# Patient Record
Sex: Male | Born: 1945
Health system: Southern US, Community
[De-identification: ages and names within clinical notes are randomized; demographics above are authoritative.]

## PROBLEM LIST (undated history)

## (undated) ENCOUNTER — Emergency Department (HOSPITAL_COMMUNITY): Discharge status: Absent without leave | Payer: No Typology Code available for payment source

## (undated) DIAGNOSIS — H269 Unspecified cataract: Secondary | ICD-10-CM

## (undated) DIAGNOSIS — G43909 Migraine, unspecified, not intractable, without status migrainosus: Secondary | ICD-10-CM

## (undated) DIAGNOSIS — K449 Diaphragmatic hernia without obstruction or gangrene: Secondary | ICD-10-CM

## (undated) DIAGNOSIS — T8859XA Other complications of anesthesia, initial encounter: Secondary | ICD-10-CM

## (undated) DIAGNOSIS — K219 Gastro-esophageal reflux disease without esophagitis: Secondary | ICD-10-CM

## (undated) DIAGNOSIS — I680 Cerebral amyloid angiopathy: Secondary | ICD-10-CM

## (undated) DIAGNOSIS — K5792 Diverticulitis of intestine, part unspecified, without perforation or abscess without bleeding: Secondary | ICD-10-CM

## (undated) DIAGNOSIS — D649 Anemia, unspecified: Secondary | ICD-10-CM

## (undated) DIAGNOSIS — K5790 Diverticulosis of intestine, part unspecified, without perforation or abscess without bleeding: Secondary | ICD-10-CM

## (undated) DIAGNOSIS — Z860101 Personal history of adenomatous and serrated colon polyps: Secondary | ICD-10-CM

## (undated) DIAGNOSIS — E039 Hypothyroidism, unspecified: Secondary | ICD-10-CM

## (undated) DIAGNOSIS — M1990 Unspecified osteoarthritis, unspecified site: Secondary | ICD-10-CM

## (undated) DIAGNOSIS — K222 Esophageal obstruction: Secondary | ICD-10-CM

## (undated) DIAGNOSIS — Z86010 Personal history of colonic polyps: Secondary | ICD-10-CM

## (undated) DIAGNOSIS — T4145XA Adverse effect of unspecified anesthetic, initial encounter: Secondary | ICD-10-CM

## (undated) HISTORY — PX: POLYPECTOMY: SHX149

## (undated) HISTORY — DX: Diverticulitis of intestine, part unspecified, without perforation or abscess without bleeding: K57.92

## (undated) HISTORY — PX: MENISCUS REPAIR: SHX5179

## (undated) HISTORY — DX: Unspecified cataract: H26.9

## (undated) HISTORY — DX: Unspecified osteoarthritis, unspecified site: M19.90

## (undated) HISTORY — DX: Hypothyroidism, unspecified: E03.9

## (undated) HISTORY — PX: SHOULDER SURGERY: SHX246

## (undated) HISTORY — DX: Personal history of colonic polyps: Z86.010

## (undated) HISTORY — DX: Migraine, unspecified, not intractable, without status migrainosus: G43.909

## (undated) HISTORY — PX: KNEE SURGERY: SHX244

## (undated) HISTORY — PX: CATARACT EXTRACTION: SUR2

## (undated) HISTORY — DX: Gastro-esophageal reflux disease without esophagitis: K21.9

## (undated) HISTORY — DX: Esophageal obstruction: K22.2

## (undated) HISTORY — DX: Diverticulosis of intestine, part unspecified, without perforation or abscess without bleeding: K57.90

## (undated) HISTORY — DX: Personal history of adenomatous and serrated colon polyps: Z86.0101

## (undated) HISTORY — DX: Diaphragmatic hernia without obstruction or gangrene: K44.9

## (undated) HISTORY — DX: Anemia, unspecified: D64.9

## (undated) HISTORY — PX: UPPER GASTROINTESTINAL ENDOSCOPY: SHX188

## (undated) HISTORY — PX: COLONOSCOPY: SHX174

---

## 2003-05-08 ENCOUNTER — Ambulatory Visit (HOSPITAL_COMMUNITY): Admission: RE | Admit: 2003-05-08 | Discharge: 2003-05-08 | Payer: Self-pay | Admitting: Orthopedic Surgery

## 2003-05-08 ENCOUNTER — Ambulatory Visit: Admission: RE | Admit: 2003-05-08 | Discharge: 2003-05-08 | Payer: Self-pay | Admitting: Orthopedic Surgery

## 2003-05-16 ENCOUNTER — Ambulatory Visit (HOSPITAL_COMMUNITY): Admission: RE | Admit: 2003-05-16 | Discharge: 2003-05-16 | Payer: Self-pay | Admitting: Orthopedic Surgery

## 2003-05-16 ENCOUNTER — Ambulatory Visit (HOSPITAL_BASED_OUTPATIENT_CLINIC_OR_DEPARTMENT_OTHER): Admission: RE | Admit: 2003-05-16 | Discharge: 2003-05-16 | Payer: Self-pay | Admitting: Orthopedic Surgery

## 2005-05-04 ENCOUNTER — Encounter: Admission: RE | Admit: 2005-05-04 | Discharge: 2005-05-04 | Payer: Self-pay | Admitting: Family Medicine

## 2005-05-04 ENCOUNTER — Encounter (INDEPENDENT_AMBULATORY_CARE_PROVIDER_SITE_OTHER): Payer: Self-pay | Admitting: *Deleted

## 2005-06-01 ENCOUNTER — Encounter (INDEPENDENT_AMBULATORY_CARE_PROVIDER_SITE_OTHER): Payer: Self-pay | Admitting: *Deleted

## 2005-06-01 ENCOUNTER — Ambulatory Visit (HOSPITAL_COMMUNITY): Admission: RE | Admit: 2005-06-01 | Discharge: 2005-06-01 | Payer: Self-pay | Admitting: *Deleted

## 2006-09-16 ENCOUNTER — Ambulatory Visit (HOSPITAL_COMMUNITY): Admission: RE | Admit: 2006-09-16 | Discharge: 2006-09-16 | Payer: Self-pay | Admitting: *Deleted

## 2006-09-16 ENCOUNTER — Encounter (INDEPENDENT_AMBULATORY_CARE_PROVIDER_SITE_OTHER): Payer: Self-pay | Admitting: *Deleted

## 2006-09-16 ENCOUNTER — Encounter: Payer: Self-pay | Admitting: Internal Medicine

## 2007-07-01 ENCOUNTER — Encounter: Admission: RE | Admit: 2007-07-01 | Discharge: 2007-07-01 | Payer: Self-pay | Admitting: Orthopedic Surgery

## 2008-07-12 ENCOUNTER — Encounter: Admission: RE | Admit: 2008-07-12 | Discharge: 2008-07-12 | Payer: Self-pay | Admitting: Orthopedic Surgery

## 2008-09-18 ENCOUNTER — Ambulatory Visit (HOSPITAL_COMMUNITY): Admission: RE | Admit: 2008-09-18 | Discharge: 2008-09-18 | Payer: Self-pay | Admitting: Endocrinology

## 2008-09-18 ENCOUNTER — Encounter (INDEPENDENT_AMBULATORY_CARE_PROVIDER_SITE_OTHER): Payer: Self-pay | Admitting: *Deleted

## 2009-12-08 ENCOUNTER — Encounter (INDEPENDENT_AMBULATORY_CARE_PROVIDER_SITE_OTHER): Payer: Self-pay | Admitting: *Deleted

## 2009-12-31 ENCOUNTER — Ambulatory Visit: Payer: Self-pay | Admitting: Internal Medicine

## 2009-12-31 DIAGNOSIS — Z860100 Personal history of colon polyps, unspecified: Secondary | ICD-10-CM | POA: Insufficient documentation

## 2009-12-31 DIAGNOSIS — K222 Esophageal obstruction: Secondary | ICD-10-CM

## 2009-12-31 DIAGNOSIS — K5732 Diverticulitis of large intestine without perforation or abscess without bleeding: Secondary | ICD-10-CM

## 2009-12-31 DIAGNOSIS — K219 Gastro-esophageal reflux disease without esophagitis: Secondary | ICD-10-CM

## 2010-01-27 ENCOUNTER — Ambulatory Visit: Payer: Self-pay | Admitting: Internal Medicine

## 2010-01-29 ENCOUNTER — Encounter: Payer: Self-pay | Admitting: Internal Medicine

## 2010-04-21 NOTE — Op Note (Signed)
Summary: EGD-Dr. Virginia Rochester  NAME:  Eric Case, Eric Case                ACCOUNT NO.:  [id number]   MEDICAL RECORD NO.:  [id number]          PATIENT TYPE:  AMB   LOCATION:  ENDO                         FACILITY:  [facility]   PHYSICIAN:  Georgiana Spinner, M.D.    DATE OF BIRTH:  [birth year]   DATE OF PROCEDURE:  06/01/2005  DATE OF DISCHARGE:                                 OPERATIVE REPORT   PROCEDURE:  Upper endoscopy with dilation.   INDICATIONS:  Dysphagia with abnormal barium swallow study   ANESTHESIA:  Demerol 70 Versed 7 mg.   DESCRIPTION OF PROCEDURE:  With the patient mildly sedated in the left  lateral decubitus position, the Olympus videoscopic endoscope was inserted  and passed under direct vision through the esophagus which appeared normal  until we reached the distal esophagus and there was a stricture of the  distal esophagus noted above a hiatal hernia. Additionally there was some  erythematous changes consistent with esophagitis and some erosions seen in  this area as well as 3 or 4 pseudodiverticula. This was all captured in a  photograph. We advanced through this area with the endoscope into the hiatal  hernia sac and distally fundus, body, and antrum were visualized, at which  point, the endoscope was placed in retroflexion to view the stomach from  below. The endoscope was then straightened and advanced through the pylorus  into the duodenal bulb and second portion of the duodenum. From this point,  the endoscope was slowly withdrawn taking circumferential views of duodenal  mucosa until the endoscope had been pulled back into the stomach; at which  point, a guidewire was passed; the endoscope was withdrawn.  Using  fluoroscopic control, a 16 Savary dilator was passed rather easily with it.  The guidewire was then removed. The endoscope was then reinserted to this  level; and a small amount of blood was seen in the esophagus as to be  expected biopsy; and a biopsy was taken of the  squamocolumnar junction. The  endoscope was withdrawn. The patient's vital signs and pulse oximeter  remained stable. The patient tolerated the procedure well without apparent  complications.   FINDINGS:  Distal esophageal stricture above a hiatal hernia with  pseudodiverticula and inflammatory changes noted.   PLAN:  We will have the patient on a liquid diet, for today, and advance as  tolerated. We had placed the patient on over-the-counter PPI therapy,  and  will continue that until I see him back in the office in about 6 weeks, and  will probably continue that indefinitely.  We will await biopsy reports and  clinical response.           ______________________________  Georgiana Spinner, M.D.     GMO/MEDQ  D:  06/01/2005  T:  06/02/2005  Job:  [id number]   cc:   Quita Skye. Artis Flock, M.D.  Fax: [phone]

## 2010-04-21 NOTE — Op Note (Signed)
Summary: Colonoscopy-Dr. Virginia Rochester  NAME:  Eric Case, Eric Case                ACCOUNT NO.:  [id number]      MEDICAL RECORD NO.:  [id number]          PATIENT TYPE:  AMB      LOCATION:  ENDO                         [facility]ACILITY:  [facility]      PHYSICIAN:  Georgiana Spinner, M.D.    DATE OF BIRTH:  08-03-1945      DATE OF PROCEDURE:  09/16/2006   DATE OF DISCHARGE:                                  OPERATIVE REPORT      PROCEDURE:  Colonoscopy.      INDICATIONS:  Colon cancer screening, colon polyps.      ANESTHESIA:  Demerol 75 mg, Versed 5 mg.      DESCRIPTION OF PROCEDURE:  With the patient mildly sedated in the left   lateral decubitus position, a rectal examination was performed.   Prostate was normal to my examination.  Subsequently, the Pentax   videoscopic colonoscope was inserted into the rectum and passed under   direct vision to the cecum, identified by ileocecal valve and   appendiceal orifice, both of which were photographed.  From this point,   the colonoscope was slowly withdrawn, taking circumferential views of   the colonic mucosa, stopping only first at the ascending colon near the   hepatic flexure where a small polyp was seen, photographed and removed   using hot biopsy forceps technique setting of 20/150 blended current.   We next stopped at the splenic flexure where a second polyp was seen.   It too was photographed and it was removed using snare cautery technique   with the same setting.  Tissue was retrieved by suctioning through the   endoscope into a tissue trap.  From this point, the colonoscope was then   slowly withdrawn taking circumferential views of the remaining colonic   mucosa stopping only to photograph diverticula that we saw the sigmoid   colon on the way to the rectum which appeared normal on direct and   showed hemorrhoids on retroflexed view.  The endoscope was straightened   and withdrawn.  The patient's vital signs and pulse oximeter remained   stable.  The patient  tolerated the procedure well without apparent   complication.      FINDINGS:   1. Internal hemorrhoids.   2. Diverticulosis of sigmoid colon, mild.   3. Polyp at splenic flexure and ascending colon.      PLAN:  Await biopsy report.  The patient will call me for results and   follow-up with me as needed as an outpatient.                  ______________________________   Georgiana Spinner, M.D.            GMO/MEDQ  D:  09/16/2006  T:  09/16/2006  Job:  [id number]      cc:   Quita Skye. Artis Flock, M.D.   Fax: [phone]

## 2010-04-21 NOTE — Letter (Signed)
Summary: New Patient letter  [facility]  [address] [place], [place] [address]   Phone: [phone]  Fax: [phone]       12/08/2009 MRN: [id number]  Eric Case [name] [place], [place]  [address]  Dear Eric Case,  Welcome to the Gastroenterology Division at [facility].    You are scheduled to see Dr.  Yancey Flemings on December 31, 2009 at 9:15am on the 3rd floor at [facility], [address]  We ask that you try to arrive at our office 15 minutes prior to your appointment time to allow for check-in.  We would like you to complete the enclosed self-administered evaluation form prior to your visit and bring it with you on the day of your appointment.  We will review it with you.  Also, please bring a complete list of all your medications or, if you prefer, bring the medication bottles and we will list them.  Please bring your insurance card so that we may make a copy of it.  If your insurance requires a referral to see a specialist, please bring your referral form from your primary care physician.  Co-payments are due at the time of your visit and may be paid by cash, check or credit card.     Your office visit will consist of a consult with your physician (includes a physical exam), any laboratory testing he/she may order, scheduling of any necessary diagnostic testing (e.g. x-ray, ultrasound, CT-scan), and scheduling of a procedure (e.g. Endoscopy, Colonoscopy) if required.  Please allow enough time on your schedule to allow for any/all of these possibilities.    If you cannot keep your appointment, please call [phone] to cancel or reschedule prior to your appointment date.  This allows [place] the opportunity to schedule an appointment for another patient in need of care.  If you do not cancel or reschedule by 5 p.m. the business day prior to your appointment date, you will be charged a $50.00 late cancellation/no-show fee.    Thank you for  choosing [facility] for your medical needs.  We appreciate the opportunity to care for you.  Please visit [place] at our website  to learn more about our practice.                     Sincerely,                                                             The Gastroenterology Division

## 2010-04-21 NOTE — Letter (Signed)
Summary: Patient Notice- Polyp Results  [facility]  [address] [place], [place] [address]   Phone: [phone]  Fax: [phone]        January 29, 2010 MRN: [id number]    Eric Case [name] [place], [place]  [address]    Dear [name],  I am pleased to inform you that the colon polyp(s) removed during your recent colonoscopy was (were) found to be benign (no cancer detected) upon pathologic examination.  I recommend you have a repeat colonoscopy examination in 5 years to look for recurrent polyps, as having colon polyps increases your risk for having recurrent polyps or even colon cancer in the future.  Should you develop new or worsening symptoms of abdominal pain, bowel habit changes or bleeding from the rectum or bowels, please schedule an evaluation with either your primary care physician or with me.  Additional information/recommendations:  __ No further action with gastroenterology is needed at this time. Please      follow-up with your primary care physician for your other healthcare      needs.    Please call us if you are having persistent problems or have questions about your condition that have not been fully answered at this time.  Sincerely,  Hilarie Fredrickson MD  This letter has been electronically signed by your physician.  Appended Document: Patient Notice- Polyp Results letter mailed 11.15.2011

## 2010-04-21 NOTE — Letter (Signed)
Summary: [facility] Instructions  [facility]  [address] [place], [place] [address]   Phone: [phone]  Fax: [phone]       Eric Case    June 19, 1945    MRN: [id number]        Procedure Day /Date:TUESDAY, 01/27/10     Arrival Time:9:00 AM     Procedure Time:10:00 AM     Location of Procedure:                    X  [facility] (4th Floor)                        PREPARATION FOR COLONOSCOPY WITH MOVIPREP   Starting 5 days prior to your procedure 01/22/10 do not eat nuts, seeds, popcorn, corn, beans, peas,  salads, or any raw vegetables.  Do not take any fiber supplements (e.g. Metamucil, Citrucel, and Benefiber).  THE DAY BEFORE YOUR PROCEDURE         DATE: 01/26/10 ONG:EXBMWU  1.  Drink clear liquids the entire day-NO SOLID FOOD  2.  Do not drink anything colored red or purple.  Avoid juices with pulp.  No orange juice.  3.  Drink at least 64 oz. (8 glasses) of fluid/clear liquids during the day to prevent dehydration and help the prep work efficiently.  CLEAR LIQUIDS INCLUDE: Water Jello Ice Popsicles Tea (sugar ok, no milk/cream) Powdered fruit flavored drinks Coffee (sugar ok, no milk/cream) Gatorade Juice: apple, white grape, white cranberry  Lemonade Clear bullion, consomm, broth Carbonated beverages (any kind) Strained chicken noodle soup Hard Candy                             4.  In the morning, mix first dose of MoviPrep solution:    Empty 1 Pouch A and 1 Pouch B into the disposable container    Add lukewarm drinking water to the top line of the container. Mix to dissolve    Refrigerate (mixed solution should be used within 24 hrs)  5.  Begin drinking the prep at 5:00 p.m. The MoviPrep container is divided by 4 marks.   Every 15 minutes drink the solution down to the next mark (approximately 8 oz) until the full liter is complete.   6.  Follow completed prep with 16 oz of clear liquid of your choice (Nothing red or  purple).  Continue to drink clear liquids until bedtime.  7.  Before going to bed, mix second dose of MoviPrep solution:    Empty 1 Pouch A and 1 Pouch B into the disposable container    Add lukewarm drinking water to the top line of the container. Mix to dissolve    Refrigerate  THE DAY OF YOUR PROCEDURE      DATE: 01/27/10 DAY: TUESDAY  Beginning at 5:00 a.m. (5 hours before procedure):         1. Every 15 minutes, drink the solution down to the next mark (approx 8 oz) until the full liter is complete.  2. Follow completed prep with 16 oz. of clear liquid of your choice.    3. You may drink clear liquids until 8:00 AM (2 HOURS BEFORE PROCEDURE).   MEDICATION INSTRUCTIONS  Unless otherwise instructed, you should take regular prescription medications with a small sip of water   as early as possible the morning of your procedure.  OTHER INSTRUCTIONS  You will need a responsible adult at least 63 years of age to accompany you and drive you home.   This person must remain in the waiting room during your procedure.  Wear loose fitting clothing that is easily removed.  Leave jewelry and other valuables at home.  However, you may wish to bring a book to read or  an iPod/MP3 player to listen to music as you wait for your procedure to start.  Remove all body piercing jewelry and leave at home.  Total time from sign-in until discharge is approximately 2-3 hours.  You should go home directly after your procedure and rest.  You can resume normal activities the  day after your procedure.  The day of your procedure you should not:   Drive   Make legal decisions   Operate machinery   Drink alcohol   Return to work  You will receive specific instructions about eating, activities and medications before you leave.    The above instructions have been reviewed and explained to me by   _______________________    I fully understand and can verbalize these  instructions _____________________________ Date _________

## 2010-04-21 NOTE — Procedures (Signed)
Summary: Colonoscopy  Patient: [name] Note: All result statuses are Final unless otherwise noted.  Tests: (1) Colonoscopy (COL)   COL Colonoscopy           DONE     [facility]     [address]     [place], [place]  [address]           COLONOSCOPY PROCEDURE REPORT           PATIENT:  Eric Case, Eric Case  MR#:  [id number]     BIRTHDATE:  06/29/1945, 64 yrs. old  GENDER:  male     ENDOSCOPIST:  Wilhemina Bonito. Eda Keys, MD     REF. BY:  Lavada Mesi, M.D.     PROCEDURE DATE:  01/27/2010     PROCEDURE:  Colonoscopy with snare polypectomy x 2     ASA CLASS:  Class II     INDICATIONS:  history of pre-cancerous (adenomatous) colon polyps,     surveillance and high-risk screening index exam 08-2006 (Dr Virginia Rochester) w/     small adenomas     MEDICATIONS:   Fentanyl 75 mcg IV, Versed 7 mg IV           DESCRIPTION OF PROCEDURE:   After the risks benefits and     alternatives of the procedure were thoroughly explained, informed     consent was obtained.  Digital rectal exam was performed and     revealed no abnormalities.   The [id number] endoscope was     introduced through the anus and advanced to the cecum, which was     identified by both the appendix and ileocecal valve, without     limitations.Time to cecum = 2:32 min.  The quality of the prep was     excellent, using MoviPrep.  The instrument was then slowly     withdrawn (time = 14:48 min) as the colon was fully examined.     <<PROCEDUREIMAGES>>           FINDINGS:  Two polyps (2mm, 7mm) were found in the ascending     colon. Polyps were snared without cautery. Retrieval was     successful.   Severe diverticulosis was found throughout the     colon, especially the left colon.   Retroflexed views in the     rectum revealed internal hemorrhoids.    The scope was then     withdrawn from the patient and the procedure completed.           COMPLICATIONS:  None           ENDOSCOPIC IMPRESSION:     1) Two polyps in the ascending colon  -removed     2) Severe diverticulosis throughout the colon     3) Internal hemorrhoids           RECOMMENDATIONS:     1) Follow up colonoscopy in 5 years           ______________________________     Wilhemina Bonito. Eda Keys, MD           CC:  Lavada Mesi, MDThe Patient           n.     Rosalie DoctorWilhemina Bonito. Eda Keys at 01/27/2010 11:21 AM           Roxan Diesel, [id number]  Note: An exclamation mark (!) indicates a result that was not dispersed into the flowsheet. Document Creation Date: 01/27/2010 11:22 AM  _______________________________________________________________________  (1) Order result status: Final Collection or observation date-time: 01/27/2010 11:11 Requested date-time:  Receipt date-time:  Reported date-time:  Referring Physician:   Ordering Physician: Fransico Setters [phone]) Specimen Source:  Source: Launa Grill Order Number: [id number] Lab site:   Appended Document: Colonoscopy recall 5 yrs     Procedures Next Due Date:    Colonoscopy: 01/2015

## 2010-04-21 NOTE — Assessment & Plan Note (Signed)
Summary: Establish with GI (history of polyps, diverticulitis, GERD)   History of Present Illness Visit Type: Initial Visit Primary GI MD: Yancey Flemings MD Primary Provider: Lavada Mesi, MD Chief Complaint: Patient here to discuss having a colonoscopy. He c/o episodes of LLQ abdominal pain every 2-3 months which has been treated with antibiotics by Dr Jorge Mandril.  History of Present Illness:   65 year old white male with a history of GERD complicated by peptic stricture, hypothyroidism, recurrent diverticulitis, and adenomatous colon polyps. He presents today with questions regarding recurrent diverticulitis as well as the need for followup colonoscopy. Previously under the care of Dr. Sabino Gasser. Review of outside records finds that the patient underwent upper endoscopy with esophageal dilation in March of 2007 for problems with dysphagia. Found to have a peptic stricture, esophagitis, and a pseudodiverticulum. Dilated to a maximal diameter of 16 mm. Has been on lansoprazole since. No problems with reflux symptoms were recurrent dysphagia. Next, he reports recurrent bouts of diverticulitis as manifested by left-sided abdominal pain. He describes about 6 such episodes over the past 3 years. Initially diagnosed with CT scan when he went to see his urologist for possible kidney stone. He generally responds to antibiotics within 2-3 days. His last colonoscopy was performed in June of 2008. He was said to have internal hemorrhoids, sigmoid diverticulosis, as well as several polyps in the splenic flexure and descending colon regions. These were adenomatous. He was instructed to followup in 3 years. Finally, he mentions having low iron levels. Review of outside laboratories finds a low ferritin level in May of 2010 with normal ferritin level January 2000 and and low ferritin level June 2011. However, normal hemoglobin 14.4. He is now taking an iron supplement.   GI Review of Systems    Reports abdominal pain,  belching, and  bloating.     Location of  Abdominal pain: LLQ.    Denies acid reflux, chest pain, dysphagia with liquids, dysphagia with solids, heartburn, loss of appetite, nausea, vomiting, vomiting blood, weight loss, and  weight gain.      Reports diverticulosis.     Denies anal fissure, black tarry stools, change in bowel habit, constipation, diarrhea, fecal incontinence, heme positive stool, hemorrhoids, irritable bowel syndrome, jaundice, light color stool, liver problems, rectal bleeding, and  rectal pain. Preventive Screening-Counseling & Management  Alcohol-Tobacco     Smoking Status: never  Caffeine-Diet-Exercise     Does Patient Exercise: yes      Drug Use:  no.      Current Medications (verified): 1)  Levothyroxine Sodium 112 Mcg Tabs (Levothyroxine Sodium) .... Take 1 Tablet By Mouth Once Daily 2)  Lansoprazole 30 Mg Cpdr (Lansoprazole) .... Take 1 Tablet By Mouth Once A Day 3)  Ferrous Gluconate 325 (36 Fe) Mg Tabs (Ferrous Gluconate) .... Take 1 Tablet By Mouth Once A Day 4)  Multivitamins  Tabs (Multiple Vitamin) .... Take 1 Tablet By Mouth Once A Day 5)  Vitamin C 1000 Mg Tabs (Ascorbic Acid) .... Take 1 Tablet By Mouth Once A Day 6)  Vitamin E 400 Unit Caps (Vitamin E) .... Take 1 Tablet By Mouth Once A Day 7)  Saw Palmetto 250 Mg Capsule .... Take 2 Capsules By Mouth Once Daily 8)  Salmon Oil-1000 200 Mg Caps (Omega-3 Fatty Acids) .... Take 1 Capsule By Mouth Once A Day 9)  Glucosamine 1500 Complex  Caps (Glucosamine-Chondroit-Vit C-Mn) .... Take 1 Capsule By Mouth Once A Day 10)  Msm 1000 Mg Caps (Methylsulfonylmethane) .... Take  1 Capsule By Mouth Once A Day  Allergies (verified): 1)  ! Cipro 2)  ! Septra 3)  ! Percocet 4)  ! * Pensed  Past History:  Past Medical History: Adenomatous Colon Polyps Diverticulosis Hemorrhoids Hiatal Hernia Arthritis GERD Hypothyroidism Hx Diverticulitis  Past Surgical History: Bilateral shoulder surgeries  Family  History: Family History of Breast Cancer: Mother Family History of Colon Cancer:1st cousin Family History of Liver Cancer: Father Family History of Prostate Cancer: Father, Maternal Uncles x 3  Social History: Occupation: Music therapist Patient has never smoked.  Alcohol Use - no Daily Caffeine Use-1 cup daily Illicit Drug Use - no Patient gets regular exercise. Smoking Status:  never Drug Use:  no Does Patient Exercise:  yes  Review of Systems       The patient complains of arthritis/joint pain and back pain.  The patient denies allergy/sinus, anemia, anxiety-new, blood in urine, breast changes/lumps, change in vision, confusion, cough, coughing up blood, depression-new, fainting, fatigue, fever, headaches-new, hearing problems, heart murmur, heart rhythm changes, itching, menstrual pain, muscle pains/cramps, night sweats, nosebleeds, pregnancy symptoms, shortness of breath, skin rash, sleeping problems, sore throat, swelling of feet/legs, swollen lymph glands, thirst - excessive , urination - excessive , urination changes/pain, urine leakage, vision changes, and voice change.    Vital Signs:  Patient profile:   65 year old male Height:      69 inches Weight:      197.13 pounds BMI:     29.22 BSA:     2.05 Pulse rate:   92 / minute Pulse rhythm:   regular BP sitting:   118 / 82  (right arm)  Vitals Entered By: Lamona Curl CMA Duncan Dull) (December 31, 2009 9:26 AM)  Physical Exam  General:  Well developed, well nourished, no acute distress. Head:  Normocephalic and atraumatic. Eyes:  PERRLA, no icterus. Nose:  No deformity, discharge,  or lesions. Mouth:  No deformity or lesions. Neck:  Supple; no masses or thyromegaly. Lungs:  Clear throughout to auscultation. Heart:  Regular rate and rhythm; no murmurs, rubs,  or bruits. Abdomen:  Soft, nontender and nondistended. No masses, hepatosplenomegaly or hernias noted. Normal bowel sounds. Rectal:  deferred until  colonoscopy Msk:  Symmetrical with no gross deformities. Normal posture. Pulses:  Normal pulses noted. Extremities:  No clubbing, cyanosis, edema or deformities noted. Neurologic:  Alert and  oriented x4;  grossly normal neurologically. Skin:  Intact without significant lesions or rashes. Psych:  Alert and cooperative. Normal mood and affect.   Impression & Recommendations:  Problem # 1:  PERSONAL HISTORY OF COLONIC POLYPS (ICD-V12.72) personal history of adenomatous colon polyps. Last colonoscopy June 2008. Due for followup.  Plan #1. Colonoscopy. The nature of the procedure as well as the risks, benefits, and alternatives were reviewed. He understood and agreed to proceed #2. Movi prep prescribed. The patient instructed on its use  Problem # 2:  GERD (ICD-530.81) history of GERD complicated by peptic stricture. Currently asymptomatic post dilation on lansoprazole  Plan: #1. Continue lansoprazole #2. Reflux precautions #3. Repeat esophageal dilation p.r.n. recurrent dysphagia  Problem # 3:  ESOPHAGEAL STRICTURE (ICD-530.3) status post dilation. please see #2 above.   Problem # 4:  DIVERTICULITIS OF COLON (ICD-562.11) recurrent bouts of uncomplicated diverticulitis. We discussed today, in some detail, diverticular disease. We also discussed evidence regarding dietary manipulation. Also discussed the role for surgery in some instances.  Plan: #1. High-fiber diet #2. Literature on diverticular disease to complement today's discussion #3. See medical  attention for treatment of symptoms suggesting diverticular flare, as you have in the past  Other Orders: Colonoscopy (Colon)  Patient Instructions: 1)  Colonoscopy [facility] 01/27/10 10:00 am arrive at 9:00 am 2)  Movi prep instructions given to patient. 3)  Movi prep Rx. sent to pharmacy. 4)  Colonoscopy and Flexible Sigmoidoscopy brochure given. \par 5)  Diverticular Disease Brochure given. 6)  Copy sent to : Lavada Mesi, MD 7)   The medication list was reviewed and reconciled.  All changed / newly prescribed medications were explained.  A complete medication list was provided to the patient / caregiver. Prescriptions: MOVIPREP 100 GM  SOLR (PEG-KCL-NACL-NASULF-NA ASC-C) As per prep instructions.  #1 x 0   Entered by:   Milford Cage NCMA   Authorized by:   Hilarie Fredrickson MD   Signed by:   Milford Cage NCMA on 12/31/2009   Method used:   Electronically to        [facility]* (retail)       [address]       [place], [place]  [address]       Ph: [phone]       Fax: [phone]   RxID:   [id number]

## 2010-05-29 ENCOUNTER — Encounter: Payer: Self-pay | Admitting: Nurse Practitioner

## 2010-05-29 ENCOUNTER — Telehealth: Payer: Self-pay | Admitting: Internal Medicine

## 2010-05-29 ENCOUNTER — Ambulatory Visit (INDEPENDENT_AMBULATORY_CARE_PROVIDER_SITE_OTHER): Payer: PRIVATE HEALTH INSURANCE | Admitting: Nurse Practitioner

## 2010-05-29 DIAGNOSIS — K5732 Diverticulitis of large intestine without perforation or abscess without bleeding: Secondary | ICD-10-CM

## 2010-06-02 NOTE — Progress Notes (Signed)
Summary: Triage - Diverticulitis flare...  Phone Note Call from Patient Call back at Home Phone [phone] Call back at [phone]   Caller: Patient Call For: Dr. Marina Goodell Reason for Call: Talk to Nurse Summary of Call: Patient is having a flare up of diverticulitis and usually gets medication from his family doctor but he is out of the office and wanted to see if Dr. Marina Goodell could call him something in to  [facility] Initial call taken by: [place] Johnson,  May 29, 2010 9:44 AM  Follow-up for Phone Call        Patient states that he is having a diverticulitis flare and that his PCP usually calls in an antibiotic for this. His PCP is out of the office and he is requesting that we call in an antibiotic for this. Dr. Marina Goodell please advise. Follow-up by: Selinda Michaels RN,  May 29, 2010 10:33 AM  Additional Follow-up for Phone Call Additional follow up Details #1::        Have him see an extender today, then we can treat if appropriate Additional Follow-up by: Hilarie Fredrickson MD,  May 29, 2010 12:17 PM    Additional Follow-up for Phone Call Additional follow up Details #2::    pt to come in and see Gunnar Fusi st 2pm today. Follow-up by: Harlow Mares CMA Duncan Dull),  May 29, 2010 12:43 PM

## 2010-06-09 NOTE — Assessment & Plan Note (Signed)
Summary: Diverticulitis Flare   History of Present Illness Visit Type: Follow-up Visit Primary GI MD: Yancey Flemings MD Primary Provider: Lavada Mesi, MD Chief Complaint: Possible Diverticulitis flare History of Present Illness:   65 year old white male followed by Dr. Marina Goodell for a history of GERD complicated by peptic stricture, recurrent diverticulitis, and adenomatous colon polyps. First episode of diverticultis was a few years ago when patient saw urology for flank pain and a CTscan (urogram) showed diverticulitis. Three weeks ago he was treated for another episode. Apparently took 7 days of Augmentin. Pain improved but didn't completely resolve. No fevers. He generally has 2-3 solid BMs a day during attacks and this is the case now. Otherwise BMs are okay. Weight stable.    GI Review of Systems    Reports abdominal pain.     Location of  Abdominal pain: LLQ.    Denies acid reflux, belching, bloating, chest pain, dysphagia with liquids, dysphagia with solids, heartburn, loss of appetite, nausea, vomiting, vomiting blood, weight loss, and  weight gain.      Reports diverticulosis.     Denies anal fissure, black tarry stools, change in bowel habit, constipation, diarrhea, fecal incontinence, heme positive stool, hemorrhoids, irritable bowel syndrome, jaundice, light color stool, liver problems, rectal bleeding, and  rectal pain.    Current Medications (verified): 1)  Levothyroxine Sodium 112 Mcg Tabs (Levothyroxine Sodium) .... Take 1 Tablet By Mouth Once Daily 2)  Lansoprazole 30 Mg Cpdr (Lansoprazole) .... Take 1 Tablet By Mouth Once A Day 3)  Ferrous Gluconate 325 (36 Fe) Mg Tabs (Ferrous Gluconate) .... Take 1 Tablet By Mouth Once A Day 4)  Multivitamins  Tabs (Multiple Vitamin) .... Take 1 Tablet By Mouth Once A Day 5)  Vitamin C 1000 Mg Tabs (Ascorbic Acid) .... Take 1 Tablet By Mouth Once A Day 6)  Vitamin E 400 Unit Caps (Vitamin E) .... Take 1 Tablet By Mouth Once A Day 7)  Saw  Palmetto 250 Mg Capsule .... Take 2 Capsules By Mouth Once Daily 8)  Salmon Oil-1000 200 Mg Caps (Omega-3 Fatty Acids) .... Take 1 Capsule By Mouth Once A Day 9)  Glucosamine 1500 Complex  Caps (Glucosamine-Chondroit-Vit C-Mn) .... Take 1 Capsule By Mouth Once A Day 10)  Msm 1000 Mg Caps (Methylsulfonylmethane) .... Take 1 Capsule By Mouth Once A Day  Allergies (verified): 1)  ! Cipro 2)  ! Septra 3)  ! Percocet 4)  ! 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  Alcohol Use - no Daily Caffeine Use-1 cup daily Illicit Drug Use - no Patient gets regular exercise.  Family History: Reviewed history from 12/31/2009 and no changes required. Family History of Breast Cancer: Mother Family History of Colon Cancer:1st cousin Family History of Liver Cancer: Father Family History of Prostate Cancer: Father, Maternal Uncles x 3  Social History: Reviewed history from 12/31/2009 and no changes required. 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  Alcohol Use - no Daily Caffeine Use-1 cup daily Illicit Drug Use - no Patient gets regular exercise.  Review of Systems  The patient  denies allergy/sinus, anemia, anxiety-new, arthritis/joint pain, back pain, blood in urine, breast changes/lumps, change in vision, confusion, cough, coughing up blood, depression-new, fainting, fatigue, fever, headaches-new, hearing problems, heart murmur, heart  rhythm changes, itching, menstrual pain, muscle pains/cramps, night sweats, nosebleeds, pregnancy symptoms, shortness of breath, skin rash, sleeping problems, sore throat, swelling of feet/legs, swollen lymph glands, thirst - excessive , urination - excessive , urination changes/pain, urine leakage, vision changes, and voice change.    Vital Signs:  Patient profile:   66 year old male Height:      69 inches Weight:      201 pounds BMI:     29.79 BSA:     2.07 Pulse rate:   80 / minute Pulse rhythm:   regular BP sitting:   128 / 84  (left arm)  Vitals Entered By: Merri Ray CMA Duncan Dull) (May 29, 2010 2:09 PM)  Physical Exam  General:  Well developed, well nourished, no acute distress. Head:  Normocephalic and atraumatic. Eyes:  Conjunctiva pink, no icterus.  Neck:  Conjunctiva pink, no icterus.  Lungs:  Clear throughout to auscultation. Heart:  Regular rate and rhythm; no murmurs, rubs,  or bruits. Abdomen:  Abdomen soft, mild-moderate LLQ tenderness, nondistended. No obvious masses or hepatomegaly.Normal bowel sounds.  Msk:  Symmetrical with no gross deformities. Normal posture. Neurologic:  Alert and  oriented x4;  grossly normal neurologically. Skin:  Intact without significant lesions or rashes. Cervical Nodes:  No significant cervical adenopathy. Psych:  Alert and cooperative. Normal mood and affect.   Impression & Recommendations:  Problem # 1:  DIVERTICULITIS OF COLON (ICD-562.11) Assessment Deteriorated  I  requested and received a copy of [facility]'s 2007 CT scan documenting sigmoid diverticulitis..Suspect his LLQ pain is due to recurrent sigmoid diverticulitis and will retreat with Augmentin.  If symptoms don't improve will obtain CTscan. At some point he may need surgical evaluation for recurrent episodes.   Patient Instructions: 1)  We sent a prescription for Augmentin to your pharmacy, [facility]. 2)  Low Residue Brochure given, stay on that diet  for 3-4 days. Gradually advance you diet to a normal diet. 3)  We made you a follow up appointmenmt  with Dr. Marina Goodell on 07-01-2010 at 10 AM.  4)  Appointment card given. 5)  When finishing Antibiotic , if not feeling normal call us at [phone]. 6)  Copy sent to : Dr Lavada Mesi, MD 7)  The medication list was reviewed and reconciled.  All changed / newly prescribed medications were explained.  A complete medication list was provided to the patient / caregiver. Prescriptions: AUGMENTIN 875-125 MG TABS (AMOXICILLIN-POT CLAVULANATE) Take 1 tab twice daily x 10 days  #20 x 0   Entered by:   Lowry Ram NCMA   Authorized by:   Willette Cluster NP   Signed by:   Lowry Ram NCMA on 05/29/2010   Method used:   Electronically to        [facility]* (retail)       [address]       [place], [place]  [address]       Ph: [phone]       Fax: [phone]   RxID:   [id number]

## 2010-06-18 ENCOUNTER — Encounter: Payer: Self-pay | Admitting: Nurse Practitioner

## 2010-07-01 ENCOUNTER — Encounter: Payer: Self-pay | Admitting: Internal Medicine

## 2010-07-01 ENCOUNTER — Ambulatory Visit (INDEPENDENT_AMBULATORY_CARE_PROVIDER_SITE_OTHER): Payer: PRIVATE HEALTH INSURANCE | Admitting: Internal Medicine

## 2010-07-01 VITALS — BP 138/80 | HR 56 | Ht 69.0 in | Wt 197.0 lb

## 2010-07-01 DIAGNOSIS — K219 Gastro-esophageal reflux disease without esophagitis: Secondary | ICD-10-CM

## 2010-07-01 DIAGNOSIS — K222 Esophageal obstruction: Secondary | ICD-10-CM

## 2010-07-01 DIAGNOSIS — K5732 Diverticulitis of large intestine without perforation or abscess without bleeding: Secondary | ICD-10-CM

## 2010-07-01 DIAGNOSIS — Z86010 Personal history of colonic polyps: Secondary | ICD-10-CM

## 2010-07-01 DIAGNOSIS — R1032 Left lower quadrant pain: Secondary | ICD-10-CM

## 2010-07-01 NOTE — Progress Notes (Signed)
HISTORY OF PRESENT ILLNESS:  Eric Case is a 65 y.o. male with the below listed medical problems who presents today for followup. GI problems include a personal history of adenomatous colon polyps, GERD complicated by peptic stricture, and diverticulosis complicated by recurrent bouts of diverticulitis. The patient's last colonoscopy was performed in November 2011. Several small adenomas removed. Severe pandiverticulosis identified. Incidental internal hemorrhoids. In early March, he developed left lower quadrant pain reminiscent of prior bouts of diverticulitis. Was treated with a one-week course of antibiotics by his PCP. Because of ongoing complaints, he was seen by our nurse practitioner 06/09/2010. He was treated with additional course of Augmentin. This followup appointment scheduled. The patient reports that his diverticular-type pain has resolved. He has multiple questions regarding diverticular disease and diverticulitis. He also reports to me intermittent sharp pain in the left flank region, noticed after yard work. Describes this as a "stitch". Concern that maybe something serious. GI review of systems is otherwise entirely negative. He continues on PPI for his GERD.  REVIEW OF SYSTEMS:  All non-GI ROS negative except for Chronic back pain  Past Medical History  Diagnosis Date  . Hx of adenomatous colonic polyps   . Diverticulosis   . Hemorrhoids   . Hiatal hernia   . Arthritis   . GERD (gastroesophageal reflux disease)   . Hypothyroidism   . Diverticulitis     Past Surgical History  Procedure Date  . Shoulder surgery     bilateral    Social History EDUARDO HONOR  reports that he has never smoked. He does not have any smokeless tobacco history on file. He reports that he does not drink alcohol or use illicit drugs.  family history includes Breast cancer in his mother; Colon cancer in his cousin; Liver cancer in his father; and Prostate cancer in his father.  Allergies    Allergen Reactions  . Ciprofloxacin     REACTION: chest pain  . Oxycodone-Acetaminophen     REACTION: itching  . Pennsaid   . Sulfamethoxazole W/Trimethoprim     REACTION: rash       PHYSICAL EXAMINATION:  Vital signs: BP 138/80  Pulse 56  Ht 5\' 9"  (1.753 m)  Wt 197 lb (89.359 kg)  BMI 29.09 kg/m2 General: Well-developed, well-nourished, no acute distress HEENT: Sclerae are anicteric, conjunctiva pink. Oral mucosa intact Lungs: Clear Heart: Regular Abdomen: soft, nontender, nondistended, no obvious ascites, no peritoneal signs, normal bowel sounds. No organomegaly. Extremities: No edema Psychiatric: alert and oriented x3. Cooperative     ASSESSMENT:  #1. Recurrent diverticulitis. Bout has resolved after appropriate antibiotic therapy.  #2. Intermittent sharp flank pain. Most consistent with musculoskeletal discomfort. We discussed options regarding further diagnosis, including CT imaging  #3. GERD complicated by peptic stricture. He remains asymptomatic post dilation on PPI  #4. Adenomatous colon polyps on most recent colonoscopy in November 2011. Due for followup around November 2016.   PLAN:  #1. Long discussion today on diverticular disease, diverticulitis (complicated and uncomplicated), and management strategies.  #2. Offered CT scan to evaluate the left-sided pain that is concerning him. However, he wanted to wait to see if it would resolve before committing to the cost of a CT scan. He has not had a CT scan in 3 years. He knows to contact the office if the discomfort persists or worsens such that a scan may be arranged  #3. Continue reflux precautions and PPI  #4. Keep surveillance colonoscopy for November 2016  #5. Routine GI  followup in one year. Sooner for questions or problems.

## 2010-08-04 NOTE — Op Note (Signed)
NAME:  Eric Case, Eric Case                ACCOUNT NO.:  [id number]   MEDICAL RECORD NO.:  [id number]          PATIENT TYPE:  AMB   LOCATION:  ENDO                         FACILITY:  [facility]   PHYSICIAN:  Georgiana Spinner, M.D.    DATE OF BIRTH:  [birth year]   DATE OF PROCEDURE:  09/16/2006  DATE OF DISCHARGE:                               OPERATIVE REPORT   PROCEDURE:  Colonoscopy.   INDICATIONS:  Colon cancer screening, colon polyps.   ANESTHESIA:  Demerol 75 mg, Versed 5 mg.   DESCRIPTION OF PROCEDURE:  With the patient mildly sedated in the left  lateral decubitus position, a rectal examination was performed.  Prostate was normal to my examination.  Subsequently, the Pentax  videoscopic colonoscope was inserted into the rectum and passed under  direct vision to the cecum, identified by ileocecal valve and  appendiceal orifice, both of which were photographed.  From this point,  the colonoscope was slowly withdrawn, taking circumferential views of  the colonic mucosa, stopping only first at the ascending colon near the  hepatic flexure where a small polyp was seen, photographed and removed  using hot biopsy forceps technique setting of 20/150 blended current.  We next stopped at the splenic flexure where a second polyp was seen.  It too was photographed and it was removed using snare cautery technique  with the same setting.  Tissue was retrieved by suctioning through the  endoscope into a tissue trap.  From this point, the colonoscope was then  slowly withdrawn taking circumferential views of the remaining colonic  mucosa stopping only to photograph diverticula that we saw the sigmoid  colon on the way to the rectum which appeared normal on direct and  showed hemorrhoids on retroflexed view.  The endoscope was straightened  and withdrawn.  The patient's vital signs and pulse oximeter remained  stable.  The patient tolerated the procedure well without apparent  complication.   FINDINGS:  1. Internal hemorrhoids.  2. Diverticulosis of sigmoid colon, mild.  3. Polyp at splenic flexure and ascending colon.   PLAN:  Await biopsy report.  The patient will call me for results and  follow-up with me as needed as an outpatient.           ______________________________  Georgiana Spinner, M.D.     GMO/MEDQ  D:  09/16/2006  T:  09/16/2006  Job:  [id number]   cc:   Quita Skye. Artis Flock, M.D.  Fax: [phone]

## 2010-08-07 NOTE — Op Note (Signed)
NAME:  Eric Case, Eric Case                          ACCOUNT NO.:  [id number]   MEDICAL RECORD NO.:  [id number]                   PATIENT TYPE:  AMB   LOCATION:  DSC                                  FACILITY:  [facility]   PHYSICIAN:  Rodney A. Chaney Malling, M.D.           DATE OF BIRTH:  1945/05/07   DATE OF PROCEDURE:  05/16/2003  DATE OF DISCHARGE:                                 OPERATIVE REPORT   PREOPERATIVE DIAGNOSIS:  Rotator cuff tear, right shoulder with retraction.   POSTOPERATIVE DIAGNOSIS:  Large retracted rotator cuff tear of right  shoulder, common tear of anterior glenoid labrum, partial tear of  subscapularis of right shoulder.   PROCEDURE:  Arthroscopy; arthroscopic debridement of frayed and torn  anterior labrum; arthroscopic debridement of partial tear of subscapularis  of right shoulder; open acromioplasty and mini open repair of retracted  rotator cuff tear on the right using one Mitek anchor.   SURGEON:  Lenard Galloway. Chaney Malling, M.D.   ANESTHESIA:  General.   DESCRIPTION OF PROCEDURE:  The patient was placed on the operative table in  the supine position. After satisfactory oral endotracheal anesthesia the  patient was placed in the semi-seated position. The right upper extremity  was prepped with Duraprep and draped out in the usual manner.  Through a  standard posterior portal the arthroscope was introduced.  A very careful  examination of the shoulder was undertaken.  The articular cartilage over  the humeral head and the glenoid was absent and normal.  There was some  fraying of the leading edge of the anterior labrum, but the labrum was  stable.  There was significant tearing of the subscapularis, but not a  complete rupture of the subscapularis noted.  The anchor of the biceps  superiorly was stable and appeared normal.  The biceps itself appeared  normal.  There was a large hole in the supraspinatus with fraying and  tearing of the supraspinatus and retraction of the  supraspinatus.  Through  an anterior portal and operative shaver was introduced.  The anterior  glenoid labrum was debrided as was the torn portion of the subscapularis.  The undersurface of the supraspinatus above the biceps tendon was also  debrided.  The arthroscope was then removed from the glenohumeral joint and  placed in the subacromial bursa.  The subacromial bursa was partially  debrided with the intra-articular shaver.  A larger tactic tear could be  seen and in fact the supraspinatus had retracted proximally enough where the  biceps was exposed to the subacromial space.  The arthroscope was then  removed.   A saber cut incision was made over the anterolateral aspect of the right  shoulder. The skin edges were retracted.  The deltoid fibers were released  off the anterior aspect of the acromion only. A small acromioplasty was then  done.  Excellent access to the shoulder joint was achieved.  Throughout the  procedure  the shoulder was irrigated with copious amounts of antibiotic  solution. The foot pad of the attachment of the supraspinatus was debrided  with a rongeur.  A Mitek anchor was inserted.  Sutures were then passed  through the retracted supraspinatus which could be pulled down into an  anatomic position and an excellent repair over the original foot pad was  achieved.  A watertight closure was achieved.  Again, the shoulder was  irrigated with copious amounts of antibiotic solution. The deltoid fibers  were reattached with 0 Vicryl and 2-0 Vicryl was used to close the  subcutaneous tissues. Stainless steel staples were used to close the skin.  Sterile dressings were applied and the patient returned to the recovery room  in excellent position.  Technically this procedure went extremely well.   DRAINS:  None.   COMPLICATIONS:  None.                                               Rodney A. Chaney Malling, M.D.    RAM/MEDQ  D:  05/16/2003  T:  05/16/2003  Job:  [id number]

## 2010-08-07 NOTE — Op Note (Signed)
NAME:  Eric Case, Eric Case                ACCOUNT NO.:  [id number]   MEDICAL RECORD NO.:  [id number]          PATIENT TYPE:  AMB   LOCATION:  ENDO                         FACILITY:  [facility]   PHYSICIAN:  Georgiana Spinner, M.D.    DATE OF BIRTH:  [birth year]   DATE OF PROCEDURE:  06/01/2005  DATE OF DISCHARGE:                                 OPERATIVE REPORT   PROCEDURE:  Upper endoscopy with dilation.   INDICATIONS:  Dysphagia with abnormal barium swallow study   ANESTHESIA:  Demerol 70 Versed 7 mg.   DESCRIPTION OF PROCEDURE:  With the patient mildly sedated in the left  lateral decubitus position, the Olympus videoscopic endoscope was inserted  and passed under direct vision through the esophagus which appeared normal  until we reached the distal esophagus and there was a stricture of the  distal esophagus noted above a hiatal hernia. Additionally there was some  erythematous changes consistent with esophagitis and some erosions seen in  this area as well as 3 or 4 pseudodiverticula. This was all captured in a  photograph. We advanced through this area with the endoscope into the hiatal  hernia sac and distally fundus, body, and antrum were visualized, at which  point, the endoscope was placed in retroflexion to view the stomach from  below. The endoscope was then straightened and advanced through the pylorus  into the duodenal bulb and second portion of the duodenum. From this point,  the endoscope was slowly withdrawn taking circumferential views of duodenal  mucosa until the endoscope had been pulled back into the stomach; at which  point, a guidewire was passed; the endoscope was withdrawn.  Using  fluoroscopic control, a 16 Savary dilator was passed rather easily with it.  The guidewire was then removed. The endoscope was then reinserted to this  level; and a small amount of blood was seen in the esophagus as to be  expected biopsy; and a biopsy was taken of the squamocolumnar junction.  The  endoscope was withdrawn. The patient's vital signs and pulse oximeter  remained stable. The patient tolerated the procedure well without apparent  complications.   FINDINGS:  Distal esophageal stricture above a hiatal hernia with  pseudodiverticula and inflammatory changes noted.   PLAN:  We will have the patient on a liquid diet, for today, and advance as  tolerated. We had placed the patient on over-the-counter PPI therapy,  and  will continue that until I see him back in the office in about 6 weeks, and  will probably continue that indefinitely.  We will await biopsy reports and  clinical response.           ______________________________  Georgiana Spinner, M.D.     GMO/MEDQ  D:  06/01/2005  T:  06/02/2005  Job:  [id number]   cc:   Quita Skye. Artis Flock, M.D.  Fax: [phone]

## 2011-01-26 ENCOUNTER — Other Ambulatory Visit: Payer: Self-pay | Admitting: Family Medicine

## 2011-01-26 DIAGNOSIS — M25561 Pain in right knee: Secondary | ICD-10-CM

## 2011-01-30 ENCOUNTER — Ambulatory Visit
Admission: RE | Admit: 2011-01-30 | Discharge: 2011-01-30 | Disposition: A | Discharge status: Home / Self Care | Payer: PRIVATE HEALTH INSURANCE | Source: Ambulatory Visit | Attending: Family Medicine | Admitting: Family Medicine

## 2011-01-30 DIAGNOSIS — M25561 Pain in right knee: Secondary | ICD-10-CM

## 2011-04-19 ENCOUNTER — Telehealth: Payer: Self-pay | Admitting: Internal Medicine

## 2011-04-19 NOTE — Telephone Encounter (Signed)
Pt has diverticulitis flare in April and was treated by his PCP with antibiotics and he improved. Saw Willette Cluster NP 06/09/10 and followed up with Dr. Marina Goodell 07/01/10. Pt states his diet over the holidays was not good. Pt states he is having a diverticulitis flare, he has been having left lower quadrant abdominal pain for about 2 weeks. He states it is not severe and he has had no fever. He notices the discomfort mostly when he strains or is picking something up. Pt is requesting some antibiotics to help with his symptoms. Dr. Marina Goodell please advise.

## 2011-04-19 NOTE — Telephone Encounter (Signed)
I have reviewed his old record. He has different types of left-sided pain. Since he reports this pain as being not severe and no fever, have him seen by an extended this week in the office. This would be best to determine if he truly needs antibiotic therapy

## 2011-04-19 NOTE — Telephone Encounter (Signed)
Pt scheduled to see Willette Cluster NP 04/21/11@8 :30am. Pt aware of appt date and time.

## 2011-04-21 ENCOUNTER — Ambulatory Visit (INDEPENDENT_AMBULATORY_CARE_PROVIDER_SITE_OTHER): Payer: PRIVATE HEALTH INSURANCE | Admitting: Nurse Practitioner

## 2011-04-21 ENCOUNTER — Other Ambulatory Visit (INDEPENDENT_AMBULATORY_CARE_PROVIDER_SITE_OTHER): Payer: PRIVATE HEALTH INSURANCE

## 2011-04-21 ENCOUNTER — Encounter: Payer: Self-pay | Admitting: Nurse Practitioner

## 2011-04-21 VITALS — BP 122/70 | HR 80 | Ht 69.0 in | Wt 198.2 lb

## 2011-04-21 DIAGNOSIS — K5790 Diverticulosis of intestine, part unspecified, without perforation or abscess without bleeding: Secondary | ICD-10-CM

## 2011-04-21 DIAGNOSIS — K219 Gastro-esophageal reflux disease without esophagitis: Secondary | ICD-10-CM

## 2011-04-21 DIAGNOSIS — IMO0001 Reserved for inherently not codable concepts without codable children: Secondary | ICD-10-CM

## 2011-04-21 DIAGNOSIS — M7918 Myalgia, other site: Secondary | ICD-10-CM

## 2011-04-21 DIAGNOSIS — D649 Anemia, unspecified: Secondary | ICD-10-CM

## 2011-04-21 DIAGNOSIS — K5730 Diverticulosis of large intestine without perforation or abscess without bleeding: Secondary | ICD-10-CM

## 2011-04-21 LAB — CBC WITH DIFFERENTIAL/PLATELET
Eosinophils Relative: 3.3 % (ref 0.0–5.0)
Lymphocytes Relative: 20.8 % (ref 12.0–46.0)
MCV: 87.2 fl (ref 78.0–100.0)
Monocytes Absolute: 0.9 10*3/uL (ref 0.1–1.0)
Neutrophils Relative %: 65.4 % (ref 43.0–77.0)
Platelets: 318 10*3/uL (ref 150.0–400.0)
RBC: 5.08 Mil/uL (ref 4.22–5.81)
WBC: 9.2 10*3/uL (ref 4.5–10.5)

## 2011-04-21 MED ORDER — CYCLOBENZAPRINE HCL 5 MG PO TABS: 5.0000 mg | ORAL_TABLET | Freq: Three times a day (TID) | ORAL | Status: AC | PRN

## 2011-04-21 NOTE — Patient Instructions (Signed)
Please go to the basement level to have your labs drawn.  We sent  A prescription to [facility], [place], [place]. Flexeril, take as directed. Also take Ibuprofen 200 mg, 2 tablets twice daily. We have given you Reflux disease diet information.   Call us to let us know after the 7 days of taking the Flexeril,and Ibuprofen  let us know if it helped.

## 2011-04-21 NOTE — Progress Notes (Signed)
Eric Case [id number] March 28, 1945   HISTORY OR PRESENT ILLNESS : Patient is a 66 year old male known to Dr. Marina Goodell for history of GERD, diverticulitis and adenomatous colon polyps.Patient was last treated for diverticulitis in March 2012. Except for a couple of "little flare ups" he had been doing well since his last visit here in April 2012. Three weeks ago however, patient developed left flank pain which, over the following few days, migrated around to LLQ. He is still having the LLQ discomfort but it is mainly associated with twisting or other physical activities.  No dysuria. No fevers. Bowel movements are okay. Eating yogurt and a lot of fiber.  Overall he is feeling better now and was actually hesitant to keep today appointment. Over the last few weeks patient has been doing a lot of physical labor, he wonders if he may have a pulled muscle.  From a GERd standpoint patient doing fairly well except for  occasional acid regurgitation during the night. Usually doesn't eat after 6pm. At present, HOB not elevated.   Patient on iron twice weekly under direction of Dr. Jorge Mandril. Patient states his hemoglobin was really low at one time. He occasionally donates blood.   Current Medications, Allergies, Past Medical History, Past Surgical History, Family History and Social History were reviewed in [employer] record.   PHYSICAL EXAMINATION : General: Well developed white male no acute distress Head: Normocephalic and atraumatic Eyes:  sclerae anicteric,conjunctive pink. Ears: Normal auditory acuity Neck: Supple, no masses.  Lungs: Clear throughout to auscultation Heart: Regular rate and rhythm; no murmurs heard Abdomen: Soft, nondistended, nontender, + Carnett's sign (left oblique). No hepatomegaly noted. Normal bowel sounds Rectal: not done Musculoskeletal: Symmetrical with no gross deformities, +Carnett's.  Skin: No lesions on visible extremities Extremities: No edema or  deformities noted Neurological: Oriented x 4, grossly nonfocal Cervical Nodes:  No significant cervical adenopathy Psychological:  Alert and cooperative. Normal mood and affect  ASSESSMENT AND PLAN :  1 Left side / LLQ pain, started in left back a couple of weeks ago. Pain mainly associated with twisting and other physical activities. Suspect this is musculoskeletal pain, not recurrent diverticulitis, especially since patient has been doing a lot of physical labor lately. Will try low dose Flexeril and ibuprofen 400 bid, both for 7 days. Patient will call us in a few days with condition update. 2. GERD, on daily PPI but having occasional regurgiation at night. Continue PPI, elevate HOB. Anti-reflux brochure given.  3 Anemia, per patient.  He is on iron at home. No overt GI bleeding. I do not have any labs from PCP's office, Will obtain a CBC today.

## 2011-04-22 ENCOUNTER — Encounter: Payer: Self-pay | Admitting: Nurse Practitioner

## 2011-04-22 NOTE — Progress Notes (Signed)
PT KNOWN TO ME.  AGREE WITH INITIAL ASSESSMENT AND PLAN

## 2011-04-28 ENCOUNTER — Telehealth: Payer: Self-pay | Admitting: Internal Medicine

## 2011-04-28 NOTE — Telephone Encounter (Signed)
Pt called and left a message advising he is doing better and he doesn't feel he needs a CT scan at this time.  He said he will call back if he has any more trouble and will have the CT scan if we think he needs one.

## 2011-05-10 ENCOUNTER — Telehealth: Payer: Self-pay | Admitting: Internal Medicine

## 2011-05-10 DIAGNOSIS — R1032 Left lower quadrant pain: Secondary | ICD-10-CM

## 2011-05-10 NOTE — Telephone Encounter (Signed)
Patient c/o continued LLQ pain.  He reports that the pain never completely resolved after tx of flexeril and ibuprofen.  He reports for the last few days he has constant LLQ pain.  Pain is worse with physical activity especially twisting.  He denies constipation, nausea or vomiting, rectal bleeding, fever, or other GI complaints.  I reviewed office note from Willette Cluster RNP on 04/21/11 symptoms are unchanged at the time.  Per the office note it was thought his symptoms were "musculoskeletal pain, not recurrent diverticulitis" . He would like to schedule an office visit with Dr Marina Goodell.  Dr Marina Goodell should he see you?  You do have an opening tomorrow at 4pm if you feel he should see GI. Dr Marina Goodell please advise.

## 2011-05-10 NOTE — Telephone Encounter (Signed)
AT THIS POINT, I WOULD PROCEED WITH CONTRAST CT OF THE ABDOMEN AND PELVIS TO FURTHER EVALUATE PERSISTING LLQ PAIN. WE CAN GO FROM THERE.Marland KitchenMarland Kitchen

## 2011-05-10 NOTE — Telephone Encounter (Signed)
Patient advised of Dr Lamar Sprinkles recommendations.  He is scheduled for CT scan at [facility] office for 05/12/11.  He is asked to come pick up his contrast and instructions by tomorrow.  I have reviewed the instructions over the phone.  He verbalized understanding.  He will call back for any questions or further problems.

## 2011-05-12 ENCOUNTER — Ambulatory Visit (INDEPENDENT_AMBULATORY_CARE_PROVIDER_SITE_OTHER)
Admission: RE | Admit: 2011-05-12 | Discharge: 2011-05-12 | Disposition: A | Discharge status: Home / Self Care | Payer: PRIVATE HEALTH INSURANCE | Source: Ambulatory Visit | Attending: Internal Medicine | Admitting: Internal Medicine

## 2011-05-12 ENCOUNTER — Telehealth: Payer: Self-pay | Admitting: Internal Medicine

## 2011-05-12 DIAGNOSIS — R1032 Left lower quadrant pain: Secondary | ICD-10-CM

## 2011-05-12 MED ORDER — IOHEXOL 300 MG/ML  SOLN
100.0000 mL | Freq: Once | INTRAMUSCULAR | Status: AC | PRN
  Administered 2011-05-12: 100 mL via INTRAVENOUS

## 2011-05-12 NOTE — Telephone Encounter (Signed)
See result note.  

## 2012-06-08 ENCOUNTER — Telehealth: Payer: Self-pay | Admitting: Internal Medicine

## 2012-06-08 ENCOUNTER — Encounter: Payer: Self-pay | Admitting: Nurse Practitioner

## 2012-06-08 ENCOUNTER — Ambulatory Visit (INDEPENDENT_AMBULATORY_CARE_PROVIDER_SITE_OTHER): Payer: PRIVATE HEALTH INSURANCE | Admitting: Nurse Practitioner

## 2012-06-08 VITALS — BP 128/86 | HR 88 | Ht 69.0 in | Wt 194.0 lb

## 2012-06-08 DIAGNOSIS — K5732 Diverticulitis of large intestine without perforation or abscess without bleeding: Secondary | ICD-10-CM

## 2012-06-08 MED ORDER — AMOXICILLIN-POT CLAVULANATE 875-125 MG PO TABS: 1.0000 | ORAL_TABLET | Freq: Two times a day (BID) | ORAL | Status: AC

## 2012-06-08 NOTE — Patient Instructions (Addendum)
1. A prescription for antibiotics will be sent to your pharmacy. Please complete all of the antibiotics, ( Augmentin 875/125 mg)   2.Followup with Dr. Marina Goodell on 07-03-2012 @ 1:30 PM.   3.Call our office for worsening pain and/or fevers.

## 2012-06-08 NOTE — Progress Notes (Signed)
06/08/2012 Eric Case [id number] January 23, 1946   History of Present Illness:  Patient is a 67 year old male known to Dr. Marina Goodell for a history of adenomatous colon polyps,GERD complicated by peptic stricture and diverticulosis complicated by recurrent bouts of diverticulitis. I saw patient in January 2013 for left lower quadrant pain. Findings at that time seemed to suggest musculoskeletal pain. Patient was treated with NSAIDs and Flexeril. Symptoms improved but did not resolve. Patient called the office, CT scan was ordered. No evidence for diverticulitis was seen. Patient's current pain is different than that of January 2013. This pain is definitely reminiscent of diverticulitis. Though pain is worse with movement it is not dependent on physical activity and is not related to meals or defecation. Bowel movements are okay.   Current Medications, Allergies, Past Medical History, Past Surgical History, Family History and Social History were reviewed in [employer] record.   Physical Exam: General: Well developed , white male in no acute distress Head: Normocephalic and atraumatic Eyes:  sclerae anicteric, conjunctiva pink  Ears: Normal auditory acuity Lungs: Clear throughout to auscultation Heart: Regular rate and rhythm Abdomen: Soft, non distended, mild left lower quadrant tenderness. No masses, no hepatomegaly. Normal bowel sounds Musculoskeletal: Symmetrical with no gross deformities  Extremities: No edema  Neurological: Alert oriented x 4, grossly nonfocal Psychological:  Alert and cooperative. Normal mood and affect  Assessment and Recommendations:   left lower quadrant pain, suspect recurrent diverticulitis. Patient has not had an episode of diverticulitis in over one year. Will treat with course of Augmentin. Patient will follow with Dr. Marina Goodell in a few weeks. He knows to call in the interim for worsening pain and/or fever.

## 2012-06-08 NOTE — Telephone Encounter (Signed)
Pt states he has been having problems with left lower quadrant pain for about 3-4 weeks. Pt states he thought it would get better but it has not. Thinks it may be a flare of his diverticulitis. Pt scheduled to see Willette Cluster NP today at 2pm. Pt aware of appt date and time.

## 2012-06-09 ENCOUNTER — Encounter: Payer: Self-pay | Admitting: Nurse Practitioner

## 2012-06-11 NOTE — Progress Notes (Signed)
Agree with initial assessment and plans 

## 2012-07-03 ENCOUNTER — Encounter: Payer: Self-pay | Admitting: Internal Medicine

## 2012-07-03 ENCOUNTER — Ambulatory Visit (INDEPENDENT_AMBULATORY_CARE_PROVIDER_SITE_OTHER): Payer: PRIVATE HEALTH INSURANCE | Admitting: Internal Medicine

## 2012-07-03 VITALS — BP 128/82 | HR 78 | Ht 69.0 in | Wt 189.0 lb

## 2012-07-03 DIAGNOSIS — K5732 Diverticulitis of large intestine without perforation or abscess without bleeding: Secondary | ICD-10-CM

## 2012-07-03 DIAGNOSIS — Z86010 Personal history of colonic polyps: Secondary | ICD-10-CM

## 2012-07-03 DIAGNOSIS — K222 Esophageal obstruction: Secondary | ICD-10-CM

## 2012-07-03 DIAGNOSIS — K219 Gastro-esophageal reflux disease without esophagitis: Secondary | ICD-10-CM

## 2012-07-03 NOTE — Patient Instructions (Addendum)
Please follow up with Dr. Perry as needed 

## 2012-07-03 NOTE — Progress Notes (Signed)
HISTORY OF PRESENT ILLNESS:  Eric Case is a 67 y.o. male with GERD complicated by peptic stricture and diverticulosis with a history of diverticulitis. He was seen in a few weeks ago with left lower quadrant pain felt represented diverticular flare. As placed on Augmentin. After about 6 days, his symptoms resolved. He currently feels well. GI review of systems is negative at present. He continues to do well on Prevacid. No dysphagia.  REVIEW OF SYSTEMS:  All non-GI ROS negative   Past Medical History  Diagnosis Date  . Hx of adenomatous colonic polyps   . Diverticulosis   . Hemorrhoids   . Hiatal hernia   . Arthritis   . GERD (gastroesophageal reflux disease)   . Hypothyroidism   . Diverticulitis   . Peptic stricture of esophagus     Past Surgical History  Procedure Laterality Date  . Shoulder surgery      bilateral    Social History Eric Case  reports that he has never smoked. He has never used smokeless tobacco. He reports that he does not drink alcohol or use illicit drugs.  family history includes Breast cancer in his mother; Colon cancer in his cousin; Liver cancer in his father; and Prostate cancer in his father and maternal uncles.  Allergies  Allergen Reactions  . Ciprofloxacin     REACTION: chest pain  . Diclofenac Sodium   . Oxycodone-Acetaminophen     REACTION: itching  . Pennsaid (Diclofenac Sodium)   . Sulfamethoxazole W-Trimethoprim     REACTION: rash       PHYSICAL EXAMINATION: Vital signs: BP 128/82  Pulse 78  Ht 5\' 9"  (1.753 m)  Wt 189 lb (85.73 kg)  BMI 27.9 kg/m2 General: Well-developed, well-nourished, no acute distress HEENT: Sclerae are anicteric, conjunctiva pink. Oral mucosa intact Lungs: Clear Heart: Regular Abdomen: soft, nontender, nondistended, no obvious ascites, no peritoneal signs, normal bowel sounds. No organomegaly. Extremities: No edema Psychiatric: alert and oriented x3. Cooperative     ASSESSMENT:  #1.  Diverticulitis. Resolved #2. GERD complicated by peptic stricture. Currently asymptomatic #3. History of adenomatous polyps  PLAN:  #1. High-fiber diet #2. Continue PPI #3. Due for surveillance colonoscopy around November 2016. Interval Followup as needed

## 2012-07-12 ENCOUNTER — Telehealth: Payer: Self-pay | Admitting: Internal Medicine

## 2012-07-12 MED ORDER — AMOXICILLIN-POT CLAVULANATE 875-125 MG PO TABS: 1.0000 | ORAL_TABLET | Freq: Two times a day (BID) | ORAL | Status: DC

## 2012-07-12 NOTE — Telephone Encounter (Signed)
Pt aware and script sent to the pharmacy. 

## 2012-07-12 NOTE — Telephone Encounter (Signed)
Let us go ahead and treat him with a course of Augmentin 875 mg twice a day x10 days. If he has recurrent problems after that, we may need to consider a CT scan. Thanks

## 2012-07-12 NOTE — Telephone Encounter (Signed)
Spoke with pt and he states that he started taking antibiotics for diverticulitis 06/08/12, he finished the meds the end of March. States the pain went away totally for a few days but the last 3 days he has started having pain again. Pt states the pain is in his abdomen along the beltline in the left lower quadrant. Pt thinks his diverticulitis symptoms are coming back. Please advise.

## 2012-07-12 NOTE — Telephone Encounter (Signed)
Bonita Quin, please call this patient and gather some clinical information for me please, thank you

## 2012-12-07 ENCOUNTER — Ambulatory Visit (HOSPITAL_COMMUNITY)
Admission: RE | Admit: 2012-12-07 | Discharge: 2012-12-07 | Disposition: A | Discharge status: Home / Self Care | Payer: No Typology Code available for payment source | Source: Ambulatory Visit | Attending: Family Medicine | Admitting: Family Medicine

## 2012-12-07 ENCOUNTER — Other Ambulatory Visit (HOSPITAL_COMMUNITY): Payer: Self-pay | Admitting: Family Medicine

## 2012-12-07 DIAGNOSIS — M79609 Pain in unspecified limb: Secondary | ICD-10-CM

## 2012-12-07 DIAGNOSIS — M25562 Pain in left knee: Secondary | ICD-10-CM

## 2012-12-07 DIAGNOSIS — M7989 Other specified soft tissue disorders: Secondary | ICD-10-CM

## 2012-12-07 NOTE — Progress Notes (Signed)
VASCULAR LAB PRELIMINARY  PRELIMINARY  PRELIMINARY  PRELIMINARY  Left lower extremity venous Doppler completed.    Preliminary report:  There is no DVT or SVT noted in the left lower extremity.  Ayad Nieman, RVT 12/07/2012, 4:23 PM

## 2013-02-13 DIAGNOSIS — E039 Hypothyroidism, unspecified: Secondary | ICD-10-CM | POA: Insufficient documentation

## 2013-02-13 DIAGNOSIS — M1990 Unspecified osteoarthritis, unspecified site: Secondary | ICD-10-CM | POA: Insufficient documentation

## 2013-07-23 ENCOUNTER — Telehealth: Payer: Self-pay | Admitting: Internal Medicine

## 2013-07-23 MED ORDER — AMOXICILLIN-POT CLAVULANATE 875-125 MG PO TABS: 1.0000 | ORAL_TABLET | Freq: Two times a day (BID) | ORAL | Status: DC

## 2013-07-23 NOTE — Telephone Encounter (Signed)
He can restart Augmentin 875 mg twice a day for 10 days.  Call back in 2-3 days if he's not clearly improving

## 2013-07-23 NOTE — Telephone Encounter (Signed)
Pt aware and script sent to pharmacy. 

## 2013-07-23 NOTE — Telephone Encounter (Signed)
Eric Case pt calling, states he is having a diverticulitis flare and would like some meds called in. Pt recently had surgery on his leg and wife states it would be difficult for him to come in for a visit. Pt had flare in April of 2014 and was given Augmentin 875mg  BID for 10days. Dr. Arlyce DiceKaplan as doc of the day please advise.

## 2014-10-08 DIAGNOSIS — Z0000 Encounter for general adult medical examination without abnormal findings: Secondary | ICD-10-CM | POA: Insufficient documentation

## 2014-10-15 DIAGNOSIS — G43009 Migraine without aura, not intractable, without status migrainosus: Secondary | ICD-10-CM | POA: Insufficient documentation

## 2015-01-29 ENCOUNTER — Encounter: Payer: Self-pay | Admitting: Internal Medicine

## 2015-03-28 ENCOUNTER — Encounter: Payer: Self-pay | Admitting: *Deleted

## 2015-03-28 ENCOUNTER — Ambulatory Visit (AMBULATORY_SURGERY_CENTER): Payer: Self-pay | Admitting: *Deleted

## 2015-03-28 VITALS — Ht 68.0 in | Wt 196.0 lb

## 2015-03-28 DIAGNOSIS — Z86010 Personal history of colonic polyps: Secondary | ICD-10-CM

## 2015-03-28 MED ORDER — NA SULFATE-K SULFATE-MG SULF 17.5-3.13-1.6 GM/177ML PO SOLN: 1.0000 | Freq: Once | ORAL | Status: DC

## 2015-03-28 NOTE — Progress Notes (Signed)
No egg or soy allergy known to patient  No issues with past sedation with any surgeries  or procedures, no intubation problems -with first colon had severe headache post procedure  No diet pills per patient  No home 02 use per patient

## 2015-04-11 ENCOUNTER — Ambulatory Visit (AMBULATORY_SURGERY_CENTER): Payer: No Typology Code available for payment source | Admitting: Internal Medicine

## 2015-04-11 ENCOUNTER — Encounter: Payer: Self-pay | Admitting: Internal Medicine

## 2015-04-11 VITALS — BP 124/87 | HR 63 | Temp 97.7°F | Resp 16 | Ht 68.0 in | Wt 196.0 lb

## 2015-04-11 DIAGNOSIS — Z86010 Personal history of colonic polyps: Secondary | ICD-10-CM

## 2015-04-11 MED ORDER — SODIUM CHLORIDE 0.9 % IV SOLN: 500.0000 mL | INTRAVENOUS | Status: DC

## 2015-04-11 NOTE — Progress Notes (Signed)
Patient awakening,vss,report to rn 

## 2015-04-11 NOTE — Patient Instructions (Signed)
  HANDOUT GIVEN ;DIVERTICULOSIS.  YOU HAD AN ENDOSCOPIC PROCEDURE TODAY AT THE [facility]:   Refer to the procedure report that was given to you for any specific questions about what was found during the examination.  If the procedure report does not answer your questions, please call your gastroenterologist to clarify.  If you requested that your care partner not be given the details of your procedure findings, then the procedure report has been included in a sealed envelope for you to review at your convenience later.  YOU SHOULD EXPECT: Some feelings of bloating in the abdomen. Passage of more gas than usual.  Walking can help get rid of the air that was put into your GI tract during the procedure and reduce the bloating. If you had a lower endoscopy (such as a colonoscopy or flexible sigmoidoscopy) you may notice spotting of blood in your stool or on the toilet paper. If you underwent a bowel prep for your procedure, you may not have a normal bowel movement for a few days.  Please Note:  You might notice some irritation and congestion in your nose or some drainage.  This is from the oxygen used during your procedure.  There is no need for concern and it should clear up in a day or so.  SYMPTOMS TO REPORT IMMEDIATELY:   Following lower endoscopy (colonoscopy or flexible sigmoidoscopy):  Excessive amounts of blood in the stool  Significant tenderness or worsening of abdominal pains  Swelling of the abdomen that is new, acute  Fever of 100F or higher  For urgent or emergent issues, a gastroenterologist can be reached at any hour by calling [phone].   DIET: Your first meal following the procedure should be a small meal and then it is ok to progress to your normal diet. Heavy or fried foods are harder to digest and may make you feel nauseous or bloated.  Likewise, meals heavy in dairy and vegetables can increase bloating.  Drink plenty of fluids but you should avoid alcoholic  beverages for 24 hours.  ACTIVITY:  You should plan to take it easy for the rest of today and you should NOT DRIVE or use heavy machinery until tomorrow (because of the sedation medicines used during the test).    FOLLOW UP: Our staff will call the number listed on your records the next business day following your procedure to check on you and address any questions or concerns that you may have regarding the information given to you following your procedure. If we do not reach you, we will leave a message.  However, if you are feeling well and you are not experiencing any problems, there is no need to return our call.  We will assume that you have returned to your regular daily activities without incident.  If any biopsies were taken you will be contacted by phone or by letter within the next 1-3 weeks.  Please call us at [phone] if you have not heard about the biopsies in 3 weeks.    SIGNATURES/CONFIDENTIALITY: You and/or your care partner have signed paperwork which will be entered into your electronic medical record.  These signatures attest to the fact that that the information above on your After Visit Summary has been reviewed and is understood.  Full responsibility of the confidentiality of this discharge information lies with you and/or your care-partner.

## 2015-04-11 NOTE — Op Note (Signed)
[facility] [address] [place], [address]   COLONOSCOPY PROCEDURE REPORT  PATIENT: Eric Case, Eric Case  MR#: [id number] BIRTHDATE: [birth year] , 69  yrs. old GENDER: male ENDOSCOPIST: Roxy Cedar, MD REFERRED BM:[id number] Program Recall PROCEDURE DATE:  04/11/2015 PROCEDURE:   Colonoscopy, surveillance First Screening Colonoscopy - Avg.  risk and is 50 yrs.  old or older - No.  Prior Negative Screening - Now for repeat screening. N/A  History of Adenoma - Now for follow-up colonoscopy & has been > or = to 3 yrs.  Yes hx of adenoma.  Has been 3 or more years since last colonoscopy.  Polyps removed today? No Recommend repeat exam, <10 yrs? Yes high risk ASA CLASS:   Class II INDICATIONS:Surveillance due to prior colonic neoplasia and PH Colon Adenoma. Index examination 2008 and follow-up 2011. Multiple adenomatous polyps. MEDICATIONS: Monitored anesthesia care and Propofol 200 mg IV  DESCRIPTION OF PROCEDURE:   After the risks benefits and alternatives of the procedure were thoroughly explained, informed consent was obtained.  The digital rectal exam revealed no abnormalities of the rectum.   The LB [id number]  endoscope was introduced through the anus and advanced to the cecum, which was identified by both the appendix and ileocecal valve. No adverse events experienced.   The quality of the prep was excellent. (Suprep was used)  The instrument was then slowly withdrawn as the colon was fully examined. Estimated blood loss is zero unless otherwise noted in this procedure report.    COLON FINDINGS: There was severe diverticulosis noted in the left colon and transverse colon.   The examination was otherwise normal. Retroflexed views revealed internal hemorrhoids. The time to cecum = 2.4 Withdrawal time = 8.8   The scope was withdrawn and the procedure completed. COMPLICATIONS: There were no immediate complications.  ENDOSCOPIC IMPRESSION: 1.    Severe diverticulosis was noted in the left colon and transverse colon 2.   The examination was otherwise normal  RECOMMENDATIONS: 1. Follow up colonoscopy in 5 years  (personal history of multiple adenomatous polyps)  eSigned:  Roxy Cedar, MD 04/11/2015 8:35 AM   cc: The Patient and Lavada Mesi, MD

## 2015-04-14 ENCOUNTER — Telehealth: Payer: Self-pay

## 2015-04-14 NOTE — Telephone Encounter (Signed)
  Follow up Call-  Call back number 04/11/2015  Post procedure Call Back phone  # [phone]  Permission to leave phone message Yes     Patient questions:  Do you have a fever, pain , or abdominal swelling? No. Pain Score  0 *  Have you tolerated food without any problems? Yes.    Have you been able to return to your normal activities? Yes.    Do you have any questions about your discharge instructions: Diet   No. Medications  No. Follow up visit  No.  Do you have questions or concerns about your Care? No.  Actions: * If pain score is 4 or above: No action needed, pain <4.

## 2015-08-06 ENCOUNTER — Ambulatory Visit
Admission: RE | Admit: 2015-08-06 | Discharge: 2015-08-06 | Disposition: A | Discharge status: Home / Self Care | Payer: No Typology Code available for payment source | Source: Ambulatory Visit | Attending: Internal Medicine | Admitting: Internal Medicine

## 2015-08-06 ENCOUNTER — Other Ambulatory Visit: Payer: Self-pay | Admitting: Internal Medicine

## 2015-08-06 DIAGNOSIS — R0602 Shortness of breath: Secondary | ICD-10-CM

## 2015-08-06 MED ORDER — IOPAMIDOL (ISOVUE-370) INJECTION 76%
80.0000 mL | Freq: Once | INTRAVENOUS | Status: AC | PRN
  Administered 2015-08-06: 80 mL via INTRAVENOUS

## 2015-09-24 ENCOUNTER — Other Ambulatory Visit: Payer: Self-pay | Admitting: Dermatology

## 2015-10-20 DIAGNOSIS — C4491 Basal cell carcinoma of skin, unspecified: Secondary | ICD-10-CM | POA: Insufficient documentation

## 2015-10-21 ENCOUNTER — Telehealth: Payer: Self-pay | Admitting: Internal Medicine

## 2015-10-21 NOTE — Telephone Encounter (Signed)
Pt scheduled to see Dr. Marina Goodell 10/23/15@10 :15am. Left message for pt to call back.

## 2015-10-21 NOTE — Telephone Encounter (Signed)
Pt states he saw his PCP and was told to follow-up with GI. States he had workup for chest pain and no cardiac cause found for pain. Pt scheduled to see Dr. Marina Goodell for appt this week and he is aware of the appt.

## 2015-10-23 ENCOUNTER — Ambulatory Visit: Payer: No Typology Code available for payment source | Admitting: Internal Medicine

## 2015-12-16 ENCOUNTER — Encounter: Payer: Self-pay | Admitting: Internal Medicine

## 2015-12-16 ENCOUNTER — Ambulatory Visit (INDEPENDENT_AMBULATORY_CARE_PROVIDER_SITE_OTHER): Payer: BLUE CROSS/BLUE SHIELD | Admitting: Internal Medicine

## 2015-12-16 ENCOUNTER — Encounter (INDEPENDENT_AMBULATORY_CARE_PROVIDER_SITE_OTHER): Payer: Self-pay

## 2015-12-16 VITALS — BP 124/74 | HR 78 | Ht 68.0 in | Wt 194.3 lb

## 2015-12-16 DIAGNOSIS — R0789 Other chest pain: Secondary | ICD-10-CM | POA: Diagnosis not present

## 2015-12-16 DIAGNOSIS — K219 Gastro-esophageal reflux disease without esophagitis: Secondary | ICD-10-CM | POA: Diagnosis not present

## 2015-12-16 NOTE — Progress Notes (Signed)
HISTORY OF PRESENT ILLNESS:  Eric Case is a 70 y.o. male with past medical history as listed below who has been followed in this office for a history of adenomatous colon polyps, diverticulitis, and GERD complicated by peptic stricture. The patient is sent today by his primary care physician Dr. Prince RomeHilts with chief complaint of chest pain occurring after meals. The patient states that his symptoms began May 2017. He describes a sensation of tightness and cough which he notices after meals only when he is in the recumbent position. Upon sitting upright standing his symptoms resolve immediately. He denies dysphagia. He does stay on Prevacid 30 mg daily for GERD. He denies active GERD symptoms. He denies esophageal dysphagia. For his symptoms did undergo CT angiography of the chest to rule out pulmonary embolus back in May. This was negative. As time has gone on he states that the symptoms have slightly improved. His weight is stable. Last upper endoscopy was done remotely. CT scan did demonstrate hiatal hernia which he was told may be the cause for his symptoms. Last colonoscopy January 2017 was severe diverticulosis but otherwise normal. Follow-up in 5 years given the history of multiple adenomatous polyps. Patient's GI review of systems is otherwise remarkable for bloating and belching.  REVIEW OF SYSTEMS:  All non-GI ROS negative except for arthritis  Past Medical History:  Diagnosis Date  . Anemia    on Iron- comes and goes per patient   . Arthritis   . Cataract    starting but very small  . Diverticulitis   . Diverticulosis   . GERD (gastroesophageal reflux disease)   . Hemorrhoids   . Hiatal hernia   . Hx of adenomatous colonic polyps   . Hypothyroidism   . Peptic stricture of esophagus     Past Surgical History:  Procedure Laterality Date  . COLONOSCOPY    . KNEE SURGERY Right    tendon tear   . MENISCUS REPAIR Right    2015 and May 2017  . POLYPECTOMY    . SHOULDER SURGERY      bilateral  . UPPER GASTROINTESTINAL ENDOSCOPY      Social History Eric Case  reports that he has never smoked. He has never used smokeless tobacco. He reports that he does not drink alcohol or use drugs.  family history includes Breast cancer in his mother; Colon cancer in his cousin; Heart disease in his mother; Liver cancer in his father; Prostate cancer in his father, maternal uncle, maternal uncle, and maternal uncle.  Allergies  Allergen Reactions  . Ciprofloxacin     REACTION: chest pain  . Diclofenac Sodium   . Hydromorphone Itching  . Oxycodone-Acetaminophen     REACTION: itching  . Pennsaid [Diclofenac Sodium]   . Sulfamethoxazole-Trimethoprim     REACTION: rash       PHYSICAL EXAMINATION: Vital signs: BP 124/74   Pulse 78   Ht 5\' 8"  (1.727 m)   Wt 194 lb 5 oz (88.1 kg)   BMI 29.55 kg/m   Constitutional: generally well-appearing, no acute distress Psychiatric: alert and oriented x3, cooperative Eyes: extraocular movements intact, anicteric, conjunctiva pink Mouth: oral pharynx moist, no lesions Neck: supple no lymphadenopathy Cardiovascular: heart regular rate and rhythm, no murmur Lungs: clear to auscultation bilaterally Abdomen: soft, nontender, nondistended, no obvious ascites, no peritoneal signs, normal bowel sounds, no organomegaly Rectal:Deferred Extremities: no clubbing cyanosis or lower extremity edema bilaterally Skin: no lesions on visible extremities Neuro: No focal deficits. Cranial nerves  intact  ASSESSMENT:  #1. Atypical chest pain postprandially #2. History of GERD with peptic stricture #3. History of multiple adenomatous colon polyps. Surveillance up-to-date   PLAN:  #1. Reflux precautions #2. Schedule diagnostic upper endoscopy.The nature of the procedure, as well as the risks, benefits, and alternatives were carefully and thoroughly reviewed with the patient. Ample time for discussion and questions allowed. The patient  understood, was satisfied, and agreed to proceed. #3. Consider empiric increased dosage of PPI to twice daily. He wants to wait #4. Surveillance colonoscopy 2022 #5. Ongoing general medical care with PCP  Copies consultation note has been sent to Dr. Prince Rome

## 2015-12-16 NOTE — Patient Instructions (Signed)
You have been scheduled for an endoscopy. Please follow written instructions given to you at your visit today. If you use inhalers (even only as needed), please bring them with you on the day of your procedure.   

## 2015-12-18 ENCOUNTER — Ambulatory Visit (AMBULATORY_SURGERY_CENTER): Payer: BLUE CROSS/BLUE SHIELD | Admitting: Internal Medicine

## 2015-12-18 ENCOUNTER — Encounter: Payer: Self-pay | Admitting: Internal Medicine

## 2015-12-18 VITALS — BP 110/71 | HR 64 | Temp 97.3°F | Resp 16 | Ht 68.0 in | Wt 194.0 lb

## 2015-12-18 DIAGNOSIS — R0789 Other chest pain: Secondary | ICD-10-CM | POA: Diagnosis not present

## 2015-12-18 DIAGNOSIS — K219 Gastro-esophageal reflux disease without esophagitis: Secondary | ICD-10-CM

## 2015-12-18 MED ORDER — SODIUM CHLORIDE 0.9 % IV SOLN: 500.0000 mL | INTRAVENOUS | Status: DC

## 2015-12-18 NOTE — Progress Notes (Signed)
Report to PACU, RN, vss, BBS= Clear.  

## 2015-12-18 NOTE — Patient Instructions (Signed)
GERD handout given. Resume current medications. Call us with any questions or concerns. Thank you!!    YOU HAD AN ENDOSCOPIC PROCEDURE TODAY AT THE [facility]:   Refer to the procedure report that was given to you for any specific questions about what was found during the examination.  If the procedure report does not answer your questions, please call your gastroenterologist to clarify.  If you requested that your care partner not be given the details of your procedure findings, then the procedure report has been included in a sealed envelope for you to review at your convenience later.  YOU SHOULD EXPECT: Some feelings of bloating in the abdomen. Passage of more gas than usual.  Walking can help get rid of the air that was put into your GI tract during the procedure and reduce the bloating. If you had a lower endoscopy (such as a colonoscopy or flexible sigmoidoscopy) you may notice spotting of blood in your stool or on the toilet paper. If you underwent a bowel prep for your procedure, you may not have a normal bowel movement for a few days.  Please Note:  You might notice some irritation and congestion in your nose or some drainage.  This is from the oxygen used during your procedure.  There is no need for concern and it should clear up in a day or so.  SYMPTOMS TO REPORT IMMEDIATELY:     Following upper endoscopy (EGD)  Vomiting of blood or coffee ground material  New chest pain or pain under the shoulder blades  Painful or persistently difficult swallowing  New shortness of breath  Fever of 100F or higher  Black, tarry-looking stools  For urgent or emergent issues, a gastroenterologist can be reached at any hour by calling [phone].   DIET:  We do recommend a small meal at first, but then you may proceed to your regular diet.  Drink plenty of fluids but you should avoid alcoholic beverages for 24 hours.  ACTIVITY:  You should plan to take it easy for the rest of  today and you should NOT DRIVE or use heavy machinery until tomorrow (because of the sedation medicines used during the test).    FOLLOW UP: Our staff will call the number listed on your records the next business day following your procedure to check on you and address any questions or concerns that you may have regarding the information given to you following your procedure. If we do not reach you, we will leave a message.  However, if you are feeling well and you are not experiencing any problems, there is no need to return our call.  We will assume that you have returned to your regular daily activities without incident.  If any biopsies were taken you will be contacted by phone or by letter within the next 1-3 weeks.  Please call us at [phone] if you have not heard about the biopsies in 3 weeks.    SIGNATURES/CONFIDENTIALITY: You and/or your care partner have signed paperwork which will be entered into your electronic medical record.  These signatures attest to the fact that that the information above on your After Visit Summary has been reviewed and is understood.  Full responsibility of the confidentiality of this discharge information lies with you and/or your care-partner.

## 2015-12-18 NOTE — Op Note (Signed)
[facility] Patient Name: Eric Case Procedure Date: 12/18/2015 7:46 AM MRN: [id number] Endoscopist: Wilhemina BonitoJohn N. Marina GoodellPerry , MD Age: [age] Referring MD:  Date of Birth: [birth year] Gender: Male Account #: [id number] Procedure:                Upper GI endoscopy Indications:              Unexplained chest pain Medicines:                Monitored Anesthesia Care Procedure:                Pre-Anesthesia Assessment:                           - Prior to the procedure, a History and Physical                            was performed, and patient medications and                            allergies were reviewed. The patient's tolerance of                            previous anesthesia was also reviewed. The risks                            and benefits of the procedure and the sedation                            options and risks were discussed with the patient.                            All questions were answered, and informed consent                            was obtained. Prior Anticoagulants: The patient has                            taken no previous anticoagulant or antiplatelet                            agents. ASA Grade Assessment: II - A patient with                            mild systemic disease. After reviewing the risks                            and benefits, the patient was deemed in                            satisfactory condition to undergo the procedure.                           After obtaining informed consent, the endoscope was  passed under direct vision. Throughout the                            procedure, the patient's blood pressure, pulse, and                            oxygen saturations were monitored continuously. The                            Model GIF-[id number]) scope was introduced                            through the mouth, and advanced to the second part                            of duodenum. The upper GI endoscopy  was                            accomplished without difficulty. The patient                            tolerated the procedure well. Scope In: Scope Out: Findings:                 The esophagus was normal.                           The stomach was normal, except for incidental                            benign fundic gland polyps.                           The examined duodenum was normal.                           The cardia and gastric fundus were normal on                            retroflexion, except for moderate size sliding                            hiatal hernia. Complications:            No immediate complications. Estimated Blood Loss:     Estimated blood loss: none. Impression:               - Essentially normal EGD save hiatal hernia and                            incidental gastric polyps                           - GERD                           - Chest pain. Recommendation:           -  Reflux precautions. Please avoid recumbent                            position shortly after meals.                           - Resume previous diet.                           - Continue present medications.                           - Return to the care of your primary provider Wilhemina Bonito. Marina Goodell, MD 12/18/2015 8:27:31 AM This report has been signed electronically.

## 2015-12-19 ENCOUNTER — Telehealth: Payer: Self-pay

## 2015-12-19 NOTE — Telephone Encounter (Signed)
  Follow up Call-  Call back number 12/18/2015 04/11/2015  Post procedure Call Back phone  # [phone]  Permission to leave phone message Yes Yes  Some recent data might be hidden     Patient questions:  Do you have a fever, pain , or abdominal swelling? No. Pain Score  0 *  Have you tolerated food without any problems? Yes.    Have you been able to return to your normal activities? Yes.    Do you have any questions about your discharge instructions: Diet   No. Medications  No. Follow up visit  No.  Do you have questions or concerns about your Care? No.  Actions: * If pain score is 4 or above: No action needed, pain <4.

## 2016-03-11 NOTE — Progress Notes (Signed)
Pt is being scheduled for preop appt; please place surgical orders in epic. Thanks.  

## 2016-03-17 ENCOUNTER — Ambulatory Visit: Payer: Self-pay | Admitting: Orthopedic Surgery

## 2016-03-24 NOTE — Patient Instructions (Signed)
Eric Case  03/24/2016   Your procedure is scheduled on: Friday 04/02/2016  [facility]  Entrance [place]  elevators to 3rd floor to  Short Stay Center at  0800  AM.  Call this number if you have problems the morning of surgery [phone]   Remember: ONLY 1 PERSON MAY GO WITH YOU TO SHORT STAY TO GET  READY MORNING OF YOUR SURGERY.   Do not eat food or drink liquids :After Midnight.     Take these medicines the morning of surgery with A SIP OF WATER: Levothyroxine                                You may not have any metal on your body including hair pins and              piercings  Do not wear jewelry, make-up, lotions, powders or perfumes, deodorant             Do not wear nail polish.  Do not shave  48 hours prior to surgery.              Men may shave face and neck.   Do not bring valuables to the hospital. [place] IS NOT             RESPONSIBLE   FOR VALUABLES.  Contacts, dentures or bridgework may not be worn into surgery.  Leave suitcase in the car. After surgery it may be brought to your room.                  Please read over the following fact sheets you were given: _____________________________________________________________________             [facility] - Preparing for Surgery Before surgery, you can play an important role.  Because skin is not sterile, your skin needs to be as free of germs as possible.  You can reduce the number of germs on your skin by washing with CHG (chlorahexidine gluconate) soap before surgery.  CHG is an antiseptic cleaner which kills germs and bonds with the skin to continue killing germs even after washing. Please DO NOT use if you have an allergy to CHG or antibacterial soaps.  If your skin becomes reddened/irritated stop using the CHG and inform your nurse when you arrive at Short Stay. Do not shave (including legs and underarms) for at least 48 hours prior to the first CHG shower.  You  may shave your face/neck. Please follow these instructions carefully:  1.  Shower with CHG Soap the night before surgery and the  morning of Surgery.  2.  If you choose to wash your hair, wash your hair first as usual with your  normal  shampoo.  3.  After you shampoo, rinse your hair and body thoroughly to remove the  shampoo.                           4.  Use CHG as you would any other liquid soap.  You can apply chg directly  to the skin and wash                       Gently with a scrungie or clean washcloth.  5.  Apply the CHG Soap to your body ONLY FROM THE NECK DOWN.   Do not use on face/ open                           Wound or open sores. Avoid contact with eyes, ears mouth and genitals (private parts).                       Wash face,  Genitals (private parts) with your normal soap.             6.  Wash thoroughly, paying special attention to the area where your surgery  will be performed.  7.  Thoroughly rinse your body with warm water from the neck down.  8.  DO NOT shower/wash with your normal soap after using and rinsing off  the CHG Soap.                9.  Pat yourself dry with a clean towel.            10.  Wear clean pajamas.            11.  Place clean sheets on your bed the night of your first shower and do not  sleep with pets. Day of Surgery : Do not apply any lotions/deodorants the morning of surgery.  Please wear clean clothes to the hospital/surgery center.  FAILURE TO FOLLOW THESE INSTRUCTIONS MAY RESULT IN THE CANCELLATION OF YOUR SURGERY PATIENT SIGNATURE_________________________________  NURSE SIGNATURE__________________________________  ________________________________________________________________________  WHAT IS A BLOOD TRANSFUSION? Blood Transfusion Information  A transfusion is the replacement of blood or some of its parts. Blood is made up of multiple cells which provide different functions.  Red blood cells carry oxygen and are used for blood loss  replacement.  White blood cells fight against infection.  Platelets control bleeding.  Plasma helps clot blood.  Other blood products are available for specialized needs, such as hemophilia or other clotting disorders. BEFORE THE TRANSFUSION  Who gives blood for transfusions?   Healthy volunteers who are fully evaluated to make sure their blood is safe. This is blood bank blood. Transfusion therapy is the safest it has ever been in the practice of medicine. Before blood is taken from a donor, a complete history is taken to make sure that person has no history of diseases nor engages in risky social behavior (examples are intravenous drug use or sexual activity with multiple partners). The donor's travel history is screened to minimize risk of transmitting infections, such as malaria. The donated blood is tested for signs of infectious diseases, such as HIV and hepatitis. The blood is then tested to be sure it is compatible with you in order to minimize the chance of a transfusion reaction. If you or a relative donates blood, this is often done in anticipation of surgery and is not appropriate for emergency situations. It takes many days to process the donated blood. RISKS AND COMPLICATIONS Although transfusion therapy is very safe and saves many lives, the main dangers of transfusion include:   Getting an infectious disease.  Developing a transfusion reaction. This is an allergic reaction to something in the blood you were given. Every precaution is taken to prevent this. The decision to have a blood transfusion has been considered carefully by your caregiver before blood is given. Blood is not given unless the benefits outweigh the risks. AFTER THE TRANSFUSION  Right after receiving a  blood transfusion, you will usually feel much better and more energetic. This is especially true if your red blood cells have gotten low (anemic). The transfusion raises the level of the red blood cells which  carry oxygen, and this usually causes an energy increase.  The nurse administering the transfusion will monitor you carefully for complications. HOME CARE INSTRUCTIONS  No special instructions are needed after a transfusion. You may find your energy is better. Speak with your caregiver about any limitations on activity for underlying diseases you may have. SEEK MEDICAL CARE IF:   Your condition is not improving after your transfusion.  You develop redness or irritation at the intravenous (IV) site. SEEK IMMEDIATE MEDICAL CARE IF:  Any of the following symptoms occur over the next 12 hours:  Shaking chills.  You have a temperature by mouth above 102 F (38.9 C), not controlled by medicine.  Chest, back, or muscle pain.  People around you feel you are not acting correctly or are confused.  Shortness of breath or difficulty breathing.  Dizziness and fainting.  You get a rash or develop hives.  You have a decrease in urine output.  Your urine turns a dark color or changes to pink, red, or brown. Any of the following symptoms occur over the next 10 days:  You have a temperature by mouth above 102 F (38.9 C), not controlled by medicine.  Shortness of breath.  Weakness after normal activity.  The white part of the eye turns yellow (jaundice).  You have a decrease in the amount of urine or are urinating less often.  Your urine turns a dark color or changes to pink, red, or brown. Document Released: 03/05/2000 Document Revised: 05/31/2011 Document Reviewed: 10/23/2007 [facility] Patient Information 2014 [place], [place].  _______________________________________________________________________

## 2016-03-25 NOTE — H&P (Signed)
TOTAL KNEE ADMISSION H&P  Patient is being admitted for right total knee arthroplasty.  Subjective:  Chief Complaint:right knee pain.  HPI: Eric Case, 71 y.o. male, has a history of pain and functional disability in the right knee due to arthritis and has failed non-surgical conservative treatments for greater than 12 weeks to includecorticosteriod injections, viscosupplementation injections, supervised PT with diminished ADL's post treatment, weight reduction as appropriate and activity modification.  Onset of symptoms was gradual, starting 2 years ago with gradually worsening course since that time. The patient noted prior procedures on the knee to include  arthroscopy on the right knee(s).  Patient currently rates pain in the right knee(s) at 6 out of 10 with activity. Patient has worsening of pain with activity and weight bearing, pain that interferes with activities of daily living and pain with passive range of motion.  Patient has evidence of subchondral sclerosis, periarticular osteophytes and joint space narrowing by imaging studies. There is no active infection.  Patient Active Problem List   Diagnosis Date Noted  . ESOPHAGEAL STRICTURE 12/31/2009  . GERD 12/31/2009  . DIVERTICULITIS OF COLON 12/31/2009  . PERSONAL HISTORY OF COLONIC POLYPS 12/31/2009   Past Medical History:  Diagnosis Date  . Anemia    on Iron- comes and goes per patient   . Arthritis   . Cataract    starting but very small  . Diverticulitis   . Diverticulosis   . GERD (gastroesophageal reflux disease)   . Hemorrhoids   . Hiatal hernia   . Hx of adenomatous colonic polyps   . Hypothyroidism   . Peptic stricture of esophagus     Past Surgical History:  Procedure Laterality Date  . COLONOSCOPY    . KNEE SURGERY Right    tendon tear   . MENISCUS REPAIR Right    2015 and May 2017  . POLYPECTOMY    . SHOULDER SURGERY     bilateral  . UPPER GASTROINTESTINAL ENDOSCOPY      No prescriptions prior  to admission.   Allergies  Allergen Reactions  . Ciprofloxacin Other (See Comments)    chest pain  . Hydromorphone Itching  . Oxycodone-Acetaminophen     REACTION: itching  . Pennsaid [Diclofenac Sodium] Other (See Comments)    "made me feel bad all over" Flu like symptoms/fever   . Sulfamethoxazole-Trimethoprim Rash    Social History  Substance Use Topics  . Smoking status: Never Smoker  . Smokeless tobacco: Never Used  . Alcohol use No    Family History  Problem Relation Age of Onset  . Breast cancer Mother   . Heart disease Mother   . Colon cancer Cousin   . Liver cancer Father   . Prostate cancer Father   . Prostate cancer Maternal Uncle   . Prostate cancer Maternal Uncle   . Prostate cancer Maternal Uncle   . Colon polyps Neg Hx   . Esophageal cancer Neg Hx   . Rectal cancer Neg Hx   . Stomach cancer Neg Hx      Review of Systems  Constitutional: Negative.   HENT: Negative.   Eyes: Negative.   Respiratory: Negative.   Cardiovascular: Negative.   Gastrointestinal: Negative.   Genitourinary: Negative.   Musculoskeletal: Positive for joint pain.  Skin: Negative.   Neurological: Negative.   Endo/Heme/Allergies: Negative.   Psychiatric/Behavioral: Negative.     Objective:  Physical Exam  Constitutional: He is oriented to person, place, and time. He appears well-developed.  HENT:  Head: Normocephalic.  Eyes: EOM are normal.  Neck: Normal range of motion.  Cardiovascular: Normal heart sounds.   GI: Soft.  Genitourinary:  Genitourinary Comments: Deferred  Musculoskeletal: Normal range of motion.  Neurological: He is alert and oriented to person, place, and time.  Skin: Skin is warm and dry.  Psychiatric: His behavior is normal.    Vital signs in last 24 hours: BP: ()/()  Arterial Line BP: ()/()   Labs:   Estimated body mass index is 29.5 kg/m as calculated from the following:   Height as of 12/18/15: 5\' 8"  (1.727 m).   Weight as of 12/18/15:  88 kg (194 lb).   Imaging Review Plain radiographs demonstrate moderate degenerative joint disease of the right knee(s). The overall alignment ismild varus. The bone quality appears to be good for age and reported activity level.  Assessment/Plan:  End stage arthritis, right knee   The patient history, physical examination, clinical judgment of the provider and imaging studies are consistent with end stage degenerative joint disease of the right knee(s) and total knee arthroplasty is deemed medically necessary. The treatment options including medical management, injection therapy arthroscopy and arthroplasty were discussed at length. The risks and benefits of total knee arthroplasty were presented and reviewed. The risks due to aseptic loosening, infection, stiffness, patella tracking problems, thromboembolic complications and other imponderables were discussed. The patient acknowledged the explanation, agreed to proceed with the plan and consent was signed. Patient is being admitted for inpatient treatment for surgery, pain control, PT, OT, prophylactic antibiotics, VTE prophylaxis, progressive ambulation and ADL's and discharge planning. The patient is planning to be discharged home with home health services.  Will use IV tranexamic acid. Contraindications and adverse affects of Tranexamic acid discussed in detail. Patient denies any of these at this time and understands the risks and benefits.

## 2016-03-26 ENCOUNTER — Encounter (HOSPITAL_COMMUNITY): Payer: Self-pay

## 2016-03-26 ENCOUNTER — Encounter (HOSPITAL_COMMUNITY)
Admission: RE | Admit: 2016-03-26 | Discharge: 2016-03-26 | Disposition: A | Discharge status: Home / Self Care | Payer: BLUE CROSS/BLUE SHIELD | Source: Ambulatory Visit | Attending: Specialist | Admitting: Specialist

## 2016-03-26 ENCOUNTER — Encounter (INDEPENDENT_AMBULATORY_CARE_PROVIDER_SITE_OTHER): Payer: Self-pay

## 2016-03-26 DIAGNOSIS — Z01818 Encounter for other preprocedural examination: Secondary | ICD-10-CM | POA: Insufficient documentation

## 2016-03-26 HISTORY — DX: Other complications of anesthesia, initial encounter: T88.59XA

## 2016-03-26 HISTORY — DX: Adverse effect of unspecified anesthetic, initial encounter: T41.45XA

## 2016-03-26 LAB — BASIC METABOLIC PANEL
Anion gap: 9 (ref 5–15)
BUN: 25 mg/dL — AB (ref 6–20)
CHLORIDE: 105 mmol/L (ref 101–111)
CO2: 23 mmol/L (ref 22–32)
CREATININE: 1.01 mg/dL (ref 0.61–1.24)
Calcium: 9 mg/dL (ref 8.9–10.3)
GFR calc Af Amer: >60 mL/min (ref >60)
GFR calc non Af Amer: >60 mL/min (ref >60)
GLUCOSE: 100 mg/dL — AB (ref 65–99)
POTASSIUM: 4 mmol/L (ref 3.5–5.1)
Sodium: 137 mmol/L (ref 135–145)

## 2016-03-26 LAB — URINALYSIS, ROUTINE W REFLEX MICROSCOPIC
Bilirubin Urine: NEGATIVE
Glucose, UA: NEGATIVE mg/dL
HGB URINE DIPSTICK: NEGATIVE
Ketones, ur: NEGATIVE mg/dL
LEUKOCYTES UA: NEGATIVE
NITRITE: NEGATIVE
PROTEIN: NEGATIVE mg/dL
Specific Gravity, Urine: 1.019 (ref 1.005–1.030)
pH: 5 (ref 5.0–8.0)

## 2016-03-26 LAB — CBC
HCT: 41.5 % (ref 39.0–52.0)
Hemoglobin: 13.3 g/dL (ref 13.0–17.0)
MCH: 25.6 pg — AB (ref 26.0–34.0)
MCHC: 32 g/dL (ref 30.0–36.0)
MCV: 79.8 fL (ref 78.0–100.0)
Platelets: 300 10*3/uL (ref 150–400)
RBC: 5.2 MIL/uL (ref 4.22–5.81)
RDW: 15.4 % (ref 11.5–15.5)
WBC: 7.7 10*3/uL (ref 4.0–10.5)

## 2016-03-26 LAB — SURGICAL PCR SCREEN
MRSA, PCR: NEGATIVE
Staphylococcus aureus: NEGATIVE

## 2016-03-26 LAB — PROTIME-INR
INR: 0.98
Prothrombin Time: 13 seconds (ref 11.4–15.2)

## 2016-03-26 LAB — ABO/RH: ABO/RH(D): A POS

## 2016-03-26 LAB — APTT: aPTT: 33 seconds (ref 24–36)

## 2016-04-02 ENCOUNTER — Encounter (HOSPITAL_COMMUNITY)
Admission: RE | Disposition: A | Discharge status: Home Health | Payer: Self-pay | Source: Ambulatory Visit | Attending: Specialist

## 2016-04-02 ENCOUNTER — Inpatient Hospital Stay (HOSPITAL_COMMUNITY)
Admission: RE | Admit: 2016-04-02 | Discharge: 2016-04-04 | DRG: 470 | Disposition: A | Discharge status: Home Health | Payer: BLUE CROSS/BLUE SHIELD | Source: Ambulatory Visit | Attending: Specialist | Admitting: Specialist

## 2016-04-02 ENCOUNTER — Encounter (HOSPITAL_COMMUNITY): Payer: Self-pay | Admitting: *Deleted

## 2016-04-02 ENCOUNTER — Inpatient Hospital Stay (HOSPITAL_COMMUNITY): Payer: BLUE CROSS/BLUE SHIELD | Admitting: Anesthesiology

## 2016-04-02 DIAGNOSIS — H269 Unspecified cataract: Secondary | ICD-10-CM | POA: Diagnosis present

## 2016-04-02 DIAGNOSIS — Z885 Allergy status to narcotic agent status: Secondary | ICD-10-CM | POA: Diagnosis not present

## 2016-04-02 DIAGNOSIS — Z888 Allergy status to other drugs, medicaments and biological substances status: Secondary | ICD-10-CM | POA: Diagnosis not present

## 2016-04-02 DIAGNOSIS — Z800 Family history of malignant neoplasm of digestive organs: Secondary | ICD-10-CM

## 2016-04-02 DIAGNOSIS — E039 Hypothyroidism, unspecified: Secondary | ICD-10-CM | POA: Diagnosis present

## 2016-04-02 DIAGNOSIS — Z8042 Family history of malignant neoplasm of prostate: Secondary | ICD-10-CM | POA: Diagnosis not present

## 2016-04-02 DIAGNOSIS — Z882 Allergy status to sulfonamides status: Secondary | ICD-10-CM | POA: Diagnosis not present

## 2016-04-02 DIAGNOSIS — K5732 Diverticulitis of large intestine without perforation or abscess without bleeding: Secondary | ICD-10-CM | POA: Diagnosis present

## 2016-04-02 DIAGNOSIS — K449 Diaphragmatic hernia without obstruction or gangrene: Secondary | ICD-10-CM | POA: Diagnosis present

## 2016-04-02 DIAGNOSIS — Z881 Allergy status to other antibiotic agents status: Secondary | ICD-10-CM

## 2016-04-02 DIAGNOSIS — Z803 Family history of malignant neoplasm of breast: Secondary | ICD-10-CM | POA: Diagnosis not present

## 2016-04-02 DIAGNOSIS — M1711 Unilateral primary osteoarthritis, right knee: Principal | ICD-10-CM | POA: Diagnosis present

## 2016-04-02 DIAGNOSIS — Z8249 Family history of ischemic heart disease and other diseases of the circulatory system: Secondary | ICD-10-CM | POA: Diagnosis not present

## 2016-04-02 DIAGNOSIS — Z96659 Presence of unspecified artificial knee joint: Secondary | ICD-10-CM

## 2016-04-02 DIAGNOSIS — Z86010 Personal history of colonic polyps: Secondary | ICD-10-CM

## 2016-04-02 DIAGNOSIS — M25561 Pain in right knee: Secondary | ICD-10-CM | POA: Diagnosis present

## 2016-04-02 DIAGNOSIS — K219 Gastro-esophageal reflux disease without esophagitis: Secondary | ICD-10-CM | POA: Diagnosis present

## 2016-04-02 HISTORY — PX: TOTAL KNEE ARTHROPLASTY: SHX125

## 2016-04-02 LAB — TYPE AND SCREEN
ABO/RH(D): A POS
Antibody Screen: NEGATIVE

## 2016-04-02 SURGERY — ARTHROPLASTY, KNEE, TOTAL
Anesthesia: Monitor Anesthesia Care | Site: Knee | Laterality: Right

## 2016-04-02 MED ORDER — ACETAMINOPHEN 650 MG RE SUPP: 650.0000 mg | Freq: Four times a day (QID) | RECTAL | Status: DC | PRN

## 2016-04-02 MED ORDER — ONDANSETRON HCL 4 MG/2ML IJ SOLN
INTRAMUSCULAR | Status: AC
  Filled 2016-04-02: qty 2

## 2016-04-02 MED ORDER — ALUM & MAG HYDROXIDE-SIMETH 200-200-20 MG/5ML PO SUSP: 30.0000 mL | ORAL | Status: DC | PRN

## 2016-04-02 MED ORDER — TRANEXAMIC ACID 1000 MG/10ML IV SOLN
1000.0000 mg | INTRAVENOUS | Status: AC
  Administered 2016-04-02: 1000 mg via INTRAVENOUS
  Filled 2016-04-02: qty 1100

## 2016-04-02 MED ORDER — PROPOFOL 10 MG/ML IV BOLUS
INTRAVENOUS | Status: AC
  Filled 2016-04-02: qty 20

## 2016-04-02 MED ORDER — FERROUS SULFATE 325 (65 FE) MG PO TABS: 325.0000 mg | ORAL_TABLET | Freq: Every day | ORAL | Status: DC | PRN

## 2016-04-02 MED ORDER — METHOCARBAMOL 1000 MG/10ML IJ SOLN
500.0000 mg | Freq: Four times a day (QID) | INTRAVENOUS | Status: DC | PRN
  Filled 2016-04-02: qty 5

## 2016-04-02 MED ORDER — PHENOL 1.4 % MT LIQD: 1.0000 | OROMUCOSAL | Status: DC | PRN

## 2016-04-02 MED ORDER — BUPIVACAINE HCL (PF) 0.25 % IJ SOLN
INTRAMUSCULAR | Status: DC | PRN
  Administered 2016-04-02: 30 mL

## 2016-04-02 MED ORDER — PHENYLEPHRINE HCL 10 MG/ML IJ SOLN
INTRAMUSCULAR | Status: AC
  Filled 2016-04-02: qty 1

## 2016-04-02 MED ORDER — FERROUS SULFATE 325 (65 FE) MG PO TABS
325.0000 mg | ORAL_TABLET | Freq: Three times a day (TID) | ORAL | Status: DC
  Administered 2016-04-02 – 2016-04-04 (×5): 325 mg via ORAL
  Filled 2016-04-02 (×5): qty 1

## 2016-04-02 MED ORDER — BUPIVACAINE HCL (PF) 0.25 % IJ SOLN
INTRAMUSCULAR | Status: AC
  Filled 2016-04-02: qty 30

## 2016-04-02 MED ORDER — ENOXAPARIN SODIUM 30 MG/0.3ML ~~LOC~~ SOLN
30.0000 mg | Freq: Two times a day (BID) | SUBCUTANEOUS | Status: DC
  Administered 2016-04-03 – 2016-04-04 (×3): 30 mg via SUBCUTANEOUS
  Filled 2016-04-02 (×3): qty 0.3

## 2016-04-02 MED ORDER — PANTOPRAZOLE SODIUM 40 MG PO TBEC
40.0000 mg | DELAYED_RELEASE_TABLET | Freq: Every day | ORAL | Status: DC
Start: 2016-04-02 — End: 2016-04-04
  Administered 2016-04-02 – 2016-04-03 (×2): 40 mg via ORAL
  Filled 2016-04-02 (×2): qty 1

## 2016-04-02 MED ORDER — BUPIVACAINE HCL (PF) 0.5 % IJ SOLN
INTRAMUSCULAR | Status: AC
  Filled 2016-04-02: qty 30

## 2016-04-02 MED ORDER — MIDAZOLAM HCL 2 MG/2ML IJ SOLN
INTRAMUSCULAR | Status: AC
  Filled 2016-04-02: qty 2

## 2016-04-02 MED ORDER — ACETAMINOPHEN 325 MG PO TABS
650.0000 mg | ORAL_TABLET | Freq: Four times a day (QID) | ORAL | Status: DC | PRN
  Filled 2016-04-02 (×2): qty 2

## 2016-04-02 MED ORDER — ONDANSETRON HCL 4 MG/2ML IJ SOLN
INTRAMUSCULAR | Status: DC | PRN
  Administered 2016-04-02: 4 mg via INTRAVENOUS

## 2016-04-02 MED ORDER — ONDANSETRON HCL 4 MG/2ML IJ SOLN: 4.0000 mg | Freq: Four times a day (QID) | INTRAMUSCULAR | Status: DC | PRN

## 2016-04-02 MED ORDER — LEVOTHYROXINE SODIUM 112 MCG PO TABS
112.0000 ug | ORAL_TABLET | Freq: Every day | ORAL | Status: DC
  Administered 2016-04-03 – 2016-04-04 (×2): 112 ug via ORAL
  Filled 2016-04-02 (×2): qty 1

## 2016-04-02 MED ORDER — POVIDONE-IODINE 7.5 % EX SOLN: Freq: Once | CUTANEOUS | Status: DC

## 2016-04-02 MED ORDER — MENTHOL 3 MG MT LOZG: 1.0000 | LOZENGE | OROMUCOSAL | Status: DC | PRN

## 2016-04-02 MED ORDER — HYDROCODONE-ACETAMINOPHEN 7.5-325 MG PO TABS
1.0000 | ORAL_TABLET | ORAL | Status: DC | PRN
  Administered 2016-04-02 – 2016-04-03 (×7): 2 via ORAL
  Administered 2016-04-04: 07:00:00 1 via ORAL
  Filled 2016-04-02: qty 2
  Filled 2016-04-02: qty 1
  Filled 2016-04-02 (×6): qty 2

## 2016-04-02 MED ORDER — PROPOFOL 10 MG/ML IV BOLUS
INTRAVENOUS | Status: AC
  Filled 2016-04-02: qty 60

## 2016-04-02 MED ORDER — PHENYLEPHRINE 40 MCG/ML (10ML) SYRINGE FOR IV PUSH (FOR BLOOD PRESSURE SUPPORT)
PREFILLED_SYRINGE | INTRAVENOUS | Status: AC
  Filled 2016-04-02: qty 10

## 2016-04-02 MED ORDER — MEPERIDINE HCL 50 MG/ML IJ SOLN: 6.2500 mg | INTRAMUSCULAR | Status: DC | PRN

## 2016-04-02 MED ORDER — ONDANSETRON HCL 4 MG/2ML IJ SOLN: 4.0000 mg | Freq: Once | INTRAMUSCULAR | Status: DC | PRN

## 2016-04-02 MED ORDER — POLYETHYLENE GLYCOL 3350 17 G PO PACK: 17.0000 g | PACK | Freq: Every day | ORAL | Status: DC | PRN

## 2016-04-02 MED ORDER — DEXAMETHASONE SODIUM PHOSPHATE 10 MG/ML IJ SOLN
INTRAMUSCULAR | Status: AC
  Filled 2016-04-02: qty 1

## 2016-04-02 MED ORDER — FENTANYL CITRATE (PF) 100 MCG/2ML IJ SOLN
50.0000 ug | INTRAMUSCULAR | Status: DC | PRN
  Administered 2016-04-02 (×2): 50 ug via INTRAVENOUS

## 2016-04-02 MED ORDER — KETOROLAC TROMETHAMINE 30 MG/ML IJ SOLN
INTRAMUSCULAR | Status: DC | PRN
  Administered 2016-04-02: 30 mg

## 2016-04-02 MED ORDER — METOCLOPRAMIDE HCL 5 MG/ML IJ SOLN: 5.0000 mg | Freq: Three times a day (TID) | INTRAMUSCULAR | Status: DC | PRN

## 2016-04-02 MED ORDER — BISACODYL 5 MG PO TBEC: 5.0000 mg | DELAYED_RELEASE_TABLET | Freq: Every day | ORAL | Status: DC | PRN

## 2016-04-02 MED ORDER — CEFAZOLIN SODIUM-DEXTROSE 2-4 GM/100ML-% IV SOLN
2.0000 g | INTRAVENOUS | Status: AC
  Administered 2016-04-02: 2 g via INTRAVENOUS

## 2016-04-02 MED ORDER — MORPHINE SULFATE (PF) 2 MG/ML IV SOLN: 2.0000 mg | INTRAVENOUS | Status: DC | PRN

## 2016-04-02 MED ORDER — DOCUSATE SODIUM 100 MG PO CAPS
100.0000 mg | ORAL_CAPSULE | Freq: Two times a day (BID) | ORAL | Status: DC
  Administered 2016-04-02 – 2016-04-04 (×4): 100 mg via ORAL
  Filled 2016-04-02 (×4): qty 1

## 2016-04-02 MED ORDER — LACTATED RINGERS IV SOLN
INTRAVENOUS | Status: DC
  Administered 2016-04-02 (×4): via INTRAVENOUS

## 2016-04-02 MED ORDER — CEFAZOLIN SODIUM-DEXTROSE 2-4 GM/100ML-% IV SOLN
INTRAVENOUS | Status: AC
  Filled 2016-04-02: qty 100

## 2016-04-02 MED ORDER — DIPHENHYDRAMINE HCL 12.5 MG/5ML PO ELIX
12.5000 mg | ORAL_SOLUTION | ORAL | Status: DC | PRN
  Administered 2016-04-03 – 2016-04-04 (×2): 25 mg via ORAL
  Filled 2016-04-02 (×2): qty 10

## 2016-04-02 MED ORDER — PROPOFOL 500 MG/50ML IV EMUL
INTRAVENOUS | Status: DC | PRN
  Administered 2016-04-02: 85 ug/kg/min via INTRAVENOUS

## 2016-04-02 MED ORDER — METOCLOPRAMIDE HCL 5 MG PO TABS: 5.0000 mg | ORAL_TABLET | Freq: Three times a day (TID) | ORAL | Status: DC | PRN

## 2016-04-02 MED ORDER — SODIUM CHLORIDE 0.9 % IJ SOLN
INTRAMUSCULAR | Status: DC | PRN
  Administered 2016-04-02: 29 mL

## 2016-04-02 MED ORDER — BUPIVACAINE IN DEXTROSE 0.75-8.25 % IT SOLN
INTRATHECAL | Status: DC | PRN
  Administered 2016-04-02: 2 mL via INTRATHECAL

## 2016-04-02 MED ORDER — MIDAZOLAM HCL 5 MG/ML IJ SOLN: 2.0000 mg | Freq: Once | INTRAMUSCULAR | Status: DC

## 2016-04-02 MED ORDER — DEXAMETHASONE SODIUM PHOSPHATE 10 MG/ML IJ SOLN
10.0000 mg | Freq: Once | INTRAMUSCULAR | Status: AC
  Administered 2016-04-02: 10 mg via INTRAVENOUS

## 2016-04-02 MED ORDER — CEFAZOLIN SODIUM-DEXTROSE 2-4 GM/100ML-% IV SOLN
2.0000 g | Freq: Four times a day (QID) | INTRAVENOUS | Status: AC
  Administered 2016-04-02 (×2): 2 g via INTRAVENOUS
  Filled 2016-04-02 (×2): qty 100

## 2016-04-02 MED ORDER — EPINEPHRINE PF 1 MG/ML IJ SOLN
INTRAMUSCULAR | Status: AC
  Filled 2016-04-02: qty 1

## 2016-04-02 MED ORDER — PROPOFOL 10 MG/ML IV BOLUS
INTRAVENOUS | Status: DC | PRN
  Administered 2016-04-02: 30 mg via INTRAVENOUS

## 2016-04-02 MED ORDER — ONDANSETRON HCL 4 MG PO TABS: 4.0000 mg | ORAL_TABLET | Freq: Four times a day (QID) | ORAL | Status: DC | PRN

## 2016-04-02 MED ORDER — STERILE WATER FOR IRRIGATION IR SOLN
Status: DC | PRN
  Administered 2016-04-02: 2000 mL

## 2016-04-02 MED ORDER — 0.9 % SODIUM CHLORIDE (POUR BTL) OPTIME
TOPICAL | Status: DC | PRN
  Administered 2016-04-02: 1000 mL

## 2016-04-02 MED ORDER — FENTANYL CITRATE (PF) 100 MCG/2ML IJ SOLN
INTRAMUSCULAR | Status: AC
  Filled 2016-04-02: qty 2

## 2016-04-02 MED ORDER — KETOROLAC TROMETHAMINE 30 MG/ML IJ SOLN
INTRAMUSCULAR | Status: AC
Start: 2016-04-02 — End: 2016-04-02
  Filled 2016-04-02: qty 1

## 2016-04-02 MED ORDER — DEXAMETHASONE SODIUM PHOSPHATE 10 MG/ML IJ SOLN
10.0000 mg | Freq: Once | INTRAMUSCULAR | Status: AC
  Administered 2016-04-03: 09:00:00 10 mg via INTRAVENOUS
  Filled 2016-04-02: qty 1

## 2016-04-02 MED ORDER — MAGNESIUM CITRATE PO SOLN
1.0000 | Freq: Once | ORAL | Status: DC | PRN
Start: 2016-04-02 — End: 2016-04-04

## 2016-04-02 MED ORDER — FENTANYL CITRATE (PF) 100 MCG/2ML IJ SOLN: 25.0000 ug | INTRAMUSCULAR | Status: DC | PRN

## 2016-04-02 MED ORDER — FENTANYL CITRATE (PF) 100 MCG/2ML IJ SOLN
100.0000 ug | Freq: Once | INTRAMUSCULAR | Status: AC
  Administered 2016-04-02: 100 ug via INTRAVENOUS

## 2016-04-02 MED ORDER — COENZYME Q10 30 MG PO CAPS
30.0000 mg | ORAL_CAPSULE | Freq: Every day | ORAL | Status: DC
Start: 2016-04-02 — End: 2016-04-02

## 2016-04-02 MED ORDER — MIDAZOLAM HCL 2 MG/2ML IJ SOLN
1.0000 mg | INTRAMUSCULAR | Status: DC | PRN
  Administered 2016-04-02 (×2): 2 mg via INTRAVENOUS
  Filled 2016-04-02 (×2): qty 2

## 2016-04-02 MED ORDER — PHENYLEPHRINE 40 MCG/ML (10ML) SYRINGE FOR IV PUSH (FOR BLOOD PRESSURE SUPPORT)
PREFILLED_SYRINGE | INTRAVENOUS | Status: DC | PRN
  Administered 2016-04-02: 80 ug via INTRAVENOUS
  Administered 2016-04-02 (×10): 40 ug via INTRAVENOUS

## 2016-04-02 MED ORDER — SODIUM CHLORIDE 0.9 % IJ SOLN
INTRAMUSCULAR | Status: AC
  Filled 2016-04-02: qty 50

## 2016-04-02 MED ORDER — SODIUM CHLORIDE 0.9 % IV SOLN
INTRAVENOUS | Status: DC
  Administered 2016-04-02: 16:00:00 via INTRAVENOUS

## 2016-04-02 MED ORDER — METHOCARBAMOL 500 MG PO TABS
500.0000 mg | ORAL_TABLET | Freq: Four times a day (QID) | ORAL | Status: DC | PRN
  Administered 2016-04-02 – 2016-04-03 (×5): 500 mg via ORAL
  Filled 2016-04-02 (×5): qty 1

## 2016-04-02 MED ORDER — ROPIVACAINE HCL 7.5 MG/ML IJ SOLN
INTRAMUSCULAR | Status: AC
  Filled 2016-04-02: qty 20

## 2016-04-02 MED ORDER — SODIUM CHLORIDE 0.9 % IR SOLN
Status: DC | PRN
  Administered 2016-04-02: 1000 mL

## 2016-04-02 SURGICAL SUPPLY — 60 items
BAG ZIPLOCK 12X15 (MISCELLANEOUS) ×6 IMPLANT
BANDAGE ACE 4X5 VEL STRL LF (GAUZE/BANDAGES/DRESSINGS) ×3 IMPLANT
BANDAGE ACE 6X5 VEL STRL LF (GAUZE/BANDAGES/DRESSINGS) ×3 IMPLANT
BLADE SAG 18X100X1.27 (BLADE) ×3 IMPLANT
BLADE SAW SGTL 13.0X1.19X90.0M (BLADE) ×3 IMPLANT
CAPT KNEE TOTAL 3 ATTUNE ×3 IMPLANT
CEMENT HV SMART SET (Cement) ×6 IMPLANT
CLOTH BEACON ORANGE TIMEOUT ST (SAFETY) ×3 IMPLANT
CUFF TOURN SGL QUICK 34 (TOURNIQUET CUFF) ×2
CUFF TRNQT CYL 34X4X40X1 (TOURNIQUET CUFF) ×1 IMPLANT
DERMABOND ADVANCED (GAUZE/BANDAGES/DRESSINGS) ×2
DERMABOND ADVANCED .7 DNX12 (GAUZE/BANDAGES/DRESSINGS) ×1 IMPLANT
DRAPE U-SHAPE 47X51 STRL (DRAPES) ×3 IMPLANT
DRSG AQUACEL AG ADV 3.5X10 (GAUZE/BANDAGES/DRESSINGS) ×3 IMPLANT
DRSG TEGADERM 4X4.75 (GAUZE/BANDAGES/DRESSINGS) ×3 IMPLANT
DURAPREP 26ML APPLICATOR (WOUND CARE) ×6 IMPLANT
ELECT REM PT RETURN 9FT ADLT (ELECTROSURGICAL) ×3
ELECTRODE REM PT RTRN 9FT ADLT (ELECTROSURGICAL) ×1 IMPLANT
EVACUATOR 1/8 PVC DRAIN (DRAIN) ×3 IMPLANT
GAUZE SPONGE 2X2 8PLY STRL LF (GAUZE/BANDAGES/DRESSINGS) ×1 IMPLANT
GLOVE BIO SURGEON STRL SZ8 (GLOVE) ×3 IMPLANT
GLOVE BIOGEL PI IND STRL 7.0 (GLOVE) ×2 IMPLANT
GLOVE BIOGEL PI IND STRL 7.5 (GLOVE) ×1 IMPLANT
GLOVE BIOGEL PI IND STRL 8 (GLOVE) ×2 IMPLANT
GLOVE BIOGEL PI INDICATOR 7.0 (GLOVE) ×4
GLOVE BIOGEL PI INDICATOR 7.5 (GLOVE) ×2
GLOVE BIOGEL PI INDICATOR 8 (GLOVE) ×4
GLOVE ECLIPSE 8.0 STRL XLNG CF (GLOVE) ×6 IMPLANT
GLOVE SURG ORTHO 9.0 STRL STRW (GLOVE) ×3 IMPLANT
GLOVE SURG SS PI 7.0 STRL IVOR (GLOVE) ×3 IMPLANT
GLOVE SURG SS PI 7.5 STRL IVOR (GLOVE) ×6 IMPLANT
GOWN SPEC L3 XXLG W/TWL (GOWN DISPOSABLE) ×3 IMPLANT
GOWN STRL REUS W/TWL XL LVL3 (GOWN DISPOSABLE) ×9 IMPLANT
HANDPIECE INTERPULSE COAX TIP (DISPOSABLE) ×2
IMMOBILIZER KNEE 20 (SOFTGOODS) ×3
IMMOBILIZER KNEE 20 THIGH 36 (SOFTGOODS) ×1 IMPLANT
PACK TOTAL KNEE CUSTOM (KITS) ×3 IMPLANT
POSITIONER SURGICAL ARM (MISCELLANEOUS) ×3 IMPLANT
SET HNDPC FAN SPRY TIP SCT (DISPOSABLE) ×1 IMPLANT
SET PAD KNEE POSITIONER (MISCELLANEOUS) ×3 IMPLANT
SPONGE GAUZE 2X2 STER 10/PKG (GAUZE/BANDAGES/DRESSINGS) ×2
SPONGE LAP 18X18 X RAY DECT (DISPOSABLE) IMPLANT
SPONGE SURGIFOAM ABS GEL 100 (HEMOSTASIS) ×3 IMPLANT
STOCKINETTE 6  STRL (DRAPES) ×2
STOCKINETTE 6 STRL (DRAPES) ×1 IMPLANT
SUCTION FRAZIER HANDLE 12FR (TUBING) ×2
SUCTION TUBE FRAZIER 12FR DISP (TUBING) ×1 IMPLANT
SUT BONE WAX W31G (SUTURE) IMPLANT
SUT MNCRL AB 3-0 PS2 18 (SUTURE) ×3 IMPLANT
SUT VIC AB 1 CT1 27 (SUTURE) ×8
SUT VIC AB 1 CT1 27XBRD ANTBC (SUTURE) ×4 IMPLANT
SUT VIC AB 2-0 CT1 27 (SUTURE) ×4
SUT VIC AB 2-0 CT1 TAPERPNT 27 (SUTURE) ×2 IMPLANT
SUT VLOC 180 0 24IN GS25 (SUTURE) ×3 IMPLANT
SYR 50ML LL SCALE MARK (SYRINGE) ×3 IMPLANT
TAPE STRIPS DRAPE STRL (GAUZE/BANDAGES/DRESSINGS) ×3 IMPLANT
TOWER CARTRIDGE SMART MIX (DISPOSABLE) ×3 IMPLANT
TRAY FOLEY W/METER SILVER 16FR (SET/KITS/TRAYS/PACK) ×3 IMPLANT
WRAP KNEE MAXI GEL POST OP (GAUZE/BANDAGES/DRESSINGS) ×3 IMPLANT
YANKAUER SUCT BULB TIP 10FT TU (MISCELLANEOUS) ×3 IMPLANT

## 2016-04-02 NOTE — Progress Notes (Signed)
Arsenio LoaderBryson Stilwell, PA in to see patient- made aware of patient's right foot being edematous pinkish red in color- toes and foot blanche satisfactorily - warm to the touch- right dorsalis pedis pulse present with doppler and palpable by touch- also made aware of right thigh being imprinted with redness around entire thigh- was informed by PA that it was done by tourniquet- lower ace wrap on right leg removed by PA and knee ace wrap checked and left on

## 2016-04-02 NOTE — Anesthesia Procedure Notes (Addendum)
Anesthesia Regional Block:  Adductor canal block  Pre-Anesthetic Checklist: ,, timeout performed, Correct Patient, Correct Site, Correct Laterality, Correct Procedure, Correct Position, site marked, Risks and benefits discussed,  Surgical consent,  Pre-op evaluation,  At surgeon's request and post-op pain management  Laterality: Right  Prep: chloraprep       Needles:  Injection technique: Single-shot  Needle Type: Echogenic Stimulator Needle     Needle Length: 9cm 9 cm Needle Gauge: 21 and 21 G    Additional Needles:  Procedures: ultrasound guided (picture in chart) Adductor canal block Narrative:  Start time: 04/02/2016 9:15 AM End time: 04/02/2016 9:30 AM Injection made incrementally with aspirations every 5 mL.  Performed by: Personally  Anesthesiologist: Arta BruceSSEY, Lenetta Piche  Additional Notes: Monitors applied. Patient sedated. Sterile prep and drape,hand hygiene and sterile gloves were used. Relevant anatomy identified.Needle position confirmed.Local anesthetic injected incrementally after negative aspiration. Local anesthetic spread visualized around nerve(s). Vascular puncture avoided. No complications. Image printed for medical record.The patient tolerated the procedure well.    Arta BruceKevin Jerie Basford MD

## 2016-04-02 NOTE — Progress Notes (Signed)
AssistedDr. Ossey with right, ultrasound guided, adductor canal block. Side rails up, monitors on throughout procedure. See vital signs in flow sheet. Tolerated Procedure well.  

## 2016-04-02 NOTE — Interval H&P Note (Signed)
History and Physical Interval Note:  04/02/2016 9:52 AM  Eric Case  has presented today for surgery, with the diagnosis of right knee osteoarthritis  The various methods of treatment have been discussed with the patient and family. After consideration of risks, benefits and other options for treatment, the patient has consented to  Procedure(s): RIGHT TOTAL KNEE ARTHROPLASTY (Right) as a surgical intervention .  The patient's history has been reviewed, patient examined, no change in status, stable for surgery.  I have reviewed the patient's chart and labs.  Questions were answered to the patient's satisfaction.     [name] ANDREW

## 2016-04-02 NOTE — Progress Notes (Signed)
Right foot and toes now pink in color- right dorsalis pedis pulse palpable- +3- foot and toes blanche satisfactorily-warm to the touch- slightly edematous- right thigh redness fading in appearance

## 2016-04-02 NOTE — Transfer of Care (Signed)
Immediate Anesthesia Transfer of Care Note  Patient: Eric Case  Procedure(s) Performed: Procedure(s) with comments: RIGHT TOTAL KNEE ARTHROPLASTY (Right) - Adductor Block  Patient Location: PACU  Anesthesia Type:Spinal  Level of Consciousness:  sedated, patient cooperative and responds to stimulation  Airway & Oxygen Therapy:Patient Spontanous Breathing and Patient connected to face mask oxgen  Post-op Assessment:  Report given to PACU RN and Post -op Vital signs reviewed and stable  Post vital signs:  Reviewed and stable  Last Vitals:  Vitals:   04/02/16 1000 04/02/16 1010  BP: 132/68 133/84  Pulse: 80 78  Resp: 16 15  Temp:      Complications: No apparent anesthesia complications

## 2016-04-02 NOTE — Anesthesia Preprocedure Evaluation (Signed)
Anesthesia Evaluation  Patient identified by MRN, date of birth, ID band Patient awake    Reviewed: Allergy & Precautions, NPO status , Patient's Chart, lab work & pertinent test results  Airway Mallampati: I  TM Distance: >3 FB Neck ROM: Full    Dental   Pulmonary    Pulmonary exam normal        Cardiovascular Normal cardiovascular exam     Neuro/Psych    GI/Hepatic GERD  Medicated and Controlled,  Endo/Other    Renal/GU      Musculoskeletal   Abdominal   Peds  Hematology   Anesthesia Other Findings   Reproductive/Obstetrics                             Anesthesia Physical Anesthesia Plan  ASA: II  Anesthesia Plan: MAC and Spinal   Post-op Pain Management:  Regional for Post-op pain   Induction: Intravenous  Airway Management Planned: Simple Face Mask  Additional Equipment:   Intra-op Plan:   Post-operative Plan:   Informed Consent: I have reviewed the patients History and Physical, chart, labs and discussed the procedure including the risks, benefits and alternatives for the proposed anesthesia with the patient or authorized representative who has indicated his/her understanding and acceptance.     Plan Discussed with: CRNA and Surgeon  Anesthesia Plan Comments:         Anesthesia Quick Evaluation

## 2016-04-02 NOTE — Anesthesia Procedure Notes (Signed)
Spinal  Patient location during procedure: OR Start time: 04/02/2016 10:30 AM End time: 04/02/2016 10:36 AM Reason for block: at surgeon's request Staffing Anesthesiologist: Arta BruceSSEY, KEVIN Resident/CRNA: Aquita Simmering Performed: anesthesiologist  Preanesthetic Checklist Completed: patient identified, site marked, surgical consent, pre-op evaluation, timeout performed, IV checked, risks and benefits discussed and monitors and equipment checked Spinal Block Patient position: sitting Prep: DuraPrep Patient monitoring: heart rate, cardiac monitor, continuous pulse ox and blood pressure Approach: right paramedian Location: L4-5 Injection technique: single-shot Needle Needle type: Pencan  Needle gauge: 24 G Needle length: 9 cm Needle insertion depth: 7 cm Catheter at skin depth: 7 cm Assessment Sensory level: T6 Additional Notes -heme, -para, VSS.  Pt tol well.  Lot and exp date OK.

## 2016-04-02 NOTE — Progress Notes (Signed)
PHARMACIST - PHYSICIAN ORDER COMMUNICATION  CONCERNING: P&T Medication Policy on Herbal Medications  DESCRIPTION:  This patient's order for:  CoEnzymeQ10  has been noted.  This product(s) is classified as an "herbal" or natural product. Due to a lack of definitive safety studies or FDA approval, nonstandard manufacturing practices, plus the potential risk of unknown drug-drug interactions while on inpatient medications, the Pharmacy and Therapeutics Committee does not permit the use of "herbal" or natural products of this type within [facility].   ACTION TAKEN: The pharmacy department is unable to verify this order at this time and your patient has been informed of this safety policy. Please reevaluate patient's clinical condition at discharge and address if the herbal or natural product(s) should be resumed at that time.  Junita PushMichelle Regana Kemple, PharmD, BCPS Pager: [phone] 04/02/2016@1 :47 PM

## 2016-04-02 NOTE — Anesthesia Postprocedure Evaluation (Signed)
Anesthesia Post Note  Patient: Eric Case  Procedure(s) Performed: Procedure(s) (LRB): RIGHT TOTAL KNEE ARTHROPLASTY (Right)  Patient location during evaluation: PACU Anesthesia Type: MAC Level of consciousness: oriented and awake and alert Pain management: pain level controlled Vital Signs Assessment: post-procedure vital signs reviewed and stable Respiratory status: spontaneous breathing, respiratory function stable and patient connected to nasal cannula oxygen Cardiovascular status: blood pressure returned to baseline and stable Postop Assessment: no headache and no backache Anesthetic complications: no       Last Vitals:  Vitals:   04/02/16 1315 04/02/16 1330  BP: 138/80 139/87  Pulse: 70 70  Resp: (!) 9 13  Temp:      Last Pain:  Vitals:   04/02/16 1300  TempSrc:   PainSc: 0-No pain                 Naysha Sholl DAVID

## 2016-04-02 NOTE — Progress Notes (Signed)
Right foot and toes still pinkish-red in color-but less so-Right foot warm to the touch- blanches satisfactorily- right dorsalis pedis pulse strong and palpable

## 2016-04-02 NOTE — Op Note (Signed)
DATE OF SURGERY:  04/02/2016  TIME: 12:34 PM  PATIENT NAME:  Eric Case    AGE: [age] y.o.   PRE-OPERATIVE DIAGNOSIS:  right knee osteoarthritis  POST-OPERATIVE DIAGNOSIS:  right knee osteoarthritis  PROCEDURE:  Procedure(s): RIGHT TOTAL KNEE ARTHROPLASTY  SURGEON:  Takisha Pelle ANDREW  ASSISTANT:  Bryson Stilwell, PA-C, present and scrubbed throughout the case, critical for assistance with exposure, retraction, instrumentation, and closure.  OPERATIVE IMPLANTS: Depuy PFC Sigma Rotating Platform.  Femur size 5, Tibia size 4, Patella size 35-peg oval button, with a 10 mm polyethylene insert.   PREOPERATIVE INDICATIONS:   Eric Case is a 71 y.o. year old male with end stage bone on bone arthritis of the knee who failed conservative treatment and elected for Total Knee Arthroplasty.   The risks, benefits, and alternatives were discussed at length including but not limited to the risks of infection, bleeding, nerve injury, stiffness, blood clots, the need for revision surgery, cardiopulmonary complications, among others, and they were willing to proceed.  OPERATIVE DESCRIPTION:  The patient was brought to the operative room and placed in a supine position.  Spinal anesthesia was administered.  IV antibiotics were given.  The lower extremity was prepped and draped in the usual sterile fashion.  Time out was performed.  The leg was elevated and exsanguinated and the tourniquet was inflated.  Anterior quadriceps tendon splitting approach was performed.  The patella was retracted and osteophytes were removed.  The anterior horn of the medial and lateral meniscus was removed and cruciate ligaments resected.   The distal femur was opened with the drill and the intramedullary distal femoral cutting jig was utilized, set at 5 degrees resecting 10 mm off the distal femur.  Care was taken to protect the collateral ligaments.  The distal femoral sizing jig was applied, taking care to  avoid notching.  Then the 4-in-1 cutting jig was applied and the anterior and posterior femur was cut, along with the chamfer cuts.    Then the extramedullary tibial cutting jig was utilized making the appropriate cut using the anterior tibial crest as a reference building in appropriate posterior slope.  Care was taken during the cut to protect the medial and collateral ligaments.  The proximal tibia was removed along with the posterior horns of the menisci.   The posterior medial femoral osteophytes and posterior lateral femoral osteophytes were removed.    The flexion gap was then measured and was symmetric with the extension gap, measured at 10.  I completed the distal femoral preparation using the appropriate jig to prepare the box.  The patella was then measured, and cut with the saw.    The proximal tibia sized and prepared accordingly with the reamer and the punch, and then all components were trialed with the trial insert.  The knee was found to have excellent balance and full motion.    The above named components were then cemented into place and all excess cement was removed.  The trial polyethylene component was in place during cementation, and then was exchanged for the real polyethylene component.    The knee was easily taken through a range of motion and the patella tracked well and the knee irrigated copiously and the parapatellar and subcutaneous tissue closed with vicryl, and monocryl with steri strips for the skin.  The arthrotomy was closed at 90 of flexion. The wounds were dressed with sterile gauze and the tourniquet released and the patient was awakened and returned to the PACU in  stable and satisfactory condition.  There were no complications.  Total tourniquet time was 90 minutes.

## 2016-04-03 LAB — CBC
HCT: 37.4 % — ABNORMAL LOW (ref 39.0–52.0)
HEMOGLOBIN: 12.1 g/dL — AB (ref 13.0–17.0)
MCH: 25.5 pg — AB (ref 26.0–34.0)
MCHC: 32.4 g/dL (ref 30.0–36.0)
MCV: 78.7 fL (ref 78.0–100.0)
Platelets: 277 10*3/uL (ref 150–400)
RBC: 4.75 MIL/uL (ref 4.22–5.81)
RDW: 15 % (ref 11.5–15.5)
WBC: 13.8 10*3/uL — ABNORMAL HIGH (ref 4.0–10.5)

## 2016-04-03 LAB — BASIC METABOLIC PANEL
ANION GAP: 5 (ref 5–15)
BUN: 17 mg/dL (ref 6–20)
CHLORIDE: 103 mmol/L (ref 101–111)
CO2: 25 mmol/L (ref 22–32)
Calcium: 8.2 mg/dL — ABNORMAL LOW (ref 8.9–10.3)
Creatinine, Ser: 0.79 mg/dL (ref 0.61–1.24)
GFR calc non Af Amer: >60 mL/min (ref >60)
Glucose, Bld: 125 mg/dL — ABNORMAL HIGH (ref 65–99)
Potassium: 3.8 mmol/L (ref 3.5–5.1)
SODIUM: 133 mmol/L — AB (ref 135–145)

## 2016-04-03 MED ORDER — ASPIRIN EC 325 MG PO TBEC: 325.0000 mg | DELAYED_RELEASE_TABLET | Freq: Two times a day (BID) | ORAL | 0 refills | Status: DC

## 2016-04-03 MED ORDER — HYDROCODONE-ACETAMINOPHEN 7.5-325 MG PO TABS: 1.0000 | ORAL_TABLET | ORAL | 0 refills | Status: DC | PRN

## 2016-04-03 MED ORDER — METHOCARBAMOL 500 MG PO TABS: 500.0000 mg | ORAL_TABLET | Freq: Four times a day (QID) | ORAL | 2 refills | Status: DC | PRN

## 2016-04-03 NOTE — Progress Notes (Signed)
Subjective: 1 Day Post-Op Procedure(s) (LRB): RIGHT TOTAL KNEE ARTHROPLASTY (Right) Patient reports pain as mild to right knee.  Reports a good night. Tolerating PO's. Positive flatus. Denies SOB,CP, or calf pain.  Objective: Vital signs in last 24 hours: Temp:  [97.6 F (36.4 C)-98.4 F (36.9 C)] 97.6 F (36.4 C) (01/13 0624) Pulse Rate:  [66-88] 70 (01/13 0624) Resp:  [9-23] 16 (01/13 0624) BP: (121-147)/(65-91) 129/74 (01/13 0624) SpO2:  [94 %-100 %] 95 % (01/13 0624)  Intake/Output from previous day: 01/12 0701 - 01/13 0700 In: 3777.5 [P.O.:960; I.V.:2817.5] Out: 3365 [Urine:3300; Drains:15; Blood:50] Intake/Output this shift: No intake/output data recorded.   Recent Labs  04/03/16 0503  HGB 12.1*    Recent Labs  04/03/16 0503  WBC 13.8*  RBC 4.75  HCT 37.4*  PLT 277    Recent Labs  04/03/16 0503  NA 133*  K 3.8  CL 103  CO2 25  BUN 17  CREATININE 0.79  GLUCOSE 125*  CALCIUM 8.2*   No results for input(s): LABPT, INR in the last 72 hours.  Well nourished. Alert and oriented x3. RRR, Lungs clear, BS x4. Abdomen soft and non tender. Right Calf soft and non tender. Right knee dressing C/D/I. No DVT signs. Compartment soft. No signs of infection.  Right LE grossly neurovascular intact.  Assessment/Plan: 1 Day Post-Op Procedure(s) (LRB): RIGHT TOTAL KNEE ARTHROPLASTY (Right) Drain d/c'ed with tip intact Up with PT D/c home tomorrow with HHPT Follow up in office Continue current care   STILWELL, BRYSON L 04/03/2016, 8:59 AM

## 2016-04-03 NOTE — Care Management Note (Signed)
Case Management Note  Patient Details  Name: Eric Case MRN: [id number] Date of Birth: August 30, 1945  Subjective/Objective:                  RIGHT TOTAL KNEE ARTHROPLASTY (Right) Action/Plan: Discharge planning Expected Discharge Date:  03/1316               Expected Discharge Plan:  Home w Home Health Services  In-House Referral:     Discharge planning Services  CM Consult  Post Acute Care Choice:  Home Health Choice offered to:  Spouse, Patient  DME Arranged:  Walker rolling DME Agency:  [facility]  HH Arranged:  PT HH Agency:  [facility]  Status of Service:  Completed, signed off  If discussed at [employer] Length of Stay Meetings, dates discussed:    Additional Comments: CM spoke with pt/pt's wife for choice of home health agency. Pt chooses [facility] to render HHPT.  Referral called to [facility] rep, [place].  Cm notified [facility] DME rep, Reggie to please deliver the rolling walker to room prior to discharge.  No other CM needs were communicated. Yves DillJeffries, Eric Lipuma [name], RN 04/03/2016, 9:42 AM

## 2016-04-03 NOTE — Evaluation (Signed)
Physical Therapy Evaluation Patient Details Name: Eric Case MRN: [id number] DOB: 05/30/1945 Today's Date: 04/03/2016   History of Present Illness  71 yo male s/p R TKA 04/02/16.   Clinical Impression  On eval, pt required Min guard assist for mobility. He walked ~125 feet with a RW. Pain rated 5/10 with activity. Cues for safety throughout session. Pt is highly motivated. Will follow and progress activity as tolerated.     Follow Up Recommendations Home health PT;Supervision/Assistance - 24 hour    Equipment Recommendations  None recommended by PT    Recommendations for Other Services OT consult     Precautions / Restrictions Precautions Precautions: Fall;Knee Required Braces or Orthoses: Knee Immobilizer - Right Knee Immobilizer - Right: Discontinue once straight leg raise with < 10 degree lag Restrictions Weight Bearing Restrictions: No RLE Weight Bearing: Weight bearing as tolerated      Mobility  Bed Mobility               General bed mobility comments: oob in recliner  Transfers Overall transfer level: Needs assistance Equipment used: Rolling walker (2 wheeled) Transfers: Sit to/from Stand Sit to Stand: Min guard         General transfer comment: close guard for safety. VCs safety, hand/LE placement. Pt stood/sat before walker was safely positioned despite cueing.   Ambulation/Gait Ambulation/Gait assistance: Min guard Ambulation Distance (Feet): 125 Feet Assistive device: Rolling walker (2 wheeled) Gait Pattern/deviations: Step-to pattern;Step-through pattern;Decreased stride length;Antalgic     General Gait Details: close guard for safety.   Stairs            Wheelchair Mobility    Modified Rankin (Stroke Patients Only)       Balance                                             Pertinent Vitals/Pain Pain Assessment: 0-10 Pain Score: 5  Pain Location: R knee Pain Descriptors / Indicators: Aching;Sore Pain  Intervention(s): Monitored during session;Repositioned;Ice applied    Home Living Family/patient expects to be discharged to:: Private residence Living Arrangements: Spouse/significant other Available Help at Discharge: Family Type of Home: House Home Access: Stairs to enter Entrance Stairs-Rails: None Secretary/administratorntrance Stairs-Number of Steps: 2 Home Layout: One level Home Equipment: Environmental consultantWalker - 2 wheels      Prior Function Level of Independence: Independent               Hand Dominance   Dominant Hand: Right    Extremity/Trunk Assessment   Upper Extremity Assessment Upper Extremity Assessment: Defer to OT evaluation    Lower Extremity Assessment Lower Extremity Assessment: Generalized weakness (s/p R TKA)    Cervical / Trunk Assessment Cervical / Trunk Assessment: Normal  Communication   Communication: No difficulties  Cognition Arousal/Alertness: Awake/alert Behavior During Therapy: WFL for tasks assessed/performed Overall Cognitive Status: Within Functional Limits for tasks assessed                      General Comments      Exercises Total Joint Exercises Ankle Circles/Pumps: AROM;Both;10 reps;Supine Quad Sets: AROM;10 reps;Supine Heel Slides: AAROM;Right;10 reps;Supine Hip ABduction/ADduction: AAROM;Right;10 reps;Supine Straight Leg Raises: AAROM;Right;10 reps;Supine Goniometric ROM: ~10-65 degrees   Assessment/Plan    PT Assessment Patient needs continued PT services  PT Problem List Decreased strength;Decreased mobility;Decreased range of motion;Decreased activity tolerance;Decreased balance;Decreased knowledge of  use of DME;Pain          PT Treatment Interventions      PT Goals (Current goals can be found in the Care Plan section)  Acute Rehab PT Goals Patient Stated Goal: get moving PT Goal Formulation: With patient/family Time For Goal Achievement: 04/17/16 Potential to Achieve Goals: Good    Frequency     Barriers to discharge         Co-evaluation               End of Session Equipment Utilized During Treatment: Gait belt;Right knee immobilizer Activity Tolerance: Patient tolerated treatment well Patient left: in chair;with call bell/phone within reach;with family/visitor present;with chair alarm set           Time: 1040-1057 PT Time Calculation (min) (ACUTE ONLY): 17 min   Charges:   PT Evaluation $PT Eval Low Complexity: 1 Procedure     PT G Codes:        Rebeca Alert, MPT Pager: [phone]

## 2016-04-03 NOTE — Evaluation (Signed)
Occupational Therapy Evaluation Patient Details Name: Eric Case MRN: [id number] DOB: 1945-08-21 Today's Date: 04/03/2016    History of Present Illness s/p R TKA   Clinical Impression   This 71 year old man was admitted for the above sx. He will benefit from continued OT in acute to reinforce safety.    Follow Up Recommendations  Supervision/Assistance - 24 hour    Equipment Recommendations  None recommended by OT    Recommendations for Other Services       Precautions / Restrictions Precautions Precautions: Knee;Fall Required Braces or Orthoses: Knee Immobilizer - Right Restrictions Weight Bearing Restrictions: No      Mobility Bed Mobility               General bed mobility comments: min A for RLE  Transfers Overall transfer level: Needs assistance Equipment used: Rolling walker (2 wheeled) Transfers: Sit to/from Stand Sit to Stand: Min guard         General transfer comment: for safety. Cues for LE placement    Balance                                            ADL Overall ADL's : Needs assistance/impaired                         Toilet Transfer: Min guard;Ambulation;RW;Comfort height toilet;Grab bars             General ADL Comments: wife assisted pt with ADL this am.  Practiced toilet transfer and educated on precautions, safety.  Pt is impulsive and needs cues to slow down, keep wallker in front of him and  to extend leg when sitting. Demonstrated shower transfer but did not practice this session.  recommended pt either sponge bathe initially or get a seat to put in shower.  He needs to sit until KI is discontinued. At end of session, pt needed to use bathroom.  Walked to bathroom to use urinal with min guard and safety cues     Vision     Perception     Praxis      Pertinent Vitals/Pain Pain Assessment: 0-10 Pain Score: 2  Pain Location: R knee Pain Descriptors / Indicators: Sore Pain  Intervention(s): Limited activity within patient's tolerance;Monitored during session;Repositioned;Ice applied;Patient requesting pain meds-RN notified     Hand Dominance Right   Extremity/Trunk Assessment Upper Extremity Assessment Upper Extremity Assessment: Overall WFL for tasks assessed           Communication Communication Communication: No difficulties   Cognition Arousal/Alertness: Awake/alert Behavior During Therapy: Impulsive Overall Cognitive Status: Within Functional Limits for tasks assessed                     General Comments       Exercises       Shoulder Instructions      Home Living Family/patient expects to be discharged to:: Private residence Living Arrangements: Spouse/significant other                 Bathroom Shower/Tub: Producer, television/film/video: Standard     Home Equipment: Environmental consultant - 2 wheels;Crutches          Prior Functioning/Environment Level of Independence: Independent  OT Problem List: Pain;Decreased safety awareness   OT Treatment/Interventions: Self-care/ADL training;DME and/or AE instruction;Patient/family education    OT Goals(Current goals can be found in the care plan section) Acute Rehab OT Goals Patient Stated Goal: get moving OT Goal Formulation: With patient Time For Goal Achievement: 04/05/16 Potential to Achieve Goals: Good ADL Goals Additional ADL Goal #1: pt will perform toilet transfer at supervision level and shower transfer at min guard level without cues for safety  OT Frequency: Min 2X/week   Barriers to D/C:            Co-evaluation              End of Session    Activity Tolerance: Patient tolerated treatment well Patient left: in chair;with call bell/phone within reach;with chair alarm set;with family/visitor present   Time: [id number] OT Time Calculation (min): 30 min Charges:  OT General Charges $OT Visit: 1 Procedure OT Evaluation $OT Eval  Low Complexity: 1 Procedure OT Treatments $Self Care/Home Management : 8-22 mins G-Codes:    Eric Case 04/03/2016, 9:42 AM  Eric Case, OTR/L [phone] 04/03/2016

## 2016-04-03 NOTE — Progress Notes (Signed)
Physical Therapy Treatment Patient Details Name: Eric Case MRN: [id number] DOB: 1945-10-16 Today's Date: 04/03/2016    History of Present Illness 71 yo male s/p R TKA 04/02/16.     PT Comments    Progressing well with mobility.   Follow Up Recommendations  Home health PT;Supervision - Intermittent     Equipment Recommendations  None recommended by PT    Recommendations for Other Services       Precautions / Restrictions Precautions Precautions: Fall;Knee Required Braces or Orthoses: Knee Immobilizer - Right Knee Immobilizer - Right: Discontinue once straight leg raise with < 10 degree lag Restrictions Weight Bearing Restrictions: No RLE Weight Bearing: Weight bearing as tolerated    Mobility  Bed Mobility               General bed mobility comments: in bathroom with RN  Transfers Overall transfer level: Needs assistance Equipment used: Rolling walker (2 wheeled) Transfers: Sit to/from Stand Sit to Stand: Min guard         General transfer comment: cues for safety  Ambulation/Gait Ambulation/Gait assistance: Min guard Ambulation Distance (Feet): 165 Feet Assistive device: Rolling walker (2 wheeled) Gait Pattern/deviations: Step-through pattern;Decreased stride length     General Gait Details: cues for safety, pacing.    Stairs            Wheelchair Mobility    Modified Rankin (Stroke Patients Only)       Balance                                    Cognition Arousal/Alertness: Awake/alert Behavior During Therapy: WFL for tasks assessed/performed Overall Cognitive Status: Within Functional Limits for tasks assessed                      Exercises Total Joint Exercises Knee Flexion: AAROM;Right;10 reps;Seated Goniometric ROM: ~10-80 degrees    General Comments        Pertinent Vitals/Pain Pain Assessment: 0-10 Pain Score: 5  Pain Location: R knee Pain Descriptors / Indicators: Aching;Sore Pain  Intervention(s): Monitored during session;Repositioned;Ice applied    Home Living                      Prior Function            PT Goals (current goals can now be found in the care plan section) Progress towards PT goals: Progressing toward goals    Frequency    7X/week      PT Plan Current plan remains appropriate    Co-evaluation             End of Session   Activity Tolerance: Patient tolerated treatment well Patient left: in bed;with call bell/phone within reach     Time: 1420-1430 PT Time Calculation (min) (ACUTE ONLY): 10 min  Charges:  $Gait Training: 8-22 mins                    G Codes:      Rebeca AlertJannie Arvie Bartholomew, MPT Pager: [phone]

## 2016-04-04 LAB — CBC
HCT: 36.5 % — ABNORMAL LOW (ref 39.0–52.0)
HEMOGLOBIN: 11.9 g/dL — AB (ref 13.0–17.0)
MCH: 26.2 pg (ref 26.0–34.0)
MCHC: 32.6 g/dL (ref 30.0–36.0)
MCV: 80.4 fL (ref 78.0–100.0)
Platelets: 260 10*3/uL (ref 150–400)
RBC: 4.54 MIL/uL (ref 4.22–5.81)
RDW: 15.7 % — ABNORMAL HIGH (ref 11.5–15.5)
WBC: 13.1 10*3/uL — ABNORMAL HIGH (ref 4.0–10.5)

## 2016-04-04 NOTE — Discharge Instructions (Signed)

## 2016-04-04 NOTE — Progress Notes (Signed)
     Subjective: 2 Days Post-Op Procedure(s) (LRB): RIGHT TOTAL KNEE ARTHROPLASTY (Right)   Patient reports pain as mild, states that he really hasn't had any significant pain.  No events throughout the night. Feels that he is ready to be discharged home.   Objective:   VITALS:   Vitals:   04/03/16 2150 04/04/16 0632  BP: 133/71 (!) 142/80  Pulse: 72 72  Resp: 20 20  Temp: 98.8 F (37.1 C) 98 F (36.7 C)    Dorsiflexion/Plantar flexion intact Incision: dressing C/D/I No cellulitis present Compartment soft  LABS  Recent Labs  04/03/16 0503 04/04/16 0356  HGB 12.1* 11.9*  HCT 37.4* 36.5*  WBC 13.8* 13.1*  PLT 277 260     Recent Labs  04/03/16 0503  NA 133*  K 3.8  BUN 17  CREATININE 0.79  GLUCOSE 125*     Assessment/Plan: 2 Days Post-Op Procedure(s) (LRB): RIGHT TOTAL KNEE ARTHROPLASTY (Right)  ACE bandage removed HV dressing removed and cleaned Up with therapy Discharge home [facility] Follow up with Dr Thomasena Edisollins in 2 weeks.  Contact information:  [facility] [address], [address] [place] [id number] [phone]        Anastasio AuerbachMatthew S. Anaelle Dunton   PAC  04/04/2016, 8:17 AM

## 2016-04-04 NOTE — Progress Notes (Signed)
Physical Therapy Treatment Patient Details Name: Eric Case MRN: [id number] DOB: [birth year] Today's Date: 04/04/2016    History of Present Illness 71 yo male s/p R TKA 04/02/16.     PT Comments    Pt progressing well with mobility, he ambulated 400' with RW, completed stair training, and reviewed HEP. From PT standpoint he is ready to DC home.   Follow Up Recommendations  Home health PT;Supervision for mobility/OOB     Equipment Recommendations  None recommended by PT    Recommendations for Other Services       Precautions / Restrictions Precautions Precautions: Fall;Knee Required Braces or Orthoses: Knee Immobilizer - Right Knee Immobilizer - Right: Discontinue once straight leg raise with < 10 degree lag Restrictions RLE Weight Bearing: Weight bearing as tolerated    Mobility  Bed Mobility Overal bed mobility: Modified Independent             General bed mobility comments: HOB up 30*  Transfers Overall transfer level: Needs assistance Equipment used: Rolling walker (2 wheeled) Transfers: Sit to/from Stand Sit to Stand: Supervision         General transfer comment: cues for safety  Ambulation/Gait Ambulation/Gait assistance: Modified independent (Device/Increase time) Ambulation Distance (Feet): 400 Feet Assistive device: Rolling walker (2 wheeled) Gait Pattern/deviations: WFL(Within Functional Limits)   Gait velocity interpretation: at or above normal speed for age/gender General Gait Details: steady with RW, no LOB,    Stairs Stairs: Yes   Stair Management: No rails;Step to pattern;Backwards Number of Stairs: 2 General stair comments: min A to steady RW, wife present for stair training  Wheelchair Mobility    Modified Rankin (Stroke Patients Only)       Balance Overall balance assessment: Modified Independent                                  Cognition Arousal/Alertness: Awake/alert Behavior During Therapy: WFL for  tasks assessed/performed Overall Cognitive Status: Within Functional Limits for tasks assessed                      Exercises Total Joint Exercises Ankle Circles/Pumps: AROM;Both;10 reps;Supine Quad Sets: AROM;10 reps;Supine Short Arc Quad: AROM;Right;10 reps Heel Slides: AAROM;Right;10 reps;Supine Straight Leg Raises: Right;10 reps;Supine;AROM Long Arc Quad: AROM;Right;10 reps;Seated Knee Flexion: AAROM;Right;5 reps;Seated Goniometric ROM: 8-75* AAROM R knee    General Comments        Pertinent Vitals/Pain Pain Score: 3  Pain Location: R knee Pain Descriptors / Indicators: Sore Pain Intervention(s): Limited activity within patient's tolerance;Monitored during session;Premedicated before session;Ice applied    Home Living                      Prior Function            PT Goals (current goals can now be found in the care plan section) Acute Rehab PT Goals Patient Stated Goal: hiking, walking 5k PT Goal Formulation: With patient/family Time For Goal Achievement: 04/17/16 Potential to Achieve Goals: Good Progress towards PT goals: Goals met/education completed, patient discharged from PT    Frequency    7X/week      PT Plan Current plan remains appropriate    Co-evaluation             End of Session Equipment Utilized During Treatment: Gait belt Activity Tolerance: Patient tolerated treatment well Patient left: with call bell/phone within reach;in chair;with family/visitor  present     Time: 3013-1438 PT Time Calculation (min) (ACUTE ONLY): 28 min  Charges:  $Gait Training: 8-22 mins $Therapeutic Exercise: 8-22 mins                    G Codes:      Philomena Doheny 04/04/2016, 9:18 AM [phone]

## 2016-04-04 NOTE — Progress Notes (Signed)
Occupational Therapy Treatment Patient Details Name: Eric Case MRN: [id number] DOB: 08-22-1945 Today's Date: 04/04/2016    History of present illness 71 yo male s/p R TKA 04/02/16.    OT comments  Performed bathroom transfers. Needs reinforcement for safety with HHPT. Plan is for home today  Follow Up Recommendations  Supervision/Assistance - 24 hour    Equipment Recommendations  None recommended by OT    Recommendations for Other Services      Precautions / Restrictions Precautions Precautions: Fall;Knee Required Braces or Orthoses: Knee Immobilizer - Right Knee Immobilizer - Right: Discontinue once straight leg raise with < 10 degree lag Restrictions RLE Weight Bearing: Weight bearing as tolerated       Mobility Bed Mobility Overal bed mobility: Modified Independent             General bed mobility comments: HOB up 30*  Transfers Overall transfer level: Needs assistance Equipment used: Rolling walker (2 wheeled) Transfers: Sit to/from Stand Sit to Stand: Supervision         General transfer comment: cues for safety    Balance Overall balance assessment: Modified Independent                                 ADL Overall ADL's : Needs assistance/impaired                         Toilet Transfer: Min guard;Ambulation;RW;Comfort height toilet;Grab bars       Tub/ Shower Transfer: Walk-in shower;Min guard;Ambulation     General ADL Comments: pt continues to move quickly.  Practiced the above transfers. Cues for UE placement for sit to stand, keeping legs inside of walker when sitting and starting with a seated shower 2* using KI.        Vision                     Perception     Praxis      Cognition   Behavior During Therapy: WFL for tasks assessed/performed Overall Cognitive Status: Within Functional Limits for tasks assessed                       Extremity/Trunk Assessment                Exercises    Shoulder Instructions       General Comments      Pertinent Vitals/ Pain       Pain Score: 3  Pain Location: R knee Pain Descriptors / Indicators: Sore Pain Intervention(s): Limited activity within patient's tolerance;Monitored during session;Premedicated before session;Ice applied  Home Living                                          Prior Functioning/Environment              Frequency           Progress Toward Goals  OT Goals(current goals can now be found in the care plan section)  Progress towards OT goals: Progressing toward goals (needs reinforcement for safety with HHPT)     Plan      Co-evaluation                 End of Session  Activity Tolerance Patient tolerated treatment well   Patient Left in bed;with call bell/phone within reach;with family/visitor present   Nurse Communication          Time: 1610-96040807-0825 OT Time Calculation (min): 18 min  Charges: OT General Charges $OT Visit: 1 Procedure OT Treatments $Self Care/Home Management : 8-22 mins  Eric Case 04/04/2016, 9:17 AM Eric Case, OTR/L [phone] 04/04/2016

## 2016-04-05 ENCOUNTER — Emergency Department (HOSPITAL_BASED_OUTPATIENT_CLINIC_OR_DEPARTMENT_OTHER)
Admit: 2016-04-05 | Discharge: 2016-04-05 | Disposition: A | Discharge status: Home / Self Care | Payer: BLUE CROSS/BLUE SHIELD | Attending: Emergency Medicine | Admitting: Emergency Medicine

## 2016-04-05 ENCOUNTER — Encounter (HOSPITAL_COMMUNITY): Payer: Self-pay | Admitting: Emergency Medicine

## 2016-04-05 ENCOUNTER — Emergency Department (HOSPITAL_COMMUNITY): Payer: BLUE CROSS/BLUE SHIELD

## 2016-04-05 ENCOUNTER — Emergency Department (HOSPITAL_COMMUNITY)
Admission: EM | Admit: 2016-04-05 | Discharge: 2016-04-05 | Disposition: A | Discharge status: Home / Self Care | Payer: BLUE CROSS/BLUE SHIELD | Attending: Emergency Medicine | Admitting: Emergency Medicine

## 2016-04-05 DIAGNOSIS — E039 Hypothyroidism, unspecified: Secondary | ICD-10-CM | POA: Insufficient documentation

## 2016-04-05 DIAGNOSIS — R609 Edema, unspecified: Secondary | ICD-10-CM | POA: Diagnosis not present

## 2016-04-05 DIAGNOSIS — Z7982 Long term (current) use of aspirin: Secondary | ICD-10-CM | POA: Diagnosis not present

## 2016-04-05 DIAGNOSIS — Z79899 Other long term (current) drug therapy: Secondary | ICD-10-CM | POA: Diagnosis not present

## 2016-04-05 DIAGNOSIS — R55 Syncope and collapse: Secondary | ICD-10-CM

## 2016-04-05 DIAGNOSIS — Z96651 Presence of right artificial knee joint: Secondary | ICD-10-CM | POA: Insufficient documentation

## 2016-04-05 LAB — CBC
HEMATOCRIT: 36.7 % — AB (ref 39.0–52.0)
Hemoglobin: 11.9 g/dL — ABNORMAL LOW (ref 13.0–17.0)
MCH: 26.3 pg (ref 26.0–34.0)
MCHC: 32.4 g/dL (ref 30.0–36.0)
MCV: 81.2 fL (ref 78.0–100.0)
Platelets: 232 10*3/uL (ref 150–400)
RBC: 4.52 MIL/uL (ref 4.22–5.81)
RDW: 15.8 % — ABNORMAL HIGH (ref 11.5–15.5)
WBC: 11 10*3/uL — ABNORMAL HIGH (ref 4.0–10.5)

## 2016-04-05 LAB — BASIC METABOLIC PANEL
Anion gap: 7 (ref 5–15)
BUN: 18 mg/dL (ref 6–20)
CO2: 29 mmol/L (ref 22–32)
CREATININE: 0.94 mg/dL (ref 0.61–1.24)
Calcium: 8.2 mg/dL — ABNORMAL LOW (ref 8.9–10.3)
Chloride: 100 mmol/L — ABNORMAL LOW (ref 101–111)
GFR calc non Af Amer: >60 mL/min (ref >60)
Glucose, Bld: 132 mg/dL — ABNORMAL HIGH (ref 65–99)
POTASSIUM: 4 mmol/L (ref 3.5–5.1)
SODIUM: 136 mmol/L (ref 135–145)

## 2016-04-05 LAB — DIFFERENTIAL
BASOS PCT: 1 %
Basophils Absolute: 0.1 10*3/uL (ref 0.0–0.1)
EOS ABS: 0.3 10*3/uL (ref 0.0–0.7)
Eosinophils Relative: 2 %
Lymphocytes Relative: 12 %
Lymphs Abs: 1.3 10*3/uL (ref 0.7–4.0)
MONO ABS: 1.5 10*3/uL — AB (ref 0.1–1.0)
MONOS PCT: 14 %
Neutro Abs: 7.9 10*3/uL — ABNORMAL HIGH (ref 1.7–7.7)
Neutrophils Relative %: 71 %

## 2016-04-05 LAB — URINALYSIS, ROUTINE W REFLEX MICROSCOPIC
BILIRUBIN URINE: NEGATIVE
Glucose, UA: NEGATIVE mg/dL
KETONES UR: NEGATIVE mg/dL
LEUKOCYTES UA: NEGATIVE
Nitrite: NEGATIVE
PROTEIN: NEGATIVE mg/dL
Specific Gravity, Urine: 1.014 (ref 1.005–1.030)
pH: 7 (ref 5.0–8.0)

## 2016-04-05 LAB — I-STAT TROPONIN, ED: Troponin i, poc: 0 ng/mL (ref 0.00–0.08)

## 2016-04-05 LAB — PROTIME-INR
INR: 0.99
PROTHROMBIN TIME: 13.1 s (ref 11.4–15.2)

## 2016-04-05 LAB — CBG MONITORING, ED: Glucose-Capillary: 122 mg/dL — ABNORMAL HIGH (ref 65–99)

## 2016-04-05 MED ORDER — IOPAMIDOL (ISOVUE-370) INJECTION 76%
INTRAVENOUS | Status: AC
  Filled 2016-04-05: qty 100

## 2016-04-05 MED ORDER — IOPAMIDOL (ISOVUE-370) INJECTION 76%
100.0000 mL | Freq: Once | INTRAVENOUS | Status: AC | PRN
  Administered 2016-04-05: 100 mL via INTRAVENOUS

## 2016-04-05 MED ORDER — DOXYCYCLINE HYCLATE 100 MG PO CAPS: 100.0000 mg | ORAL_CAPSULE | Freq: Two times a day (BID) | ORAL | 0 refills | Status: DC

## 2016-04-05 MED ORDER — SODIUM CHLORIDE 0.9 % IV SOLN
INTRAVENOUS | Status: DC
  Administered 2016-04-05: 10:00:00 via INTRAVENOUS

## 2016-04-05 NOTE — ED Notes (Signed)
Vascular US at bedside.

## 2016-04-05 NOTE — ED Notes (Signed)
ED Provider at bedside. 

## 2016-04-05 NOTE — ED Notes (Signed)
Bed: WA12 Expected date:  Expected time:  Means of arrival:  Comments: EMS 

## 2016-04-05 NOTE — ED Notes (Signed)
Patient transported to CT 

## 2016-04-05 NOTE — Progress Notes (Addendum)
VASCULAR LAB PRELIMINARY  PRELIMINARY  PRELIMINARY  PRELIMINARY  Right lower extremity venous duplex completed.    Preliminary report:  There is no DVT or SVT noted in the right lower extremity.   Gave report to Dr. Berniece PapKnapp  Quantavis Obryant, [facility]AND[facility], RVT 04/05/2016, 11:40 AM

## 2016-04-05 NOTE — Discharge Instructions (Signed)
Return to the emergency room if you have any recurrent episodes, follow-up with your primary care doctor, as we discussed a CT scan suggested an area of inflammation along, the doxycycline is an antibiotic to cover for pulmonary infection

## 2016-04-05 NOTE — ED Triage Notes (Signed)
Pt had syncopal episode this am while eating breakfast. Family told EMS pt was unconscious for about 30 seconds. Had knee replacement on Friday, has not had am pain medication yet. Pt denies SOB, lightheadedness or dizziness.

## 2016-04-05 NOTE — Addendum Note (Signed)
Addendum  created 04/05/16 1620 by Arta BruceKevin Doctor Sheahan, MD   Anesthesia Intra Blocks edited, Sign clinical note

## 2016-04-05 NOTE — ED Provider Notes (Signed)
[facility]-EMERGENCY DEPT Provider Note   CSN: [id number] Arrival date & time: 04/05/16  4098     History   Chief Complaint Chief Complaint  Patient presents with  . Loss of Consciousness    HPI Eric Case is a 71 y.o. male.  HPI Pt was at home.  He started to feel lightheaded.  He felt like he was going to pass out.  He was on a stool so he moved to another chair that he could sit on.  HIs wife noticed his speech was slurred and he looked dazed.  He started gasping for breath, sounded like he choked and then he fell over.  That was less than a minute.  He had LOC for less than 1 minute.  He was then immediately alert like nothing had happened when he regained consciousness.  He was just discharged from the hospital after having knee surgery. Past Medical History:  Diagnosis Date  . Anemia    on Iron- comes and goes per patient   . Arthritis   . Cataract    starting but very small  . Complication of anesthesia    severe headache after esophagus stretched years ago  . Diverticulitis   . Diverticulosis   . GERD (gastroesophageal reflux disease)   . Hemorrhoids   . Hiatal hernia   . Hx of adenomatous colonic polyps   . Hypothyroidism   . Peptic stricture of esophagus     Patient Active Problem List   Diagnosis Date Noted  . S/P knee replacement 04/02/2016  . Primary osteoarthritis of one knee, right 04/02/2016  . ESOPHAGEAL STRICTURE 12/31/2009  . GERD 12/31/2009  . DIVERTICULITIS OF COLON 12/31/2009  . PERSONAL HISTORY OF COLONIC POLYPS 12/31/2009    Past Surgical History:  Procedure Laterality Date  . COLONOSCOPY    . KNEE SURGERY Right    tendon tear   . MENISCUS REPAIR Right    2015 and May 2017  . POLYPECTOMY    . SHOULDER SURGERY     bilateral  . TOTAL KNEE ARTHROPLASTY Right 04/02/2016   Procedure: RIGHT TOTAL KNEE ARTHROPLASTY;  Surgeon: Eugenia Mcalpine, MD;  Location: [facility];  Service: Orthopedics;  Laterality: Right;  Adductor Block  . UPPER  GASTROINTESTINAL ENDOSCOPY         Home Medications    Prior to Admission medications   Medication Sig Start Date End Date Taking? Authorizing Provider  Ascorbic Acid (VITAMIN C) 1000 MG tablet Take 1,000 mg by mouth daily.     Yes Historical Provider, MD  aspirin EC 325 MG tablet Take 1 tablet (325 mg total) by mouth 2 (two) times daily. 04/03/16  Yes Bryson L Stilwell, PA-C  co-enzyme Q-10 30 MG capsule Take 30 mg by mouth daily.    Yes Historical Provider, MD  Dextromethorphan-Guaifenesin (MUCINEX DM PO) Take 20 mLs by mouth every 6 (six) hours as needed (cough).    Yes Historical Provider, MD  ferrous sulfate 325 (65 FE) MG tablet Take 325 mg by mouth daily as needed (for iron deficiency related to prevacid).    Yes Historical Provider, MD  GLUCOSAMINE HCL-MSM PO Take 1,500 mg by mouth 2 (two) times daily.   Yes Historical Provider, MD  HYDROcodone-acetaminophen (NORCO) 7.5-325 MG tablet Take 1-2 tablets by mouth every 4 (four) hours as needed (breakthrough pain). 04/03/16  Yes Bryson L Stilwell, PA-C  lansoprazole (PREVACID) 30 MG capsule Take 30 mg by mouth daily at 2 PM.    Yes Historical Provider,  MD  levothyroxine (SYNTHROID, LEVOTHROID) 112 MCG tablet Take 112 mcg by mouth daily.     Yes Historical Provider, MD  methocarbamol (ROBAXIN) 500 MG tablet Take 1 tablet (500 mg total) by mouth every 6 (six) hours as needed for muscle spasms. 04/03/16  Yes Bryson L Stilwell, PA-C  Multiple Vitamin (MULTIVITAMIN WITH MINERALS) TABS tablet Take 1 tablet by mouth daily.   Yes Historical Provider, MD  Nutritional Supplements (SALMON OIL) CAPS Take 1 capsule by mouth daily.   Yes Historical Provider, MD  Saw Palmetto, Serenoa repens, (SAW PALMETTO PO) Take 250 mg by mouth 2 (two) times daily.    Yes Historical Provider, MD  vitamin E 400 UNIT capsule Take 400 Units by mouth daily.     Yes Historical Provider, MD  doxycycline (VIBRAMYCIN) 100 MG capsule Take 1 capsule (100 mg total) by mouth 2 (two)  times daily. 04/05/16   Linwood Dibbles, MD    Family History Family History  Problem Relation Age of Onset  . Breast cancer Mother   . Heart disease Mother   . Colon cancer Cousin   . Liver cancer Father   . Prostate cancer Father   . Prostate cancer Maternal Uncle   . Prostate cancer Maternal Uncle   . Prostate cancer Maternal Uncle   . Colon polyps Neg Hx   . Esophageal cancer Neg Hx   . Rectal cancer Neg Hx   . Stomach cancer Neg Hx     Social History Social History  Substance Use Topics  . Smoking status: Never Smoker  . Smokeless tobacco: Never Used  . Alcohol use No     Allergies   Ciprofloxacin; Hydromorphone; Oxycodone-acetaminophen; Pennsaid [diclofenac sodium]; and Sulfamethoxazole-trimethoprim   Review of Systems Review of Systems  Respiratory: Negative for chest tightness and shortness of breath.   Musculoskeletal: Positive for joint swelling (after knee surgery).  Neurological: Negative for numbness and headaches.  Psychiatric/Behavioral: Negative for confusion.  All other systems reviewed and are negative.    Physical Exam Updated Vital Signs BP 138/79   Pulse 93   Temp 98.1 F (36.7 C) (Oral)   Resp 21   SpO2 95%   Physical Exam  Constitutional: He appears well-developed and well-nourished. No distress.  HENT:  Head: Normocephalic and atraumatic.  Right Ear: External ear normal.  Left Ear: External ear normal.  Eyes: Conjunctivae are normal. Right eye exhibits no discharge. Left eye exhibits no discharge. No scleral icterus.  Neck: Neck supple. No tracheal deviation present.  Cardiovascular: Normal rate, regular rhythm and intact distal pulses.   Pulmonary/Chest: Effort normal and breath sounds normal. No stridor. No respiratory distress. He has no wheezes. He has no rales.  Abdominal: Soft. Bowel sounds are normal. He exhibits no distension. There is no tenderness. There is no rebound and no guarding.  Musculoskeletal: He exhibits edema. He  exhibits no tenderness.  S/p r knee surgery, bandage in place, no erythema, mild bruising, edema of the right calf  Neurological: He is alert. He has normal strength. No cranial nerve deficit (no facial droop, extraocular movements intact, no slurred speech) or sensory deficit. He exhibits normal muscle tone. He displays no seizure activity. Coordination normal.  Skin: Skin is warm and dry. No rash noted.  Psychiatric: He has a normal mood and affect.  Nursing note and vitals reviewed.    ED Treatments / Results  Labs (all labs ordered are listed, but only abnormal results are displayed) Labs Reviewed  BASIC METABOLIC PANEL -  Abnormal; Notable for the following:       Result Value   Chloride 100 (*)    Glucose, Bld 132 (*)    Calcium 8.2 (*)    All other components within normal limits  CBC - Abnormal; Notable for the following:    WBC 11.0 (*)    Hemoglobin 11.9 (*)    HCT 36.7 (*)    RDW 15.8 (*)    All other components within normal limits  URINALYSIS, ROUTINE W REFLEX MICROSCOPIC - Abnormal; Notable for the following:    APPearance HAZY (*)    Hgb urine dipstick SMALL (*)    Bacteria, UA FEW (*)    Squamous Epithelial / LPF 0-5 (*)    All other components within normal limits  DIFFERENTIAL - Abnormal; Notable for the following:    Neutro Abs 7.9 (*)    Monocytes Absolute 1.5 (*)    All other components within normal limits  CBG MONITORING, ED - Abnormal; Notable for the following:    Glucose-Capillary 122 (*)    All other components within normal limits  PROTIME-INR  CBC WITH DIFFERENTIAL/PLATELET  POCT CBG (FASTING - GLUCOSE)-MANUAL ENTRY  I-STAT TROPOININ, ED    EKG  EKG Interpretation  Date/Time:  Monday April 05 2016 08:50:32 EST Ventricular Rate:  72 PR Interval:    QRS Duration: 109 QT Interval:  404 QTC Calculation: 443 R Axis:   -44 Text Interpretation:  Sinus rhythm Left axis deviation No significant change since last tracing Confirmed by Righteous Claiborne   MD-J, Jennell Janosik ([id number]) on 04/05/2016 9:28:40 AM       Radiology Ct Angio Chest Pe W And/or Wo Contrast  Result Date: 04/05/2016 CLINICAL DATA:  Cough and cold which began on Thursday. Recent knee surgery. Syncope. EXAM: CT ANGIOGRAPHY CHEST WITH CONTRAST TECHNIQUE: Multidetector CT imaging of the chest was performed using the standard protocol during bolus administration of intravenous contrast. Multiplanar CT image reconstructions and MIPs were obtained to evaluate the vascular anatomy. CONTRAST:  100 cc of Isovue 370 COMPARISON:  08/06/2015 FINDINGS: Cardiovascular: Normal heart size. Aortic atherosclerosis. Calcification in the LAD and RCA coronary artery. The main pulmonary artery is patent. No saddle embolus. The lobar pulmonary arteries are also patent. There is respiratory motion artifact which diminishes detail within the segmental and subsegmental pulmonary arteries. Mediastinum/Nodes: The trachea appears patent and is midline. Moderate size hiatal hernia. Prominent mediastinal lymph nodes are identified without evidence for adenopathy. No axillary or supraclavicular adenopathy. Lungs/Pleura: There is no pleural fluid. Patchy peribronchovascular nodular densities are identified within the left upper lobe posteriorly, image number 31 of series 11. These measure up to 7 mm, image 31 of series 11. Favor post infectious or inflammatory process. Upper Abdomen: Calcified granulomas identified within the liver. No acute abnormality identified. Musculoskeletal: No aggressive lytic or sclerotic bone lesions. Review of the MIP images confirms the above findings. IMPRESSION: 1. Exam detail is slightly diminished secondary to respiratory motion artifact. Diminished detail within the segmental pulmonary arteries. Within this limitation, no pulmonary embolus identified. 2. Patchy peribronchovascular nodularity in the posterior left upper lobe is favored to be inflammatory or infectious in etiology. Non-contrast chest  CT at 3-6 months is recommended. If the nodules are stable at time of repeat CT, then future CT at 18-24 months (from today's scan) is considered optional for low-risk patients, but is recommended for high-risk patients. This recommendation follows the consensus statement: Guidelines for Management of Incidental Pulmonary Nodules Detected on CT Images: From the Fleischner  Society 2017; Radiology 2017; [id number]:[id number]. 3. Prior granulomatous disease 4. Aortic atherosclerosis and coronary artery calcification. Electronically Signed   By: Signa Kellaylor  Stroud M.D.   On: 04/05/2016 11:34   Dg Chest Port 1 View  Result Date: 04/05/2016 CLINICAL DATA:  71 year old male with syncopal episode. Cough. Congestion since surgery last week. Nonsmoker. Initial encounter. EXAM: PORTABLE CHEST 1 VIEW COMPARISON:  08/06/2015 chest CT. FINDINGS: Heart size top-normal. Minimal left peribronchial thickening. There is may be normal versus minimal bronchitis type changes. No infiltrate, congestive heart failure or pneumothorax. Tracheomalacia as noted on prior CT. No plain film evidence of pulmonary malignancy. Mildly tortuous aorta. No acute osseous abnormality. IMPRESSION: Minimal peribronchial thickening. This may be normal versus minimal bronchitis type changes. No focal consolidation or pulmonary edema. Tracheomalacia. Electronically Signed   By: Lacy DuverneySteven  Olson M.D.   On: 04/05/2016 10:40    Procedures Procedures (including critical care time)  Medications Ordered in ED Medications  0.9 %  sodium chloride infusion ( Intravenous New Bag/Given 04/05/16 1023)  iopamidol (ISOVUE-370) 76 % injection (not administered)  iopamidol (ISOVUE-370) 76 % injection 100 mL (100 mLs Intravenous Contrast Given 04/05/16 1108)     Initial Impression / Assessment and Plan / ED Course  I have reviewed the triage vital signs and the nursing notes.  Pertinent labs & imaging results that were available during my care of the patient were reviewed by  me and considered in my medical decision making (see chart for details).  Clinical Course as of Apr 06 1335  Mon Apr 05, 2016  1201 DVT study negative  [JK]  1201 CXR negative  [JK]  1202 CT no PE.  Possible infection.  Will need repeat CT scan in 3-6 months  [JK]    Clinical Course User Index [JK] Linwood DibblesJon Francesco Provencal, MD    Patient's laboratory tests are reassuring. He is not anemic. He is not dehydrated. CT scan does not show any evidence of pulmonary embolism. Doppler study of his leg is negative for DVT. EKG does not show any acute dysrhythmia.    No clear etiology for the patient's syncopal episode. That's possible this could have been a vasovagal event. I can't exclude the possibility of a cardiac dysrhythmia. I discussed with the patient being admitted for observation and serial testing. Patient discussed with his wife and decided that he did not want to be admitted and wanted to go home. He is feeling fine and has no complaints.  I did discuss the CT scan findings with the patient and the recommendation for a repeat scan in 3-6 months.  Patient presents return if he has any recurrent episodes.  Final Clinical Impressions(s) / ED Diagnoses   Final diagnoses:  Syncope, unspecified syncope type    New Prescriptions New Prescriptions   DOXYCYCLINE (VIBRAMYCIN) 100 MG CAPSULE    Take 1 capsule (100 mg total) by mouth 2 (two) times daily.     Linwood DibblesJon Corayma Cashatt, MD 04/05/16 [phone]

## 2016-04-09 NOTE — Discharge Summary (Signed)
Physician Discharge Summary  Patient ID: Eric Case MRN: [id number] DOB/AGE: 69-Aug-1947 69 y.o.  Admit date: 04/02/2016 Discharge date: 04/09/2016  Admission Diagnoses:knee OA  Discharge Diagnoses:  Active Problems:   S/P knee replacement   Primary osteoarthritis of one knee, right   Discharged Condition: good  Hospital Course: Eric Case is a 71 y.o. who was admitted to [facility]. They were brought to the operating room on 04/02/2016 and underwent Procedure(s): RIGHT TOTAL KNEE ARTHROPLASTY.  Patient tolerated the procedure well and was later transferred to the recovery room and then to the orthopaedic floor for postoperative care.  They were given PO and IV analgesics for pain control following their surgery.  They were given 24 hours of postoperative antibiotics of  Anti-infectives    Start     Dose/Rate Route Frequency Ordered Stop   04/02/16 1600  ceFAZolin (ANCEF) IVPB 2g/100 mL premix     2 g 200 mL/hr over 30 Minutes Intravenous Every 6 hours 04/02/16 1435 04/02/16 2251   04/02/16 0817  ceFAZolin (ANCEF) IVPB 2g/100 mL premix     2 g 200 mL/hr over 30 Minutes Intravenous On call to O.R. 04/02/16 0818 04/02/16 1041     and started on DVT prophylaxis in the form of lovenox.   PT and OT were ordered for total joint protocol.  Discharge planning consulted to help with postop disposition and equipment needs.  Patient had a good night on the evening of surgery and started to get up OOB with therapy on day one.  Hemovac drain was pulled without difficulty.  Continued to work with therapy into day two.  Dressing was with normal limits.  The patient had progressed with therapy and meeting their goals. Patient was seen in rounds and was ready to go home.  Consults: n/a  Significant Diagnostic Studies: routine  Treatments: routine  Discharge Exam: Blood pressure (!) 142/80, pulse 72, temperature 98 F (36.7 C), temperature source Oral, resp. rate 20, SpO2 96  %. Well nourished. Alert and oriented x3. RRR, Lungs clear, BS x4. Abdomen soft and non tender. Right Calf soft and non tender. Right knee dressing C/D/I. No DVT signs. Compartment soft. No signs of infection.  Right LE grossly neurovascular intact.  Disposition: 01-Home or Self Care  Discharge Instructions    Call MD / Call 911    Complete by:  As directed    If you experience chest pain or shortness of breath, CALL 911 and be transported to the hospital emergency room.  If you develope a fever above 101 F, pus (white drainage) or increased drainage or redness at the wound, or calf pain, call your surgeon's office.   Constipation Prevention    Complete by:  As directed    Drink plenty of fluids.  Prune juice may be helpful.  You may use a stool softener, such as Colace (over the counter) 100 mg twice a day.  Use MiraLax (over the counter) for constipation as needed.   Diet - low sodium heart healthy    Complete by:  As directed    Discharge instructions    Complete by:  As directed    INSTRUCTIONS AFTER JOINT REPLACEMENT   Remove items at home which could result in a fall. This includes throw rugs or furniture in walking pathways ICE to the affected joint every three hours while awake for 30 minutes at a time, for at least the first 3-5 days, and then as needed for pain and swelling.  Continue to use ice for pain and swelling. You may notice swelling that will progress down to the foot and ankle.  This is normal after surgery.  Elevate your leg when you are not up walking on it.   Continue to use the breathing machine you got in the hospital (incentive spirometer) which will help keep your temperature down.  It is common for your temperature to cycle up and down following surgery, especially at night when you are not up moving around and exerting yourself.  The breathing machine keeps your lungs expanded and your temperature down.   DIET:  As you were doing prior to hospitalization, we  recommend a well-balanced diet.  DRESSING / WOUND CARE / SHOWERING  Keep the surgical dressing until follow up.  The dressing is water proof, so you can shower without any extra covering.  IF THE DRESSING FALLS OFF or the wound gets wet inside, change the dressing with sterile gauze.  Please use good hand washing techniques before changing the dressing.  Do not use any lotions or creams on the incision until instructed by your surgeon.    ACTIVITY  Increase activity slowly as tolerated, but follow the weight bearing instructions below.   No driving for 6 weeks or until further direction given by your physician.  You cannot drive while taking narcotics.  No lifting or carrying greater than 10 lbs. until further directed by your surgeon. Avoid periods of inactivity such as sitting longer than an hour when not asleep. This helps prevent blood clots.  You may return to work once you are authorized by your doctor.     WEIGHT BEARING   Weight bearing as tolerated with assist device (walker, cane, etc) as directed, use it as long as suggested by your surgeon or therapist, typically at least 4-6 weeks.   EXERCISES  Results after joint replacement surgery are often greatly improved when you follow the exercise, range of motion and muscle strengthening exercises prescribed by your doctor. Safety measures are also important to protect the joint from further injury. Any time any of these exercises cause you to have increased pain or swelling, decrease what you are doing until you are comfortable again and then slowly increase them. If you have problems or questions, call your caregiver or physical therapist for advice.   Rehabilitation is important following a joint replacement. After just a few days of immobilization, the muscles of the leg can become weakened and shrink (atrophy).  These exercises are designed to build up the tone and strength of the thigh and leg muscles and to improve motion. Often  times heat used for twenty to thirty minutes before working out will loosen up your tissues and help with improving the range of motion but do not use heat for the first two weeks following surgery (sometimes heat can increase post-operative swelling).   These exercises can be done on a training (exercise) mat, on the floor, on a table or on a bed. Use whatever works the best and is most comfortable for you.    Use music or television while you are exercising so that the exercises are a pleasant break in your day. This will make your life better with the exercises acting as a break in your routine that you can look forward to.   Perform all exercises about fifteen times, three times per day or as directed.  You should exercise both the operative leg and the other leg as well.   Exercises include:  Quad Sets - Tighten up the muscle on the front of the thigh (Quad) and hold for 5-10 seconds.   Straight Leg Raises - With your knee straight (if you were given a brace, keep it on), lift the leg to 60 degrees, hold for 3 seconds, and slowly lower the leg.  Perform this exercise against resistance later as your leg gets stronger.  Leg Slides: Lying on your back, slowly slide your foot toward your buttocks, bending your knee up off the floor (only go as far as is comfortable). Then slowly slide your foot back down until your leg is flat on the floor again.  Angel Wings: Lying on your back spread your legs to the side as far apart as you can without causing discomfort.  Hamstring Strength:  Lying on your back, push your heel against the floor with your leg straight by tightening up the muscles of your buttocks.  Repeat, but this time bend your knee to a comfortable angle, and push your heel against the floor.  You may put a pillow under the heel to make it more comfortable if necessary.   A rehabilitation program following joint replacement surgery can speed recovery and prevent re-injury in the future due to  weakened muscles. Contact your doctor or a physical therapist for more information on knee rehabilitation.    CONSTIPATION  Constipation is defined medically as fewer than three stools per week and severe constipation as less than one stool per week.  Even if you have a regular bowel pattern at home, your normal regimen is likely to be disrupted due to multiple reasons following surgery.  Combination of anesthesia, postoperative narcotics, change in appetite and fluid intake all can affect your bowels.   YOU MUST use at least one of the following options; they are listed in order of increasing strength to get the job done.  They are all available over the counter, and you may need to use some, POSSIBLY even all of these options:    Drink plenty of fluids (prune juice may be helpful) and high fiber foods Colace 100 mg by mouth twice a day  Senokot for constipation as directed and as needed Dulcolax (bisacodyl), take with full glass of water  Miralax (polyethylene glycol) once or twice a day as needed.  If you have tried all these things and are unable to have a bowel movement in the first 3-4 days after surgery call either your surgeon or your primary doctor.    If you experience loose stools or diarrhea, hold the medications until you stool forms back up.  If your symptoms do not get better within 1 week or if they get worse, check with your doctor.  If you experience "the worst abdominal pain ever" or develop nausea or vomiting, please contact the office immediately for further recommendations for treatment.   ITCHING:  If you experience itching with your medications, try taking only a single pain pill, or even half a pain pill at a time.  You can also use Benadryl over the counter for itching or also to help with sleep.   TED HOSE STOCKINGS:  Use stockings on both legs until for at least 2 weeks or as directed by physician office. They may be removed at night for sleeping.  MEDICATIONS:  See  your medication summary on the "After Visit Summary" that nursing will review with you.  You may have some home medications which will be placed on hold until you complete the course  of blood thinner medication.  It is important for you to complete the blood thinner medication as prescribed.  PRECAUTIONS:  If you experience chest pain or shortness of breath - call 911 immediately for transfer to the hospital emergency department.   If you develop a fever greater that 101 F, purulent drainage from wound, increased redness or drainage from wound, foul odor from the wound/dressing, or calf pain - CONTACT YOUR SURGEON.                                                   FOLLOW-UP APPOINTMENTS:  If you do not already have a post-op appointment, please call the office for an appointment to be seen by your surgeon.  Guidelines for how soon to be seen are listed in your "After Visit Summary", but are typically between 1-4 weeks after surgery.  OTHER INSTRUCTIONS:   Knee Replacement:  Do not place pillow under knee, focus on keeping the knee straight while resting. CPM instructions: 0-90 degrees, 2 hours in the morning, 2 hours in the afternoon, and 2 hours in the evening. Place foam block, curve side up under heel at all times except when in CPM or when walking.  DO NOT modify, tear, cut, or change the foam block in any way.  MAKE SURE YOU:  Understand these instructions.  Get help right away if you are not doing well or get worse.    Thank you for letting us be a part of your medical care team.  It is a privilege we respect greatly.  We hope these instructions will help you stay on track for a fast and full recovery!   Increase activity slowly as tolerated    Complete by:  As directed      Allergies as of 04/04/2016      Reactions   Ciprofloxacin Other (See Comments)   chest pain   Hydromorphone Itching   Oxycodone-acetaminophen    REACTION: itching   Pennsaid [diclofenac Sodium] Other (See  Comments)   "made me feel bad all over" Flu like symptoms/fever   Sulfamethoxazole-trimethoprim Rash      Medication List    STOP taking these medications   ibuprofen 200 MG tablet Commonly known as:  ADVIL,MOTRIN     TAKE these medications   aspirin EC 325 MG tablet Take 1 tablet (325 mg total) by mouth 2 (two) times daily.   co-enzyme Q-10 30 MG capsule Take 30 mg by mouth daily.   ferrous sulfate 325 (65 FE) MG tablet Take 325 mg by mouth daily as needed (for iron deficiency related to prevacid).   GLUCOSAMINE HCL-MSM PO Take 1,500 mg by mouth 2 (two) times daily.   HYDROcodone-acetaminophen 7.5-325 MG tablet Commonly known as:  NORCO Take 1-2 tablets by mouth every 4 (four) hours as needed (breakthrough pain).   lansoprazole 30 MG capsule Commonly known as:  PREVACID Take 30 mg by mouth daily at 2 PM.   levothyroxine 112 MCG tablet Commonly known as:  SYNTHROID, LEVOTHROID Take 112 mcg by mouth daily.   methocarbamol 500 MG tablet Commonly known as:  ROBAXIN Take 1 tablet (500 mg total) by mouth every 6 (six) hours as needed for muscle spasms.   MUCINEX DM PO Take 20 mLs by mouth every 6 (six) hours as needed (cough).   multivitamin with minerals Tabs tablet  Take 1 tablet by mouth daily.   Salmon Oil Caps Take 1 capsule by mouth daily.   SAW PALMETTO PO Take 250 mg by mouth 2 (two) times daily.   vitamin C 1000 MG tablet Take 1,000 mg by mouth daily.   vitamin E 400 UNIT capsule Take 400 Units by mouth daily.      Follow-up Information    [facility] Follow up.   Specialty:  Home Health Services Why:  home health physical therapist Contact information: [address] [place] [address] [phone]        [facility] Follow up.   Why:  rolling walker Contact information: [address] [place] [address] [phone]        Erasmo LeventhalOLLINS,ROBERT ANDREW, MD. Schedule an appointment as soon as  possible for a visit in 2 week(s).   Specialty:  Orthopedic Surgery Contact information: [address] [place] [address] [phone]           Signed: Markham JordanSTILWELL, BRYSON L 04/09/2016, 7:26 AM

## 2017-02-22 ENCOUNTER — Other Ambulatory Visit: Payer: Self-pay | Admitting: Dermatology

## 2017-08-30 DIAGNOSIS — H6992 Unspecified Eustachian tube disorder, left ear: Secondary | ICD-10-CM | POA: Insufficient documentation

## 2017-08-30 DIAGNOSIS — H903 Sensorineural hearing loss, bilateral: Secondary | ICD-10-CM | POA: Insufficient documentation

## 2018-03-27 DIAGNOSIS — H40003 Preglaucoma, unspecified, bilateral: Secondary | ICD-10-CM | POA: Diagnosis not present

## 2018-04-10 DIAGNOSIS — Z96651 Presence of right artificial knee joint: Secondary | ICD-10-CM | POA: Diagnosis not present

## 2018-04-10 DIAGNOSIS — Z471 Aftercare following joint replacement surgery: Secondary | ICD-10-CM | POA: Diagnosis not present

## 2018-08-30 DIAGNOSIS — Z0120 Encounter for dental examination and cleaning without abnormal findings: Secondary | ICD-10-CM | POA: Diagnosis not present

## 2018-09-25 DIAGNOSIS — H40023 Open angle with borderline findings, high risk, bilateral: Secondary | ICD-10-CM | POA: Diagnosis not present

## 2018-09-25 DIAGNOSIS — H2512 Age-related nuclear cataract, left eye: Secondary | ICD-10-CM | POA: Diagnosis not present

## 2018-10-17 DIAGNOSIS — N400 Enlarged prostate without lower urinary tract symptoms: Secondary | ICD-10-CM | POA: Diagnosis not present

## 2018-10-17 DIAGNOSIS — N5201 Erectile dysfunction due to arterial insufficiency: Secondary | ICD-10-CM | POA: Diagnosis not present

## 2018-10-31 DIAGNOSIS — Z125 Encounter for screening for malignant neoplasm of prostate: Secondary | ICD-10-CM | POA: Diagnosis not present

## 2018-10-31 DIAGNOSIS — E038 Other specified hypothyroidism: Secondary | ICD-10-CM | POA: Diagnosis not present

## 2018-10-31 DIAGNOSIS — Z0000 Encounter for general adult medical examination without abnormal findings: Secondary | ICD-10-CM | POA: Diagnosis not present

## 2018-12-13 DIAGNOSIS — R82998 Other abnormal findings in urine: Secondary | ICD-10-CM | POA: Diagnosis not present

## 2018-12-15 DIAGNOSIS — Z0000 Encounter for general adult medical examination without abnormal findings: Secondary | ICD-10-CM | POA: Diagnosis not present

## 2018-12-15 DIAGNOSIS — G43909 Migraine, unspecified, not intractable, without status migrainosus: Secondary | ICD-10-CM | POA: Diagnosis not present

## 2018-12-15 DIAGNOSIS — E039 Hypothyroidism, unspecified: Secondary | ICD-10-CM | POA: Diagnosis not present

## 2018-12-15 DIAGNOSIS — K219 Gastro-esophageal reflux disease without esophagitis: Secondary | ICD-10-CM | POA: Diagnosis not present

## 2019-03-06 DIAGNOSIS — Z0120 Encounter for dental examination and cleaning without abnormal findings: Secondary | ICD-10-CM | POA: Diagnosis not present

## 2019-03-26 DIAGNOSIS — H40023 Open angle with borderline findings, high risk, bilateral: Secondary | ICD-10-CM | POA: Diagnosis not present

## 2019-03-26 DIAGNOSIS — H2512 Age-related nuclear cataract, left eye: Secondary | ICD-10-CM | POA: Diagnosis not present

## 2019-09-11 DIAGNOSIS — Z0120 Encounter for dental examination and cleaning without abnormal findings: Secondary | ICD-10-CM | POA: Diagnosis not present

## 2019-09-26 DIAGNOSIS — H40023 Open angle with borderline findings, high risk, bilateral: Secondary | ICD-10-CM | POA: Diagnosis not present

## 2019-09-26 DIAGNOSIS — H2512 Age-related nuclear cataract, left eye: Secondary | ICD-10-CM | POA: Diagnosis not present

## 2019-09-26 DIAGNOSIS — Z961 Presence of intraocular lens: Secondary | ICD-10-CM | POA: Diagnosis not present

## 2019-09-26 DIAGNOSIS — H25012 Cortical age-related cataract, left eye: Secondary | ICD-10-CM | POA: Diagnosis not present

## 2019-10-16 DIAGNOSIS — N400 Enlarged prostate without lower urinary tract symptoms: Secondary | ICD-10-CM | POA: Diagnosis not present

## 2019-10-16 DIAGNOSIS — N5201 Erectile dysfunction due to arterial insufficiency: Secondary | ICD-10-CM | POA: Diagnosis not present

## 2019-12-26 DIAGNOSIS — E039 Hypothyroidism, unspecified: Secondary | ICD-10-CM | POA: Diagnosis not present

## 2019-12-26 DIAGNOSIS — Z125 Encounter for screening for malignant neoplasm of prostate: Secondary | ICD-10-CM | POA: Diagnosis not present

## 2020-01-02 DIAGNOSIS — G43909 Migraine, unspecified, not intractable, without status migrainosus: Secondary | ICD-10-CM | POA: Diagnosis not present

## 2020-01-02 DIAGNOSIS — K219 Gastro-esophageal reflux disease without esophagitis: Secondary | ICD-10-CM | POA: Diagnosis not present

## 2020-01-02 DIAGNOSIS — E039 Hypothyroidism, unspecified: Secondary | ICD-10-CM | POA: Diagnosis not present

## 2020-01-02 DIAGNOSIS — R82998 Other abnormal findings in urine: Secondary | ICD-10-CM | POA: Diagnosis not present

## 2020-01-02 DIAGNOSIS — Z0000 Encounter for general adult medical examination without abnormal findings: Secondary | ICD-10-CM | POA: Diagnosis not present

## 2020-02-19 DIAGNOSIS — Z1212 Encounter for screening for malignant neoplasm of rectum: Secondary | ICD-10-CM | POA: Diagnosis not present

## 2020-03-20 DIAGNOSIS — Z0120 Encounter for dental examination and cleaning without abnormal findings: Secondary | ICD-10-CM | POA: Diagnosis not present

## 2020-05-01 DIAGNOSIS — D485 Neoplasm of uncertain behavior of skin: Secondary | ICD-10-CM | POA: Diagnosis not present

## 2020-05-01 DIAGNOSIS — L570 Actinic keratosis: Secondary | ICD-10-CM | POA: Diagnosis not present

## 2020-05-01 DIAGNOSIS — D225 Melanocytic nevi of trunk: Secondary | ICD-10-CM | POA: Diagnosis not present

## 2020-05-01 DIAGNOSIS — C44319 Basal cell carcinoma of skin of other parts of face: Secondary | ICD-10-CM | POA: Diagnosis not present

## 2020-05-01 DIAGNOSIS — L814 Other melanin hyperpigmentation: Secondary | ICD-10-CM | POA: Diagnosis not present

## 2020-05-19 DIAGNOSIS — H2512 Age-related nuclear cataract, left eye: Secondary | ICD-10-CM | POA: Diagnosis not present

## 2020-05-19 DIAGNOSIS — Z961 Presence of intraocular lens: Secondary | ICD-10-CM | POA: Diagnosis not present

## 2020-05-19 DIAGNOSIS — H25012 Cortical age-related cataract, left eye: Secondary | ICD-10-CM | POA: Diagnosis not present

## 2020-05-19 DIAGNOSIS — H40023 Open angle with borderline findings, high risk, bilateral: Secondary | ICD-10-CM | POA: Diagnosis not present

## 2020-06-03 ENCOUNTER — Encounter: Payer: Self-pay | Admitting: Internal Medicine

## 2020-06-09 DIAGNOSIS — H2512 Age-related nuclear cataract, left eye: Secondary | ICD-10-CM | POA: Diagnosis not present

## 2020-06-09 DIAGNOSIS — Z961 Presence of intraocular lens: Secondary | ICD-10-CM | POA: Diagnosis not present

## 2020-06-09 DIAGNOSIS — H25012 Cortical age-related cataract, left eye: Secondary | ICD-10-CM | POA: Diagnosis not present

## 2020-06-09 DIAGNOSIS — H40023 Open angle with borderline findings, high risk, bilateral: Secondary | ICD-10-CM | POA: Diagnosis not present

## 2020-06-19 ENCOUNTER — Encounter: Payer: Self-pay | Admitting: Internal Medicine

## 2020-06-30 DIAGNOSIS — H2512 Age-related nuclear cataract, left eye: Secondary | ICD-10-CM | POA: Diagnosis not present

## 2020-06-30 DIAGNOSIS — Z961 Presence of intraocular lens: Secondary | ICD-10-CM | POA: Diagnosis not present

## 2020-06-30 DIAGNOSIS — H40023 Open angle with borderline findings, high risk, bilateral: Secondary | ICD-10-CM | POA: Diagnosis not present

## 2020-06-30 DIAGNOSIS — H25012 Cortical age-related cataract, left eye: Secondary | ICD-10-CM | POA: Diagnosis not present

## 2020-07-11 DIAGNOSIS — H353130 Nonexudative age-related macular degeneration, bilateral, stage unspecified: Secondary | ICD-10-CM | POA: Diagnosis not present

## 2020-07-11 DIAGNOSIS — H2512 Age-related nuclear cataract, left eye: Secondary | ICD-10-CM | POA: Diagnosis not present

## 2020-07-11 DIAGNOSIS — H401131 Primary open-angle glaucoma, bilateral, mild stage: Secondary | ICD-10-CM | POA: Diagnosis not present

## 2020-07-11 DIAGNOSIS — Z961 Presence of intraocular lens: Secondary | ICD-10-CM | POA: Diagnosis not present

## 2020-07-18 DIAGNOSIS — H2512 Age-related nuclear cataract, left eye: Secondary | ICD-10-CM | POA: Diagnosis not present

## 2020-07-18 DIAGNOSIS — H401131 Primary open-angle glaucoma, bilateral, mild stage: Secondary | ICD-10-CM | POA: Diagnosis not present

## 2020-07-18 DIAGNOSIS — H353130 Nonexudative age-related macular degeneration, bilateral, stage unspecified: Secondary | ICD-10-CM | POA: Diagnosis not present

## 2020-07-18 DIAGNOSIS — H401121 Primary open-angle glaucoma, left eye, mild stage: Secondary | ICD-10-CM | POA: Diagnosis not present

## 2020-07-25 DIAGNOSIS — H401131 Primary open-angle glaucoma, bilateral, mild stage: Secondary | ICD-10-CM | POA: Diagnosis not present

## 2020-08-11 ENCOUNTER — Encounter: Payer: Self-pay | Admitting: *Deleted

## 2020-08-28 ENCOUNTER — Ambulatory Visit (AMBULATORY_SURGERY_CENTER): Payer: Medicare Other | Admitting: *Deleted

## 2020-08-28 ENCOUNTER — Other Ambulatory Visit: Payer: Self-pay

## 2020-08-28 VITALS — Ht 68.0 in | Wt 200.0 lb

## 2020-08-28 DIAGNOSIS — Z86010 Personal history of colonic polyps: Secondary | ICD-10-CM

## 2020-08-28 MED ORDER — SUTAB 1479-225-188 MG PO TABS: 24.0000 | ORAL_TABLET | ORAL | 0 refills | Status: DC

## 2020-08-28 NOTE — Progress Notes (Signed)
No egg or soy allergy known to patient  No issues with past sedation with any surgeries or procedures- PT STATES WITH KNEE REPLACEMENT LOST SHORT TERM MEMORY AND HAS NOT RETURNED TO NORMAL  Patient denies ever being told they had issues or difficulty with intubation  No FH of Malignant Hyperthermia No diet pills per patient No home 02 use per patient  No blood thinners per patient  Pt denies issues with constipation  No A fib or A flutter  EMMI video to pt or via MyChart  COVID 19 guidelines implemented in PV today with Pt and RN  Pt is fully vaccinated  for Covid   SUTAB  Coupon given to pt in PV today , Code to Pharmacy and  NO PA's for preps discussed with pt In PV today  Discussed with pt there will be an out-of-pocket cost for prep and that varies from $0 to 70 dollars   Due to the COVID-19 pandemic we are asking patients to follow certain guidelines.  Pt aware of COVID protocols and [facility] guidelines

## 2020-09-11 ENCOUNTER — Other Ambulatory Visit: Payer: Self-pay

## 2020-09-11 ENCOUNTER — Encounter: Payer: Self-pay | Admitting: Internal Medicine

## 2020-09-11 ENCOUNTER — Ambulatory Visit (AMBULATORY_SURGERY_CENTER): Payer: Medicare Other | Admitting: Internal Medicine

## 2020-09-11 VITALS — BP 131/82 | HR 71 | Temp 97.1°F | Resp 13 | Ht 68.0 in | Wt 200.0 lb

## 2020-09-11 DIAGNOSIS — Z86010 Personal history of colonic polyps: Secondary | ICD-10-CM | POA: Diagnosis not present

## 2020-09-11 MED ORDER — SODIUM CHLORIDE 0.9 % IV SOLN: 500.0000 mL | Freq: Once | INTRAVENOUS | Status: DC

## 2020-09-11 NOTE — Patient Instructions (Signed)
Please read handouts provided. ?Continue present medications. ?Repeat colonoscopy in 5 years for screening. ? ? ?YOU HAD AN ENDOSCOPIC PROCEDURE TODAY AT THE [facility]:   Refer to the procedure report that was given to you for any specific questions about what was found during the examination.  If the procedure report does not answer your questions, please call your gastroenterologist to clarify.  If you requested that your care partner not be given the details of your procedure findings, then the procedure report has been included in a sealed envelope for you to review at your convenience later. ? ?YOU SHOULD EXPECT: Some feelings of bloating in the abdomen. Passage of more gas than usual.  Walking can help get rid of the air that was put into your GI tract during the procedure and reduce the bloating. If you had a lower endoscopy (such as a colonoscopy or flexible sigmoidoscopy) you may notice spotting of blood in your stool or on the toilet paper. If you underwent a bowel prep for your procedure, you may not have a normal bowel movement for a few days. ? ?Please Note:  You might notice some irritation and congestion in your nose or some drainage.  This is from the oxygen used during your procedure.  There is no need for concern and it should clear up in a day or so. ? ?SYMPTOMS TO REPORT IMMEDIATELY: ? ?Following lower endoscopy (colonoscopy or flexible sigmoidoscopy): ? Excessive amounts of blood in the stool ? Significant tenderness or worsening of abdominal pains ? Swelling of the abdomen that is new, acute ? Fever of 100?F or higher ? ? ?For urgent or emergent issues, a gastroenterologist can be reached at any hour by calling [phone]. ?Do not use MyChart messaging for urgent concerns.  ? ? ?DIET:  We do recommend a small meal at first, but then you may proceed to your regular diet.  Drink plenty of fluids but you should avoid alcoholic beverages for 24 hours. ? ?ACTIVITY:  You should  plan to take it easy for the rest of today and you should NOT DRIVE or use heavy machinery until tomorrow (because of the sedation medicines used during the test).   ? ?FOLLOW UP: ?Our staff will call the number listed on your records 48-72 hours following your procedure to check on you and address any questions or concerns that you may have regarding the information given to you following your procedure. If we do not reach you, we will leave a message.  We will attempt to reach you two times.  During this call, we will ask if you have developed any symptoms of COVID 19. If you develop any symptoms (ie: fever, flu-like symptoms, shortness of breath, cough etc.) before then, please call [phone].  If you test positive for Covid 19 in the 2 weeks post procedure, please call and report this information to us.   ? ?If any biopsies were taken you will be contacted by phone or by letter within the next 1-3 weeks.  Please call us at [phone] if you have not heard about the biopsies in 3 weeks.  ? ? ?SIGNATURES/CONFIDENTIALITY: ?You and/or your care partner have signed paperwork which will be entered into your electronic medical record.  These signatures attest to the fact that that the information above on your After Visit Summary has been reviewed and is understood.  Full responsibility of the confidentiality of this discharge information lies with you and/or your care-partner.  ?

## 2020-09-11 NOTE — Op Note (Signed)
[facility] Patient Name: Eric Case Procedure Date: 09/11/2020 8:02 AM MRN: [id number] Endoscopist: Wilhemina Bonito. Marina Goodell , MD Age: 75 Referring MD:  Date of Birth: Dec 29, 1945 Gender: Male Account #: [id number] Procedure:                Colonoscopy Indications:              High risk colon cancer surveillance: Personal                            history of multiple (3 or more) adenomas. prior                            2008, 2011 Medicines:                Monitored Anesthesia Care Procedure:                Pre-Anesthesia Assessment:                           - Prior to the procedure, a History and Physical                            was performed, and patient medications and                            allergies were reviewed. The patient's tolerance of                            previous anesthesia was also reviewed. The risks                            and benefits of the procedure and the sedation                            options and risks were discussed with the patient.                            All questions were answered, and informed consent                            was obtained. Prior Anticoagulants: The patient has                            taken no previous anticoagulant or antiplatelet                            agents. ASA Grade Assessment: II - A patient with                            mild systemic disease. After reviewing the risks                            and benefits, the patient was deemed in  satisfactory condition to undergo the procedure.                           After obtaining informed consent, the colonoscope                            was passed under direct vision. Throughout the                            procedure, the patient's blood pressure, pulse, and                            oxygen saturations were monitored continuously. The                            Olympus CF-HQ190L (Serial# [id number]) Colonoscope was                             introduced through the anus and advanced to the the                            cecum, identified by appendiceal orifice and                            ileocecal valve. The ileocecal valve, appendiceal                            orifice, and rectum were photographed. The quality                            of the bowel preparation was excellent. The                            colonoscopy was performed without difficulty. The                            patient tolerated the procedure well. The bowel                            preparation used was SUPREP via split dose                            instruction. Scope In: 8:27:59 AM Scope Out: 8:38:02 AM Scope Withdrawal Time: 0 hours 7 minutes 58 seconds  Total Procedure Duration: 0 hours 10 minutes 3 seconds  Findings:                 Many small and large-mouthed diverticula were found                            in the transverse colon and left colon.                           The exam was otherwise without abnormality on  direct and retroflexion views. Complications:            No immediate complications. Estimated blood loss:                            None. Estimated Blood Loss:     Estimated blood loss: none. Impression:               - Diverticulosis in the transverse colon and in the                            left colon.                           - The examination was otherwise normal on direct                            and retroflexion views.                           - No specimens collected. Recommendation:           - Repeat colonoscopy in 5 years for surveillance                            (hx multiple polyps).                           - Patient has a contact number available for                            emergencies. The signs and symptoms of potential                            delayed complications were discussed with the                            patient. Return to normal  activities tomorrow.                            Written discharge instructions were provided to the                            patient.                           - Resume previous diet.                           - Continue present medications. Wilhemina Bonito. Marina Goodell, MD 09/11/2020 8:52:37 AM This report has been signed electronically.

## 2020-09-11 NOTE — Progress Notes (Signed)
PT taken to PACU. Monitors in place. VSS. Report given to RN. 

## 2020-09-11 NOTE — Progress Notes (Signed)
Medical history reviewed with no changes noted since tele visit. VS assessed by C.W 

## 2020-09-15 ENCOUNTER — Telehealth: Payer: Self-pay

## 2020-09-15 NOTE — Telephone Encounter (Signed)
  Follow up Call-  Call back number 09/11/2020  Post procedure Call Back phone  # [phone]  Permission to leave phone message Yes  Some recent data might be hidden     Patient questions:  Do you have a fever, pain , or abdominal swelling? No. Pain Score  0 *  Have you tolerated food without any problems? Yes.    Have you been able to return to your normal activities? Yes.    Do you have any questions about your discharge instructions: Diet   No. Medications  No. Follow up visit  No.  Do you have questions or concerns about your Care? No.  Actions: * If pain score is 4 or above: No action needed, pain <4.  Have you developed a fever since your procedure? no  2.   Have you had an respiratory symptoms (SOB or cough) since your procedure? no  3.   Have you tested positive for COVID 19 since your procedure no  4.   Have you had any family members/close contacts diagnosed with the COVID 19 since your procedure?  no   If yes to any of these questions please route to Laverna Peace, RN and Karlton Lemon, RN

## 2020-10-01 DIAGNOSIS — R3915 Urgency of urination: Secondary | ICD-10-CM | POA: Diagnosis not present

## 2020-10-01 DIAGNOSIS — N5201 Erectile dysfunction due to arterial insufficiency: Secondary | ICD-10-CM | POA: Diagnosis not present

## 2020-10-01 DIAGNOSIS — N401 Enlarged prostate with lower urinary tract symptoms: Secondary | ICD-10-CM | POA: Diagnosis not present

## 2020-11-21 DIAGNOSIS — D1801 Hemangioma of skin and subcutaneous tissue: Secondary | ICD-10-CM | POA: Diagnosis not present

## 2020-11-21 DIAGNOSIS — D2261 Melanocytic nevi of right upper limb, including shoulder: Secondary | ICD-10-CM | POA: Diagnosis not present

## 2020-11-21 DIAGNOSIS — L570 Actinic keratosis: Secondary | ICD-10-CM | POA: Diagnosis not present

## 2020-11-21 DIAGNOSIS — L821 Other seborrheic keratosis: Secondary | ICD-10-CM | POA: Diagnosis not present

## 2020-11-21 DIAGNOSIS — Z85828 Personal history of other malignant neoplasm of skin: Secondary | ICD-10-CM | POA: Diagnosis not present

## 2021-01-27 DIAGNOSIS — I10 Essential (primary) hypertension: Secondary | ICD-10-CM | POA: Diagnosis not present

## 2021-01-27 DIAGNOSIS — Z125 Encounter for screening for malignant neoplasm of prostate: Secondary | ICD-10-CM | POA: Diagnosis not present

## 2021-01-27 DIAGNOSIS — E039 Hypothyroidism, unspecified: Secondary | ICD-10-CM | POA: Diagnosis not present

## 2021-02-03 DIAGNOSIS — I10 Essential (primary) hypertension: Secondary | ICD-10-CM | POA: Diagnosis not present

## 2021-02-16 DIAGNOSIS — R509 Fever, unspecified: Secondary | ICD-10-CM | POA: Diagnosis not present

## 2021-02-16 DIAGNOSIS — R051 Acute cough: Secondary | ICD-10-CM | POA: Diagnosis not present

## 2021-02-16 DIAGNOSIS — Z20828 Contact with and (suspected) exposure to other viral communicable diseases: Secondary | ICD-10-CM | POA: Diagnosis not present

## 2021-02-16 DIAGNOSIS — R0981 Nasal congestion: Secondary | ICD-10-CM | POA: Diagnosis not present

## 2021-02-17 ENCOUNTER — Encounter (HOSPITAL_COMMUNITY): Payer: Self-pay | Admitting: Radiology

## 2021-02-17 ENCOUNTER — Emergency Department (HOSPITAL_COMMUNITY): Payer: Medicare Other

## 2021-02-17 ENCOUNTER — Emergency Department (HOSPITAL_COMMUNITY)
Admission: EM | Admit: 2021-02-17 | Discharge: 2021-02-17 | Disposition: A | Discharge status: Home / Self Care | Payer: Medicare Other | Attending: Emergency Medicine | Admitting: Emergency Medicine

## 2021-02-17 DIAGNOSIS — R519 Headache, unspecified: Secondary | ICD-10-CM | POA: Diagnosis not present

## 2021-02-17 DIAGNOSIS — S0990XA Unspecified injury of head, initial encounter: Secondary | ICD-10-CM | POA: Diagnosis present

## 2021-02-17 DIAGNOSIS — Z96651 Presence of right artificial knee joint: Secondary | ICD-10-CM | POA: Diagnosis not present

## 2021-02-17 DIAGNOSIS — S0081XA Abrasion of other part of head, initial encounter: Secondary | ICD-10-CM | POA: Insufficient documentation

## 2021-02-17 DIAGNOSIS — R9082 White matter disease, unspecified: Secondary | ICD-10-CM | POA: Diagnosis not present

## 2021-02-17 DIAGNOSIS — R55 Syncope and collapse: Secondary | ICD-10-CM | POA: Diagnosis not present

## 2021-02-17 DIAGNOSIS — R42 Dizziness and giddiness: Secondary | ICD-10-CM | POA: Diagnosis not present

## 2021-02-17 DIAGNOSIS — Y92194 Driveway of other specified residential institution as the place of occurrence of the external cause: Secondary | ICD-10-CM | POA: Diagnosis not present

## 2021-02-17 DIAGNOSIS — R059 Cough, unspecified: Secondary | ICD-10-CM | POA: Diagnosis not present

## 2021-02-17 DIAGNOSIS — R531 Weakness: Secondary | ICD-10-CM | POA: Diagnosis not present

## 2021-02-17 DIAGNOSIS — Z79899 Other long term (current) drug therapy: Secondary | ICD-10-CM | POA: Diagnosis not present

## 2021-02-17 DIAGNOSIS — Y9301 Activity, walking, marching and hiking: Secondary | ICD-10-CM | POA: Diagnosis not present

## 2021-02-17 DIAGNOSIS — R0781 Pleurodynia: Secondary | ICD-10-CM | POA: Insufficient documentation

## 2021-02-17 DIAGNOSIS — U071 COVID-19: Secondary | ICD-10-CM | POA: Insufficient documentation

## 2021-02-17 DIAGNOSIS — E039 Hypothyroidism, unspecified: Secondary | ICD-10-CM | POA: Insufficient documentation

## 2021-02-17 DIAGNOSIS — W01198A Fall on same level from slipping, tripping and stumbling with subsequent striking against other object, initial encounter: Secondary | ICD-10-CM | POA: Diagnosis not present

## 2021-02-17 DIAGNOSIS — K449 Diaphragmatic hernia without obstruction or gangrene: Secondary | ICD-10-CM | POA: Diagnosis not present

## 2021-02-17 DIAGNOSIS — M542 Cervicalgia: Secondary | ICD-10-CM | POA: Diagnosis not present

## 2021-02-17 DIAGNOSIS — W19XXXA Unspecified fall, initial encounter: Secondary | ICD-10-CM

## 2021-02-17 LAB — CBC WITH DIFFERENTIAL/PLATELET
Abs Immature Granulocytes: 0.05 10*3/uL (ref 0.00–0.07)
Basophils Absolute: 0.1 10*3/uL (ref 0.0–0.1)
Basophils Relative: 1 %
Eosinophils Absolute: 0.1 10*3/uL (ref 0.0–0.5)
Eosinophils Relative: 1 %
HCT: 46.2 % (ref 39.0–52.0)
Hemoglobin: 14.9 g/dL (ref 13.0–17.0)
Immature Granulocytes: 0 %
Lymphocytes Relative: 6 %
Lymphs Abs: 0.7 10*3/uL (ref 0.7–4.0)
MCH: 27.4 pg (ref 26.0–34.0)
MCHC: 32.3 g/dL (ref 30.0–36.0)
MCV: 85.1 fL (ref 80.0–100.0)
Monocytes Absolute: 1.3 10*3/uL — ABNORMAL HIGH (ref 0.1–1.0)
Monocytes Relative: 11 %
Neutro Abs: 9.4 10*3/uL — ABNORMAL HIGH (ref 1.7–7.7)
Neutrophils Relative %: 81 %
Platelets: 276 10*3/uL (ref 150–400)
RBC: 5.43 MIL/uL (ref 4.22–5.81)
RDW: 15.8 % — ABNORMAL HIGH (ref 11.5–15.5)
WBC: 11.7 10*3/uL — ABNORMAL HIGH (ref 4.0–10.5)
nRBC: 0 % (ref 0.0–0.2)

## 2021-02-17 LAB — COMPREHENSIVE METABOLIC PANEL
ALT: 24 U/L (ref 0–44)
AST: 28 U/L (ref 15–41)
Albumin: 4.3 g/dL (ref 3.5–5.0)
Alkaline Phosphatase: 57 U/L (ref 38–126)
Anion gap: 7 (ref 5–15)
BUN: 19 mg/dL (ref 8–23)
CO2: 24 mmol/L (ref 22–32)
Calcium: 8.7 mg/dL — ABNORMAL LOW (ref 8.9–10.3)
Chloride: 103 mmol/L (ref 98–111)
Creatinine, Ser: 0.84 mg/dL (ref 0.61–1.24)
GFR, Estimated: >60 mL/min (ref >60)
Glucose, Bld: 119 mg/dL — ABNORMAL HIGH (ref 70–99)
Potassium: 3.9 mmol/L (ref 3.5–5.1)
Sodium: 134 mmol/L — ABNORMAL LOW (ref 135–145)
Total Bilirubin: 0.6 mg/dL (ref 0.3–1.2)
Total Protein: 7.4 g/dL (ref 6.5–8.1)

## 2021-02-17 NOTE — Discharge Instructions (Addendum)
You were evaluated in the Emergency Department and after careful evaluation, we did not find any emergent condition requiring admission or further testing in the hospital.  You may take Tylenol/ibuprofen for pain.  Please follow-up with your primary care doctor about your ongoing issues with balance. Make sure to increase the amount of water you have been drinking. Continue to quarantine for 7-10 days.  Please return to the Emergency Department if you experience any worsening of your condition.  Thank you for allowing [place] to be a part of your care.

## 2021-02-17 NOTE — ED Triage Notes (Signed)
Per pt, states mechanical fall going down driveway this am-abrasion to right side of face-states he has been having trouble with his right achilias tendon-wife states he tested positive for covid yesterday-wife states they are not sure why he has been having mobility issues-states she thought he was going to   'pass out' yesterday-no history of stroke, no cardiac issues

## 2021-02-17 NOTE — ED Provider Notes (Signed)
I provided a substantive portion of the care of this patient.  I personally performed the entirety of the medical decision making for this encounter.   75 year old male presented mechanical fall just prior to arrival.  Any chest pain or dyspnea.  Labs and x-rays are reassuring.  Will discharge home      Lorre Nick, MD 02/17/21 1730

## 2021-02-17 NOTE — ED Provider Notes (Signed)
[facility]-EMERGENCY DEPT Provider Note   CSN: [id number] Arrival date & time: 02/17/21  0900     History Chief Complaint  Patient presents with   Eric Case is a 75 y.o. male.  HPI 75 year old male with a history of anemia, diverticulitis, GERD, hemorrhoids, hypothyroidism presents to the ER after a fall.  History provided by patient and wife at bedside.  Patient states that he was walking down a gravel driveway when he thinks his knee gave out.  He fell onto the right side of his face.  He denies any loss of consciousness, headache, chest pain, shortness of breath, dizziness at the time of the fall.  He has been having ongoing issues with balance per his spouse at bedside though he has not been worked up for this.  He also states that he injured his right Achilles tendon about 2 months ago while hiking and has had issues since then.  He was also diagnosed with COVID 2 days ago, was seen at urgent care and given codeine for cough medicine.  Wife also states that he had a near syncopal episode yesterday while sitting in the chair.  He was also prescribed steroids though he has not taken these.  He denies any history of COPD, shortness of breath.  Overall feeling well despite his COVID diagnosis.  At present, he denies any headache, nausea, vomiting, shortness of breath, chest pain, back pain.  He complains of some right-sided rib cage tenderness.   Past Medical History:  Diagnosis Date   Anemia    on Iron- comes and goes per patient    Arthritis    Cataract    starting but very small- RIGHT REMOVED   Complication of anesthesia    severe headache after esophagus stretched years ago   Diverticulitis    Diverticulosis    GERD (gastroesophageal reflux disease)    Hemorrhoids    Hiatal hernia    Hx of adenomatous colonic polyps    Hypothyroidism    Peptic stricture of esophagus     Patient Active Problem List   Diagnosis Date Noted   S/P knee  replacement 04/02/2016   Primary osteoarthritis of one knee, right 04/02/2016   ESOPHAGEAL STRICTURE 12/31/2009   GERD 12/31/2009   DIVERTICULITIS OF COLON 12/31/2009   PERSONAL HISTORY OF COLONIC POLYPS 12/31/2009    Past Surgical History:  Procedure Laterality Date   CATARACT EXTRACTION Right    COLONOSCOPY     KNEE SURGERY Right    tendon tear    MENISCUS REPAIR Right    2015 and May 2017   POLYPECTOMY     SHOULDER SURGERY     bilateral   TOTAL KNEE ARTHROPLASTY Right 04/02/2016   Procedure: RIGHT TOTAL KNEE ARTHROPLASTY;  Surgeon: Eugenia Mcalpine, MD;  Location: [facility];  Service: Orthopedics;  Laterality: Right;  Adductor Block   UPPER GASTROINTESTINAL ENDOSCOPY         Family History  Problem Relation Age of Onset   Breast cancer Mother    Heart disease Mother    Colon cancer Cousin    Liver cancer Father    Prostate cancer Father    Prostate cancer Maternal Uncle    Prostate cancer Maternal Uncle    Prostate cancer Maternal Uncle    Colon polyps Neg Hx    Esophageal cancer Neg Hx    Rectal cancer Neg Hx    Stomach cancer Neg Hx     Social  History   Tobacco Use   Smoking status: Never   Smokeless tobacco: Never  Substance Use Topics   Alcohol use: No    Alcohol/week: 0.0 standard drinks   Drug use: No    Home Medications Prior to Admission medications   Medication Sig Start Date End Date Taking? Authorizing Provider  Ascorbic Acid (VITAMIN C) 1000 MG tablet Take 1,000 mg by mouth daily.      [provider]  co-enzyme Q-10 30 MG capsule Take 30 mg by mouth daily.     [provider]  ferrous sulfate 325 (65 FE) MG tablet Take 325 mg by mouth daily as needed (for iron deficiency related to prevacid).  Patient not taking: Reported on 09/11/2020    [provider]  lansoprazole (PREVACID) 15 MG capsule Take 15 mg by mouth daily. 07/07/20   [provider]  levothyroxine (SYNTHROID, LEVOTHROID) 112 MCG tablet Take 112 mcg  by mouth daily.      [provider]  Multiple Vitamin (MULTIVITAMIN WITH MINERALS) TABS tablet Take 1 tablet by mouth daily.    [provider]  Nutritional Supplements (SALMON OIL) CAPS Take 1 capsule by mouth daily.    [provider]  Saw Palmetto, Serenoa repens, (SAW PALMETTO PO) Take 250 mg by mouth 2 (two) times daily.     [provider]  vitamin E 400 UNIT capsule Take 400 Units by mouth daily.      [provider]    Allergies    Ciprofloxacin, Doxycycline, Hydromorphone, Oxycodone-acetaminophen, Pennsaid [diclofenac sodium], and Sulfamethoxazole-trimethoprim  Review of Systems   Review of Systems  Physical Exam Updated Vital Signs BP (!) 155/88   Pulse 84   Temp 98.1 F (36.7 C) (Oral)   Resp 16   SpO2 97%   Physical Exam Vitals and nursing note reviewed.  Constitutional:      General: He is not in acute distress.    Appearance: He is well-developed. He is not ill-appearing or diaphoretic.  HENT:     Head: Normocephalic and atraumatic.     Comments: No evidence of hemotympanum, raccoon eyes, battle sign.  No mastoid tenderness.  No malocclusion.  No cranial deformities. Full range of motion of head and neck   Superficial abrasion to the right temple with evidence of dried blood Eyes:     Conjunctiva/sclera: Conjunctivae normal.  Cardiovascular:     Rate and Rhythm: Normal rate and regular rhythm.     Heart sounds: No murmur heard.    Comments: Minimal right sided chest wall tenderness, no crepitus, deformities, flail chest  Pulmonary:     Effort: Pulmonary effort is normal. No respiratory distress.     Breath sounds: Normal breath sounds.  Abdominal:     Palpations: Abdomen is soft.     Tenderness: There is no abdominal tenderness.  Musculoskeletal:        General: No swelling or tenderness. Normal range of motion.     Cervical back: Neck supple.     Comments: No C, T, L-spine tenderness.  5/5 strength in upper  and lower extremities.  No noticeable step-offs, crepitus, fluctuance, erythema.  Sensations intact.  Full range of motion and strength of neck. Moving all 4 extremities without difficulty.     Skin:    General: Skin is warm and dry.     Capillary Refill: Capillary refill takes less than 2 seconds.  Neurological:     General: No focal deficit present.  Mental Status: He is alert and oriented to person, place, and time.     Sensory: No sensory deficit.     Motor: No weakness.  Psychiatric:        Mood and Affect: Mood normal.    ED Results / Procedures / Treatments   Labs (all labs ordered are listed, but only abnormal results are displayed) Labs Reviewed  CBC WITH DIFFERENTIAL/PLATELET - Abnormal; Notable for the following components:      Result Value   WBC 11.7 (*)    RDW 15.8 (*)    Neutro Abs 9.4 (*)    Monocytes Absolute 1.3 (*)    All other components within normal limits  COMPREHENSIVE METABOLIC PANEL - Abnormal; Notable for the following components:   Sodium 134 (*)    Glucose, Bld 119 (*)    Calcium 8.7 (*)    All other components within normal limits    EKG None  Radiology DG Chest 2 View  Result Date: 02/17/2021 CLINICAL DATA:  75 year old who fell earlier today in his driveway at home. Cough. Generalized weakness. Recent COVID-19. EXAM: CHEST - 2 VIEW COMPARISON:  Portable chest x-ray and CTA chest 04/05/2016. FINDINGS: Cardiac silhouette upper normal in size, unchanged. Small hiatal hernia, unchanged. Hilar and mediastinal contours otherwise unremarkable. Lungs clear. Bronchovascular markings normal. Pulmonary vascularity normal. No visible pleural effusions. No pneumothorax. Degenerative changes involving the lower thoracic and upper lumbar spine. IMPRESSION: 1.  No acute cardiopulmonary disease. 2. Stable small hiatal hernia. Electronically Signed   By: Hulan Saas M.D.   On: 02/17/2021 10:03   CT HEAD WO CONTRAST ( )  Result Date:  02/17/2021 CLINICAL DATA:  Left-sided neck pain and right facial gray shin after falling today. EXAM: CT HEAD WITHOUT CONTRAST CT CERVICAL SPINE WITHOUT CONTRAST TECHNIQUE: Multidetector CT imaging of the head and cervical spine was performed following the standard protocol without intravenous contrast. Multiplanar CT image reconstructions of the cervical spine were also generated. COMPARISON:  None. FINDINGS: CT HEAD FINDINGS Brain: There is no evidence for acute hemorrhage, hydrocephalus, mass lesion, or abnormal extra-axial fluid collection. No definite CT evidence for acute infarction. Diffuse loss of parenchymal volume is consistent with atrophy. Patchy low attenuation in the deep hemispheric and periventricular white matter is nonspecific, but likely reflects chronic microvascular ischemic demyelination. Vascular: No hyperdense vessel or unexpected calcification. Skull: No evidence for fracture. No worrisome lytic or sclerotic lesion. Sinuses/Orbits: Chronic mucosal thickening noted ethmoid air cells. No mastoid effusion. Visualized portions of the globes and intraorbital fat are unremarkable. Other: None. CT CERVICAL SPINE FINDINGS Alignment: Straightening of normal cervical lordosis evident. Trace anterolisthesis of C4 on 5 is compatible with the degree of facet disease at the same level. Skull base and vertebrae: No acute fracture. No primary bone lesion or focal pathologic process. Small sclerotic lesion in the C6 vertebral body is likely a bone island. Soft tissues and spinal canal: No prevertebral fluid or swelling. No visible canal hematoma. Disc levels: Loss of disc height noted C5-6 and C6-7. Scattered facet osteoarthritis noted bilaterally. Upper chest: Unremarkable. Other: None. IMPRESSION: 1. No acute intracranial abnormality. Atrophy with chronic small vessel white matter ischemic disease. 2. Degenerative changes in the cervical spine without evidence for an acute fracture. 3. Loss of cervical  lordosis. This can be related to patient positioning, muscle spasm or soft tissue injury. Electronically Signed   By: Kennith Center M.D.   On: 02/17/2021 10:35   CT Cervical Spine Wo Contrast  Result Date: 02/17/2021  CLINICAL DATA:  Left-sided neck pain and right facial gray shin after falling today. EXAM: CT HEAD WITHOUT CONTRAST CT CERVICAL SPINE WITHOUT CONTRAST TECHNIQUE: Multidetector CT imaging of the head and cervical spine was performed following the standard protocol without intravenous contrast. Multiplanar CT image reconstructions of the cervical spine were also generated. COMPARISON:  None. FINDINGS: CT HEAD FINDINGS Brain: There is no evidence for acute hemorrhage, hydrocephalus, mass lesion, or abnormal extra-axial fluid collection. No definite CT evidence for acute infarction. Diffuse loss of parenchymal volume is consistent with atrophy. Patchy low attenuation in the deep hemispheric and periventricular white matter is nonspecific, but likely reflects chronic microvascular ischemic demyelination. Vascular: No hyperdense vessel or unexpected calcification. Skull: No evidence for fracture. No worrisome lytic or sclerotic lesion. Sinuses/Orbits: Chronic mucosal thickening noted ethmoid air cells. No mastoid effusion. Visualized portions of the globes and intraorbital fat are unremarkable. Other: None. CT CERVICAL SPINE FINDINGS Alignment: Straightening of normal cervical lordosis evident. Trace anterolisthesis of C4 on 5 is compatible with the degree of facet disease at the same level. Skull base and vertebrae: No acute fracture. No primary bone lesion or focal pathologic process. Small sclerotic lesion in the C6 vertebral body is likely a bone island. Soft tissues and spinal canal: No prevertebral fluid or swelling. No visible canal hematoma. Disc levels: Loss of disc height noted C5-6 and C6-7. Scattered facet osteoarthritis noted bilaterally. Upper chest: Unremarkable. Other: None. IMPRESSION:  1. No acute intracranial abnormality. Atrophy with chronic small vessel white matter ischemic disease. 2. Degenerative changes in the cervical spine without evidence for an acute fracture. 3. Loss of cervical lordosis. This can be related to patient positioning, muscle spasm or soft tissue injury. Electronically Signed   By: Kennith Center M.D.   On: 02/17/2021 10:35    Procedures Procedures   Medications Ordered in ED Medications - No data to display  ED Course  I have reviewed the triage vital signs and the nursing notes.  Pertinent labs & imaging results that were available during my care of the patient were reviewed by me and considered in my medical decision making (see chart for details).    MDM Rules/Calculators/A&P                           75 year old male with mechanical fall.  On arrival, he is well-appearing, alert and oriented x3, no acute distress, resting comfortably in the ED bed.  He has a visible superficial abrasion to the right side of his face with evidence of dried blood.  No evidence of raccoon eyes, hemotympanum, battle sign.  No midline cervical, thoracic, lumbar tenderness.  No indication for suturing. He has some tenderness to the right side of the chest wall, however no evidence of deformity, flail chest, crepitus.  Vital signs are overall very reassuring.  He is in no respiratory distress.  Able to answer all questions appropriately, following 2 steps commands without difficulty.  Wife endorses a near syncopal episode yesterday, no chest pain, shortness of breath, low suspicion for PE, dissection.  Could be secondary to  hypovolemia in the setting of acute COVID infection.  Patient is not requiring any supplemental oxygen and is in no respiratory distress.  Fall today likely mechanical.  CT of the head, cervical spine overall reassuring.  Labs with a mild leukocytosis of 11.7, likely reactive in the setting of acute COVID-19 infection.  CMP with mild hyponatremia 134, no  other significant abnormalities noted.  Chest x-ray without acute abnormalities.  Recommended outpatient follow-up for ongoing balance issues, no evidence of intracranial normality on CT.  Encouraged increasing p.o. intake.  Encouraged Tylenol/ibuprofen for pain. Return precautions discussed.  He and his wife at bedside voiced understanding and are agreeable.  Stable for discharge.  This was a shared visit with my supervising physician Dr. Freida Busman who independently saw and evaluated the patient & provided guidance in evaluation/management/disposition ,in agreement with care  Final Clinical Impression(s) / ED Diagnoses Final diagnoses:  Fall, initial encounter  Abrasion of face, initial encounter    Rx / DC Orders ED Discharge Orders     None        Leone Brand 02/17/21 1757    Lorre Nick, MD 02/19/21 1537

## 2021-02-17 NOTE — ED Provider Notes (Signed)
Emergency Medicine Provider Triage Evaluation Note  Eric Case , a 75 y.o. male  was evaluated in triage.  Pt complains of fall 1 hour ago. No LOC. Uncertain why he fell. Was walking down gravel driveway does have achilles tendon injury.  No NV or confusion currently. Yesterday morning he had a syncopal episode without any trauma.   Dx w covid yesterday.   Review of Systems  Positive: Fall, head injury Negative: Fever  Physical Exam  BP (!) 141/82 (BP Location: Left Arm)   Pulse 95   Temp 98.1 F (36.7 C) (Oral)   Resp 18   SpO2 98%  Gen:   Awake, no distress   Resp:  Normal effort  MSK:   Moves extremities without difficulty  Other:    Medical Decision Making  Medically screening exam initiated at 9:47 AM.  Appropriate orders placed.  CARMINO OCAIN was informed that the remainder of the evaluation will be completed by another provider, this initial triage assessment does not replace that evaluation, and the importance of remaining in the ED until their evaluation is complete.  Fall today head trauma.    Solon Augusta Blackwell, [place] 02/17/21 0947    Linwood Dibbles, MD 02/17/21 1538

## 2021-03-02 DIAGNOSIS — M67961 Unspecified disorder of synovium and tendon, right lower leg: Secondary | ICD-10-CM | POA: Diagnosis not present

## 2021-03-02 DIAGNOSIS — R269 Unspecified abnormalities of gait and mobility: Secondary | ICD-10-CM | POA: Diagnosis not present

## 2021-03-02 DIAGNOSIS — W010XXA Fall on same level from slipping, tripping and stumbling without subsequent striking against object, initial encounter: Secondary | ICD-10-CM | POA: Diagnosis not present

## 2021-03-02 DIAGNOSIS — D72829 Elevated white blood cell count, unspecified: Secondary | ICD-10-CM | POA: Diagnosis not present

## 2021-03-02 DIAGNOSIS — R350 Frequency of micturition: Secondary | ICD-10-CM | POA: Diagnosis not present

## 2021-03-02 DIAGNOSIS — M25571 Pain in right ankle and joints of right foot: Secondary | ICD-10-CM | POA: Diagnosis not present

## 2021-03-03 ENCOUNTER — Other Ambulatory Visit (HOSPITAL_COMMUNITY): Payer: Self-pay | Admitting: Student

## 2021-03-03 ENCOUNTER — Other Ambulatory Visit: Payer: Self-pay | Admitting: Student

## 2021-03-03 ENCOUNTER — Other Ambulatory Visit: Payer: Self-pay

## 2021-03-03 ENCOUNTER — Ambulatory Visit (HOSPITAL_COMMUNITY)
Admission: RE | Admit: 2021-03-03 | Discharge: 2021-03-03 | Disposition: A | Discharge status: Home / Self Care | Payer: Medicare Other | Source: Ambulatory Visit | Attending: Student | Admitting: Student

## 2021-03-03 DIAGNOSIS — S86011A Strain of right Achilles tendon, initial encounter: Secondary | ICD-10-CM | POA: Diagnosis not present

## 2021-03-03 DIAGNOSIS — M25571 Pain in right ankle and joints of right foot: Secondary | ICD-10-CM | POA: Insufficient documentation

## 2021-03-03 DIAGNOSIS — S99911A Unspecified injury of right ankle, initial encounter: Secondary | ICD-10-CM | POA: Diagnosis not present

## 2021-03-03 DIAGNOSIS — M7989 Other specified soft tissue disorders: Secondary | ICD-10-CM | POA: Diagnosis not present

## 2021-03-03 DIAGNOSIS — M19071 Primary osteoarthritis, right ankle and foot: Secondary | ICD-10-CM | POA: Diagnosis not present

## 2021-03-09 DIAGNOSIS — I10 Essential (primary) hypertension: Secondary | ICD-10-CM | POA: Diagnosis not present

## 2021-03-11 DIAGNOSIS — S86091D Other specified injury of right Achilles tendon, subsequent encounter: Secondary | ICD-10-CM | POA: Diagnosis not present

## 2021-03-13 ENCOUNTER — Encounter (HOSPITAL_BASED_OUTPATIENT_CLINIC_OR_DEPARTMENT_OTHER): Payer: Self-pay | Admitting: Orthopedic Surgery

## 2021-03-13 ENCOUNTER — Other Ambulatory Visit: Payer: Self-pay

## 2021-03-13 ENCOUNTER — Other Ambulatory Visit (HOSPITAL_COMMUNITY): Payer: Self-pay | Admitting: Orthopedic Surgery

## 2021-03-13 DIAGNOSIS — R269 Unspecified abnormalities of gait and mobility: Secondary | ICD-10-CM | POA: Diagnosis not present

## 2021-03-13 DIAGNOSIS — M25571 Pain in right ankle and joints of right foot: Secondary | ICD-10-CM | POA: Diagnosis not present

## 2021-03-17 NOTE — Progress Notes (Signed)

## 2021-03-19 ENCOUNTER — Other Ambulatory Visit: Payer: Self-pay

## 2021-03-19 ENCOUNTER — Encounter (HOSPITAL_BASED_OUTPATIENT_CLINIC_OR_DEPARTMENT_OTHER): Payer: Self-pay | Admitting: Orthopedic Surgery

## 2021-03-19 ENCOUNTER — Encounter (HOSPITAL_BASED_OUTPATIENT_CLINIC_OR_DEPARTMENT_OTHER)
Admission: RE | Disposition: A | Discharge status: Home / Self Care | Payer: Self-pay | Source: Home / Self Care | Attending: Orthopedic Surgery

## 2021-03-19 ENCOUNTER — Ambulatory Visit (HOSPITAL_BASED_OUTPATIENT_CLINIC_OR_DEPARTMENT_OTHER): Payer: Medicare Other | Admitting: Certified Registered"

## 2021-03-19 ENCOUNTER — Ambulatory Visit (HOSPITAL_BASED_OUTPATIENT_CLINIC_OR_DEPARTMENT_OTHER): Payer: Medicare Other

## 2021-03-19 ENCOUNTER — Ambulatory Visit (HOSPITAL_BASED_OUTPATIENT_CLINIC_OR_DEPARTMENT_OTHER)
Admission: RE | Admit: 2021-03-19 | Discharge: 2021-03-19 | Disposition: A | Discharge status: Home / Self Care | Payer: Medicare Other | Attending: Orthopedic Surgery | Admitting: Orthopedic Surgery

## 2021-03-19 DIAGNOSIS — M7661 Achilles tendinitis, right leg: Secondary | ICD-10-CM | POA: Diagnosis not present

## 2021-03-19 DIAGNOSIS — X58XXXA Exposure to other specified factors, initial encounter: Secondary | ICD-10-CM | POA: Insufficient documentation

## 2021-03-19 DIAGNOSIS — S86011A Strain of right Achilles tendon, initial encounter: Secondary | ICD-10-CM | POA: Diagnosis not present

## 2021-03-19 DIAGNOSIS — Y939 Activity, unspecified: Secondary | ICD-10-CM | POA: Insufficient documentation

## 2021-03-19 DIAGNOSIS — M6701 Short Achilles tendon (acquired), right ankle: Secondary | ICD-10-CM | POA: Diagnosis not present

## 2021-03-19 DIAGNOSIS — G8918 Other acute postprocedural pain: Secondary | ICD-10-CM | POA: Diagnosis not present

## 2021-03-19 HISTORY — PX: ACHILLES TENDON SURGERY: SHX542

## 2021-03-19 SURGERY — REPAIR, TENDON, ACHILLES
Anesthesia: General | Laterality: Right

## 2021-03-19 MED ORDER — ONDANSETRON HCL 4 MG/2ML IJ SOLN
INTRAMUSCULAR | Status: DC | PRN
  Administered 2021-03-19: 4 mg via INTRAVENOUS

## 2021-03-19 MED ORDER — CEFAZOLIN SODIUM-DEXTROSE 2-4 GM/100ML-% IV SOLN
INTRAVENOUS | Status: AC
  Filled 2021-03-19: qty 100

## 2021-03-19 MED ORDER — MIDAZOLAM HCL 2 MG/2ML IJ SOLN
INTRAMUSCULAR | Status: AC
  Filled 2021-03-19: qty 2

## 2021-03-19 MED ORDER — FENTANYL CITRATE (PF) 100 MCG/2ML IJ SOLN
50.0000 ug | Freq: Once | INTRAMUSCULAR | Status: AC
  Administered 2021-03-19: 12:00:00 50 ug via INTRAVENOUS

## 2021-03-19 MED ORDER — FENTANYL CITRATE (PF) 100 MCG/2ML IJ SOLN
INTRAMUSCULAR | Status: AC
  Filled 2021-03-19: qty 2

## 2021-03-19 MED ORDER — MIDAZOLAM HCL 2 MG/2ML IJ SOLN
1.0000 mg | Freq: Once | INTRAMUSCULAR | Status: AC
  Administered 2021-03-19: 12:00:00 1 mg via INTRAVENOUS

## 2021-03-19 MED ORDER — 0.9 % SODIUM CHLORIDE (POUR BTL) OPTIME
TOPICAL | Status: DC | PRN
  Administered 2021-03-19: 13:00:00 500 mL

## 2021-03-19 MED ORDER — LACTATED RINGERS IV SOLN: INTRAVENOUS | Status: DC

## 2021-03-19 MED ORDER — ROPIVACAINE HCL 5 MG/ML IJ SOLN: INTRAMUSCULAR | Status: DC | PRN

## 2021-03-19 MED ORDER — RIVAROXABAN 10 MG PO TABS: 10.0000 mg | ORAL_TABLET | Freq: Every day | ORAL | 0 refills | Status: DC

## 2021-03-19 MED ORDER — SUGAMMADEX SODIUM 200 MG/2ML IV SOLN
INTRAVENOUS | Status: DC | PRN
  Administered 2021-03-19: 200 mg via INTRAVENOUS

## 2021-03-19 MED ORDER — ROCURONIUM BROMIDE 100 MG/10ML IV SOLN
INTRAVENOUS | Status: DC | PRN
  Administered 2021-03-19: 50 mg via INTRAVENOUS

## 2021-03-19 MED ORDER — DEXAMETHASONE SODIUM PHOSPHATE 4 MG/ML IJ SOLN
INTRAMUSCULAR | Status: DC | PRN
  Administered 2021-03-19: 4 mg via PERINEURAL

## 2021-03-19 MED ORDER — FENTANYL CITRATE (PF) 100 MCG/2ML IJ SOLN: 25.0000 ug | INTRAMUSCULAR | Status: DC | PRN

## 2021-03-19 MED ORDER — OXYCODONE HCL 5 MG PO TABS: 5.0000 mg | ORAL_TABLET | Freq: Four times a day (QID) | ORAL | 0 refills | Status: AC | PRN

## 2021-03-19 MED ORDER — ROPIVACAINE HCL 5 MG/ML IJ SOLN
INTRAMUSCULAR | Status: DC | PRN
  Administered 2021-03-19: 30 mL via PERINEURAL

## 2021-03-19 MED ORDER — SODIUM CHLORIDE 0.9 % IV SOLN: INTRAVENOUS | Status: DC

## 2021-03-19 MED ORDER — EPHEDRINE SULFATE 50 MG/ML IJ SOLN
INTRAMUSCULAR | Status: DC | PRN
  Administered 2021-03-19: 5 mg via INTRAVENOUS
  Administered 2021-03-19 (×2): 10 mg via INTRAVENOUS

## 2021-03-19 MED ORDER — DEXAMETHASONE SODIUM PHOSPHATE 4 MG/ML IJ SOLN
INTRAMUSCULAR | Status: DC | PRN
  Administered 2021-03-19: 13:00:00 4 mg via INTRAVENOUS

## 2021-03-19 MED ORDER — CLONIDINE HCL (ANALGESIA) 100 MCG/ML EP SOLN
EPIDURAL | Status: DC | PRN
  Administered 2021-03-19: 80 ug

## 2021-03-19 MED ORDER — DROPERIDOL 2.5 MG/ML IJ SOLN: 0.6250 mg | Freq: Once | INTRAMUSCULAR | Status: DC | PRN

## 2021-03-19 MED ORDER — FENTANYL CITRATE (PF) 100 MCG/2ML IJ SOLN
INTRAMUSCULAR | Status: DC | PRN
  Administered 2021-03-19: 100 ug via INTRAVENOUS

## 2021-03-19 MED ORDER — VANCOMYCIN HCL 500 MG IV SOLR
INTRAVENOUS | Status: DC | PRN
  Administered 2021-03-19: 500 mg

## 2021-03-19 MED ORDER — LIDOCAINE HCL (CARDIAC) PF 100 MG/5ML IV SOSY
PREFILLED_SYRINGE | INTRAVENOUS | Status: DC | PRN
  Administered 2021-03-19: 30 mg via INTRAVENOUS

## 2021-03-19 MED ORDER — CEFAZOLIN SODIUM-DEXTROSE 2-4 GM/100ML-% IV SOLN
2.0000 g | INTRAVENOUS | Status: AC
  Administered 2021-03-19: 13:00:00 2 g via INTRAVENOUS

## 2021-03-19 SURGICAL SUPPLY — 73 items
APL PRP STRL LF DISP 70% ISPRP (MISCELLANEOUS) ×1
BANDAGE ESMARK 6X9 LF (GAUZE/BANDAGES/DRESSINGS) ×1 IMPLANT
BLADE AVERAGE 25X9 (BLADE) IMPLANT
BLADE MICRO SAGITTAL (BLADE) IMPLANT
BLADE SURG 15 STRL LF DISP TIS (BLADE) ×2 IMPLANT
BLADE SURG 15 STRL SS (BLADE) ×4
BNDG CMPR 9X6 STRL LF SNTH (GAUZE/BANDAGES/DRESSINGS) ×1
BNDG COHESIVE 4X5 TAN ST LF (GAUZE/BANDAGES/DRESSINGS) ×2 IMPLANT
BNDG COHESIVE 6X5 TAN ST LF (GAUZE/BANDAGES/DRESSINGS) ×2 IMPLANT
BNDG ESMARK 6X9 LF (GAUZE/BANDAGES/DRESSINGS) ×2
CANISTER SUCT 1200ML W/VALVE (MISCELLANEOUS) ×2 IMPLANT
CHLORAPREP W/TINT 26 (MISCELLANEOUS) ×2 IMPLANT
COVER BACK TABLE 60X90IN (DRAPES) ×2 IMPLANT
CUFF TOURN SGL QUICK 34 (TOURNIQUET CUFF) ×2
CUFF TRNQT CYL 34X4.125X (TOURNIQUET CUFF) ×1 IMPLANT
DRAPE EXTREMITY T 121X128X90 (DISPOSABLE) ×2 IMPLANT
DRAPE OEC MINIVIEW 54X84 (DRAPES) IMPLANT
DRAPE U-SHAPE 47X51 STRL (DRAPES) ×2 IMPLANT
DRSG MEPITEL 4X7.2 (GAUZE/BANDAGES/DRESSINGS) ×2 IMPLANT
DRSG PAD ABDOMINAL 8X10 ST (GAUZE/BANDAGES/DRESSINGS) ×4 IMPLANT
ELECT REM PT RETURN 9FT ADLT (ELECTROSURGICAL) ×2
ELECTRODE REM PT RTRN 9FT ADLT (ELECTROSURGICAL) ×1 IMPLANT
GAUZE SPONGE 4X4 12PLY STRL (GAUZE/BANDAGES/DRESSINGS) ×2 IMPLANT
GLOVE SRG 8 PF TXTR STRL LF DI (GLOVE) ×2 IMPLANT
GLOVE SURG ENC MOIS LTX SZ8 (GLOVE) ×2 IMPLANT
GLOVE SURG LTX SZ8 (GLOVE) ×2 IMPLANT
GLOVE SURG UNDER POLY LF SZ8 (GLOVE) ×4
GOWN STRL REUS W/ TWL LRG LVL3 (GOWN DISPOSABLE) ×1 IMPLANT
GOWN STRL REUS W/ TWL XL LVL3 (GOWN DISPOSABLE) ×2 IMPLANT
GOWN STRL REUS W/TWL LRG LVL3 (GOWN DISPOSABLE) ×2
GOWN STRL REUS W/TWL XL LVL3 (GOWN DISPOSABLE) ×4
KIT ACCESSORY DRILL 5 (KITS) ×1 IMPLANT
NDL HYPO 25X1 1.5 SAFETY (NEEDLE) IMPLANT
NDL SUT 6 .5 CRC .975X.05 MAYO (NEEDLE) IMPLANT
NEEDLE HYPO 25X1 1.5 SAFETY (NEEDLE) IMPLANT
NEEDLE MAYO TAPER (NEEDLE)
PACK BASIN DAY SURGERY FS (CUSTOM PROCEDURE TRAY) ×2 IMPLANT
PAD CAST 4YDX4 CTTN HI CHSV (CAST SUPPLIES) ×1 IMPLANT
PADDING CAST ABS 4INX4YD NS (CAST SUPPLIES)
PADDING CAST ABS COTTON 4X4 ST (CAST SUPPLIES) IMPLANT
PADDING CAST COTTON 4X4 STRL (CAST SUPPLIES) ×2
PADDING CAST COTTON 6X4 STRL (CAST SUPPLIES) ×2 IMPLANT
PENCIL SMOKE EVACUATOR (MISCELLANEOUS) ×2 IMPLANT
SANITIZER HAND PURELL 535ML FO (MISCELLANEOUS) ×2 IMPLANT
SCREW PEEK TENODESIS 6X12MM (Screw) ×1 IMPLANT
SHEET MEDIUM DRAPE 40X70 STRL (DRAPES) ×2 IMPLANT
SLEEVE SCD COMPRESS KNEE MED (STOCKING) ×2 IMPLANT
SPLINT FAST PLASTER 5X30 (CAST SUPPLIES) ×20
SPLINT PLASTER CAST FAST 5X30 (CAST SUPPLIES) ×20 IMPLANT
SPONGE T-LAP 18X18 ~~LOC~~+RFID (SPONGE) ×2 IMPLANT
STOCKINETTE 6  STRL (DRAPES) ×2
STOCKINETTE 6 STRL (DRAPES) ×1 IMPLANT
SUCTION FRAZIER HANDLE 10FR (MISCELLANEOUS) ×2
SUCTION TUBE FRAZIER 10FR DISP (MISCELLANEOUS) ×1 IMPLANT
SUT ETHILON 3 0 PS 1 (SUTURE) ×2 IMPLANT
SUT FIBERWIRE #2 38 T-5 BLUE (SUTURE)
SUT MNCRL AB 3-0 PS2 18 (SUTURE) ×4 IMPLANT
SUT VIC AB 0 CT1 27 (SUTURE) ×2
SUT VIC AB 0 CT1 27XBRD ANBCTR (SUTURE) IMPLANT
SUT VIC AB 1 CT1 27 (SUTURE) ×6
SUT VIC AB 1 CT1 27XBRD ANBCTR (SUTURE) IMPLANT
SUT VIC AB 2-0 SH 27 (SUTURE)
SUT VIC AB 2-0 SH 27XBRD (SUTURE) IMPLANT
SUT VICRYL 0 SH 27 (SUTURE) ×2 IMPLANT
SUTURE FIBERWR #2 38 T-5 BLUE (SUTURE) IMPLANT
SUTURE TAPE 1.3 FIBERLOP 20 ST (SUTURE) IMPLANT
SUTURETAPE 1.3 FIBERLOOP 20 ST (SUTURE)
SYR BULB EAR ULCER 3OZ GRN STR (SYRINGE) ×2 IMPLANT
SYR CONTROL 10ML LL (SYRINGE) IMPLANT
TOWEL GREEN STERILE FF (TOWEL DISPOSABLE) ×4 IMPLANT
TUBE CONNECTING 20X1/4 (TUBING) ×2 IMPLANT
UNDERPAD 30X36 HEAVY ABSORB (UNDERPADS AND DIAPERS) ×2 IMPLANT
YANKAUER SUCT BULB TIP NO VENT (SUCTIONS) IMPLANT

## 2021-03-19 NOTE — Discharge Instructions (Addendum)
Eric Arthurs, MD [facility]  Please read the following information regarding your care after surgery.  Medications  You only need a prescription for the narcotic pain medicine (ex. oxycodone, Percocet, Norco).  All of the other medicines listed below are available over the counter. X Aleve 2 pills twice a day for the first 3 days after surgery. X acetominophen (Tylenol) 650 mg every 4-6 hours as you need for minor to moderate pain X oxycodone as prescribed for severe pain  Narcotic pain medicine (ex. oxycodone, Percocet, Vicodin) will cause constipation.  To prevent this problem, take the following medicines while you are taking any pain medicine. X docusate sodium (Colace) 100 mg twice a day X senna (Senokot) 2 tablets twice a day  X To help prevent blood clots, take Xarelto daily for two weeks.  Then take a baby aspirin (81 mg) twice a day for the following month.  You should also get up every hour while you are awake to move around.    Weight Bearing  X Do not bear any weight on the operated leg or foot.  Cast / Splint / Dressing XKeep your splint, cast or dressing clean and dry.  Dont put anything (coat hanger, pencil, etc) down inside of it.  If it gets damp, use a hair dryer on the cool setting to dry it.  If it gets soaked, call the office to schedule an appointment for a cast change.  After your dressing, cast or splint is removed; you may shower, but do not soak or scrub the wound.  Allow the water to run over it, and then gently pat it dry.  Swelling It is normal for you to have swelling where you had surgery.  To reduce swelling and pain, keep your toes above your nose for at least 3 days after surgery.  It may be necessary to keep your foot or leg elevated for several weeks.  If it hurts, it should be elevated.  Follow Up Call my office at [phone] when you are discharged from the hospital or surgery center to schedule an appointment to be seen two weeks after  surgery.  Call my office at [phone] if you develop a fever >101.5 F, nausea, vomiting, bleeding from the surgical site or severe pain.    Regional Anesthesia Blocks  1. Numbness or the inability to move the "blocked" extremity may last from 3-48 hours after placement. The length of time depends on the medication injected and your individual response to the medication. If the numbness is not going away after 48 hours, call your surgeon.  2. The extremity that is blocked will need to be protected until the numbness is gone and the  Strength has returned. Because you cannot feel it, you will need to take extra care to avoid injury. Because it may be weak, you may have difficulty moving it or using it. You may not know what position it is in without looking at it while the block is in effect.  3. For blocks in the legs and feet, returning to weight bearing and walking needs to be done carefully. You will need to wait until the numbness is entirely gone and the strength has returned. You should be able to move your leg and foot normally before you try and bear weight or walk. You will need someone to be with you when you first try to ensure you do not fall and possibly risk injury.  4. Bruising and tenderness at the needle site are  common side effects and will resolve in a few days.  5. Persistent numbness or new problems with movement should be communicated to the surgeon or the [facility] [phone] [facility] [phone]).    Post Anesthesia Home Care Instructions  Activity: Get plenty of rest for the remainder of the day. A responsible individual must stay with you for 24 hours following the procedure.  For the next 24 hours, DO NOT: -Drive a car -Advertising copywriter -Drink alcoholic beverages -Take any medication unless instructed by your physician -Make any legal decisions or sign important papers.  Meals: Start with liquid foods such as gelatin or  soup. Progress to regular foods as tolerated. Avoid greasy, spicy, heavy foods. If nausea and/or vomiting occur, drink only clear liquids until the nausea and/or vomiting subsides. Call your physician if vomiting continues.  Special Instructions/Symptoms: Your throat may feel dry or sore from the anesthesia or the breathing tube placed in your throat during surgery. If this causes discomfort, gargle with warm salt water. The discomfort should disappear within 24 hours.  If you had a scopolamine patch placed behind your ear for the management of post- operative nausea and/or vomiting:  1. The medication in the patch is effective for 72 hours, after which it should be removed.  Wrap patch in a tissue and discard in the trash. Wash hands thoroughly with soap and water. 2. You may remove the patch earlier than 72 hours if you experience unpleasant side effects which may include dry mouth, dizziness or visual disturbances. 3. Avoid touching the patch. Wash your hands with soap and water after contact with the patch.

## 2021-03-19 NOTE — Anesthesia Procedure Notes (Signed)
Procedure Name: Intubation Date/Time: 03/19/2021 1:09 PM Performed by: Signe Colt, CRNA Pre-anesthesia Checklist: Patient identified, Emergency Drugs available, Suction available and Patient being monitored Patient Re-evaluated:Patient Re-evaluated prior to induction Oxygen Delivery Method: Circle system utilized Preoxygenation: Pre-oxygenation with 100% oxygen Induction Type: IV induction Ventilation: Mask ventilation without difficulty Laryngoscope Size: Mac and 3 Grade View: Grade I Tube type: Oral Tube size: 7.0 mm Number of attempts: 1 Airway Equipment and Method: Stylet and Oral airway Placement Confirmation: ETT inserted through vocal cords under direct vision, positive ETCO2 and breath sounds checked- equal and bilateral Secured at: 21 cm Tube secured with: Tape Dental Injury: Teeth and Oropharynx as per pre-operative assessment

## 2021-03-19 NOTE — Progress Notes (Signed)
Assisted Dr. Germeroth with right, ultrasound guided, popliteal block. Side rails up, monitors on throughout procedure. See vital signs in flow sheet. Tolerated Procedure well. 

## 2021-03-19 NOTE — Op Note (Signed)
03/19/2021  2:44 PM  PATIENT:  Eric Case  75 y.o. male  PRE-OPERATIVE DIAGNOSIS: 1.  Chronic right Achilles tendon rupture 2.  Short Achilles tendon  POST-OPERATIVE DIAGNOSIS: Same  Procedure(s): 1.  Right gastrocnemius recession through separate incision 2.  Deep transfer of the right flexor houses longus tendon to the calcaneus 3.  Right Achilles tendon reconstruction with plantaris autograft  SURGEON:  [name], MD  ASSISTANT: None  ANESTHESIA:   General, regional  EBL:  minimal   TOURNIQUET:   Total Tourniquet Time Documented: Thigh (Right) - 65 minutes Total: Thigh (Right) - 65 minutes  COMPLICATIONS:  None apparent  DISPOSITION:  Extubated, awake and stable to recovery.  INDICATION FOR PROCEDURE: 75 year old male without significant past medical history injured his ankle originally back in September while hiking.  He has a chronic Achilles tendon rupture and presents now for surgical treatment.  The risks and benefits of the alternative treatment options have been discussed in detail.  The patient wishes to proceed with surgery and specifically understands risks of bleeding, infection, nerve damage, blood clots, need for additional surgery, amputation and death.   PROCEDURE IN DETAIL:After preoperative consent was obtained and the correct operative site was identified, the patient was brought the operating room supine on a stretcher.  General anesthesia was induced.  Preoperative antibiotics were administered.  Surgical timeout was taken.  The patient was then turned into the prone position on the operating table with all bony prominences were padded.  The right lower extremity was prepped and draped in standard sterile fashion with a tourniquet around the thigh.  The extremity was exsanguinated and the tourniquet was inflated to 300 mmHg.  A longitudinal incision was then made over the calf.  Dissection was carried down through the subcutaneous tissues.  Care was  taken to protect the sural nerve and lesser saphenous vein.  The deep fascia was incised.  The gastrocnemius tendon was identified.  It was divided under direct vision in its entirety.  Attention was turned to the palpable defect at the Achilles tendon.  A longitudinal incision was made in the midline over the defect.  Dissection was carried sharply down through the subcutaneous tissues and peritenon creating full-thickness flaps medially and laterally.  The tendon rupture was identified.  There was significant scarring and fibrous tissue surrounding both tendon stumps.  The tendon ends were mobilized and retention sutures placed in the stumps.  The plantaris tendon was identified and was found to be intact.  It was found in the proximal incision and isolated.  It was transected proximally to be used for autograft in the tendon reconstruction.  The plantaris was withdrawn through the distal incision and wrapped in saline soaked gauze.  The 2 tendon stumps were then cleaned of all fibrous tissue.  Both were retracted out of the way allowing access to the deep posterior compartment.  The fascia was incised and released proximally and distally.  The flexor houses longus tendon was identified and traced distally into the tarsal tunnel.  The tibial nerve and posterior tibial artery were identified and retracted laterally.  They were protected while the FHL tendon was transected at the entry point into the tarsal tunnel.  This was performed with the ankle and hallux in maximal plantarflexion.  The FHL tendon was then whipstitched with 0 Vicryl.  A guidepin was inserted into the calcaneus just anterior to the Achilles tendon.  It was driven out the plantar aspect of the heel.  A 5  mm reamer was then advanced over the guidepin creating a socket down through the plantar aspect of the calcaneus.  A Beath pin was then used to pull the FHL tendon sutures down into the socket.  The ankle was plantarflexed and the tendon pulled  into the socket.  The tendon was fixed with a 6 mm [employer] medical bolt.  Attention was then returned to the Achilles tendon stumps.  The 2 stumps were approximated.  #1 Vicryl box sutures were placed in 1 cm increments starting 1 cm from the rupture site.  The ankle was plantarflexed maximally and the sutures tied from near to far.  The plantaris tendon was then woven through both ends of the Achilles tendon repair site in a zigzag fashion.  The plantaris tendon was then sewn into the repair with a circumferential running Silfverskiold suture of 3-0 Monocryl.  The ankle could be dorsiflexed almost to neutral with no gapping evident at the repair site.  The wound was then irrigated copiously distally and proximally.  Vancomycin powder was sprinkled in both wounds.  The peritenon was repaired in the distal incision with inverted simple sutures of 2-0 Vicryl.  The skin incision was closed with horizontal mattress sutures of 3-0 nylon.  Proximally the subcutaneous tissues were approximated with inverted simple sutures of 3-0 Monocryl and the skin incision closed with a running 3-0 nylon.  Sterile dressings were applied followed by a well-padded short leg splint with the ankle in gravity equinus.  The tourniquet was released after application of the dressings.  The patient was awakened from anesthesia and transported to the recovery room in stable condition.   FOLLOW UP PLAN: Nonweightbearing on the right lower extremity.  Xarelto for DVT prophylaxis for 2 weeks followed by aspirin for a month.  Follow-up in the office in 2 weeks for suture removal and conversion to a short leg cast.

## 2021-03-19 NOTE — Transfer of Care (Signed)
Immediate Anesthesia Transfer of Care Note  Patient: Eric Case  Procedure(s) Performed: Right Achilles tendon reconstruction with autograft FHL transfer to the calcaneus; gastroc recession (Right)  Patient Location: PACU  Anesthesia Type:GA combined with regional for post-op pain  Level of Consciousness: drowsy and patient cooperative  Airway & Oxygen Therapy: Patient Spontanous Breathing and Patient connected to face mask oxygen  Post-op Assessment: Report given to RN and Post -op Vital signs reviewed and stable  Post vital signs: Reviewed and stable  Last Vitals:  Vitals Value Taken Time  BP    Temp    Pulse 93 03/19/21 1439  Resp 18 03/19/21 1439  SpO2 97 % 03/19/21 1439  Vitals shown include unvalidated device data.  Last Pain:  Vitals:   03/19/21 1104  TempSrc: Oral  PainSc: 0-No pain         Complications: No notable events documented.

## 2021-03-19 NOTE — H&P (Signed)
Eric Case is an 75 y.o. male.   Chief Complaint: Right ankle pain HPI: 75 year old male without significant past medical history injured his ankle back in September.  An MRI recently showed a chronic rupture of the Achilles tendon.  He presents now for surgical reconstruction of this chronic Achilles tendon rupture.  Past Medical History:  Diagnosis Date   Anemia    on Iron- comes and goes per patient    Arthritis    Cataract    starting but very small- RIGHT REMOVED   Complication of anesthesia    severe headache after esophagus stretched years ago   Diverticulitis    Diverticulosis    GERD (gastroesophageal reflux disease)    Hemorrhoids    Hiatal hernia    Hx of adenomatous colonic polyps    Hypothyroidism    Peptic stricture of esophagus     Past Surgical History:  Procedure Laterality Date   CATARACT EXTRACTION Right    COLONOSCOPY     KNEE SURGERY Right    tendon tear    MENISCUS REPAIR Right    2015 and May 2017   POLYPECTOMY     SHOULDER SURGERY     bilateral   TOTAL KNEE ARTHROPLASTY Right 04/02/2016   Procedure: RIGHT TOTAL KNEE ARTHROPLASTY;  Surgeon: Eugenia Mcalpine, MD;  Location: [facility];  Service: Orthopedics;  Laterality: Right;  Adductor Block   UPPER GASTROINTESTINAL ENDOSCOPY      Family History  Problem Relation Age of Onset   Breast cancer Mother    Heart disease Mother    Colon cancer Cousin    Liver cancer Father    Prostate cancer Father    Prostate cancer Maternal Uncle    Prostate cancer Maternal Uncle    Prostate cancer Maternal Uncle    Colon polyps Neg Hx    Esophageal cancer Neg Hx    Rectal cancer Neg Hx    Stomach cancer Neg Hx    Social History:  reports that he has never smoked. He has never used smokeless tobacco. He reports that he does not drink alcohol and does not use drugs.  Allergies:  Allergies  Allergen Reactions   Ciprofloxacin Other (See Comments)    chest pain   Doxycycline Other (See Comments)    Hydromorphone Itching   Oxycodone-Acetaminophen Itching    REACTION: itching   Pennsaid [Diclofenac Sodium] Other (See Comments)    "made me feel bad all over" Flu like symptoms/fever    Sulfamethoxazole-Trimethoprim Rash    Medications Prior to Admission  Medication Sig Dispense Refill   Ascorbic Acid (VITAMIN C) 1000 MG tablet Take 1,000 mg by mouth daily.       co-enzyme Q-10 30 MG capsule Take 30 mg by mouth daily.      lansoprazole (PREVACID) 15 MG capsule Take 15 mg by mouth daily.     levothyroxine (SYNTHROID, LEVOTHROID) 112 MCG tablet Take 112 mcg by mouth daily.       Multiple Vitamin (MULTIVITAMIN WITH MINERALS) TABS tablet Take 1 tablet by mouth daily.     Nutritional Supplements (SALMON OIL) CAPS Take 1 capsule by mouth daily.     Saw Palmetto, Serenoa repens, (SAW PALMETTO PO) Take 250 mg by mouth 2 (two) times daily.      vitamin E 400 UNIT capsule Take 400 Units by mouth daily.       ferrous sulfate 325 (65 FE) MG tablet Take 325 mg by mouth daily as needed (for iron deficiency related  to prevacid).  (Patient not taking: Reported on 09/11/2020)      No results found for this or any previous visit (from the past 48 hour(s)). DG MINI C-ARM IMAGE ONLY  Result Date: 04-07-21 There is no interpretation for this exam.  This order is for images obtained during a surgical procedure.  Please See "Surgeries" Tab for more information regarding the procedure.   DG MINI C-ARM IMAGE ONLY  Result Date: April 07, 2021 There is no interpretation for this exam.  This order is for images obtained during a surgical procedure.  Please See "Surgeries" Tab for more information regarding the procedure.    Review of Systems no recent fever, chills, nausea, vomiting or changes in his appetite  Blood pressure 127/76, pulse 60, temperature (!) 97.5 F (36.4 C), temperature source Oral, resp. rate 13, height 5\' 8"  (1.727 m), weight 90.5 kg, SpO2 97 %. Physical Exam  Well-nourished  well-developed man in no apparent distress.  Alert and oriented.  Normal mood and affect.  Gait is antalgic to the right.  There is a palpable defect in the tendon.  4 out of 5 strength in plantarflexion.  No lymphadenopathy.  Pulses are palpable in the foot.  Intact sensibility to light touch dorsally and plantarly at the forefoot.   Assessment/Plan Chronic right Achilles tendon rupture and short Achilles tendon -to the operating room today for gastrocnemius recession, Achilles tendon reconstruction and FHL transfer to the calcaneus.  The risks and benefits of the alternative treatment options have been discussed in detail.  The patient wishes to proceed with surgery and specifically understands risks of bleeding, infection, nerve damage, blood clots, need for additional surgery, amputation and death.   , MD 04-07-2021, 12:45 PM

## 2021-03-19 NOTE — Anesthesia Procedure Notes (Addendum)
Anesthesia Regional Block: Popliteal block   Pre-Anesthetic Checklist: , timeout performed,  Correct Patient, Correct Site, Correct Laterality,  Correct Procedure, Correct Position, site marked,  Risks and benefits discussed,  Surgical consent,  Pre-op evaluation,  At surgeon's request and post-op pain management  Laterality: Lower and Right  Prep: chloraprep       Needles:  Injection technique: Single-shot  Needle Type: Stimiplex     Needle Length: 10cm  Needle Gauge: 21     Additional Needles:   Procedures:,,,, ultrasound used (permanent image in chart),,   Motor weakness within 5 minutes.  Narrative:  Start time: 03/19/2021 11:35 AM End time: 03/19/2021 11:55 AM Injection made incrementally with aspirations every 5 mL.  Performed by: Personally  Anesthesiologist: Lewie Loron, MD  Additional Notes: Nerve located and needle positioned with direct ultrasound guidance. Good perineural spread. Patient tolerated well.

## 2021-03-19 NOTE — Anesthesia Postprocedure Evaluation (Signed)
Anesthesia Post Note  Patient: Eric Case  Procedure(s) Performed: Right Achilles tendon reconstruction with autograft FHL transfer to the calcaneus; gastroc recession (Right)     Patient location during evaluation: PACU Anesthesia Type: General Level of consciousness: sedated and patient cooperative Pain management: pain level controlled Vital Signs Assessment: post-procedure vital signs reviewed and stable Respiratory status: spontaneous breathing Cardiovascular status: stable Anesthetic complications: no   No notable events documented.  Last Vitals:  Vitals:   03/19/21 1445 03/19/21 1500  BP: (!) 143/76 130/85  Pulse: 93 87  Resp: 18 11  Temp:    SpO2: 99% 95%    Last Pain:  Vitals:   03/19/21 1530  TempSrc:   PainSc: 0-No pain                 Lewie Loron

## 2021-03-19 NOTE — Anesthesia Preprocedure Evaluation (Signed)
Anesthesia Evaluation  Patient identified by MRN, date of birth, ID band Patient awake    Reviewed: Allergy & Precautions, NPO status , Patient's Chart, lab work & pertinent test results  Airway Mallampati: II  TM Distance: >3 FB Neck ROM: Full    Dental  (+) Dental Advisory Given, Teeth Intact   Pulmonary neg pulmonary ROS,    Pulmonary exam normal breath sounds clear to auscultation       Cardiovascular negative cardio ROS Normal cardiovascular exam Rhythm:Regular Rate:Normal     Neuro/Psych negative neurological ROS  negative psych ROS   GI/Hepatic hiatal hernia, GERD  Medicated and Controlled,  Endo/Other  Hypothyroidism   Renal/GU      Musculoskeletal  (+) Arthritis ,   Abdominal   Peds  Hematology  (+) Blood dyscrasia, anemia ,   Anesthesia Other Findings   Reproductive/Obstetrics                             Anesthesia Physical  Anesthesia Plan  ASA: 2  Anesthesia Plan: General   Post-op Pain Management:  Regional for Post-op pain and Regional block, Tylenol PO (pre-op) and Minimal or no pain anticipated   Induction: Intravenous  PONV Risk Score and Plan: Ondansetron, Treatment may vary due to age or medical condition, Midazolam and Dexamethasone  Airway Management Planned:   Additional Equipment: None  Intra-op Plan:   Post-operative Plan: Extubation in OR  Informed Consent: I have reviewed the patients History and Physical, chart, labs and discussed the procedure including the risks, benefits and alternatives for the proposed anesthesia with the patient or authorized representative who has indicated his/her understanding and acceptance.     Dental advisory given  Plan Discussed with: CRNA  Anesthesia Plan Comments:         Anesthesia Quick Evaluation

## 2021-03-24 ENCOUNTER — Encounter (HOSPITAL_BASED_OUTPATIENT_CLINIC_OR_DEPARTMENT_OTHER): Payer: Self-pay | Admitting: Orthopedic Surgery

## 2021-04-01 DIAGNOSIS — S86091A Other specified injury of right Achilles tendon, initial encounter: Secondary | ICD-10-CM | POA: Diagnosis not present

## 2021-04-01 DIAGNOSIS — Z4889 Encounter for other specified surgical aftercare: Secondary | ICD-10-CM | POA: Diagnosis not present

## 2021-04-15 DIAGNOSIS — Z4789 Encounter for other orthopedic aftercare: Secondary | ICD-10-CM | POA: Diagnosis not present

## 2021-05-14 DIAGNOSIS — H40023 Open angle with borderline findings, high risk, bilateral: Secondary | ICD-10-CM | POA: Diagnosis not present

## 2021-05-14 DIAGNOSIS — H25012 Cortical age-related cataract, left eye: Secondary | ICD-10-CM | POA: Diagnosis not present

## 2021-05-14 DIAGNOSIS — H2512 Age-related nuclear cataract, left eye: Secondary | ICD-10-CM | POA: Diagnosis not present

## 2021-05-14 DIAGNOSIS — Z961 Presence of intraocular lens: Secondary | ICD-10-CM | POA: Diagnosis not present

## 2021-05-18 DIAGNOSIS — M25571 Pain in right ankle and joints of right foot: Secondary | ICD-10-CM | POA: Diagnosis not present

## 2021-05-18 DIAGNOSIS — S86011D Strain of right Achilles tendon, subsequent encounter: Secondary | ICD-10-CM | POA: Diagnosis not present

## 2021-05-18 DIAGNOSIS — Z4789 Encounter for other orthopedic aftercare: Secondary | ICD-10-CM | POA: Diagnosis not present

## 2021-05-20 DIAGNOSIS — M25571 Pain in right ankle and joints of right foot: Secondary | ICD-10-CM | POA: Diagnosis not present

## 2021-05-20 DIAGNOSIS — Z4789 Encounter for other orthopedic aftercare: Secondary | ICD-10-CM | POA: Diagnosis not present

## 2021-05-20 DIAGNOSIS — S86011D Strain of right Achilles tendon, subsequent encounter: Secondary | ICD-10-CM | POA: Diagnosis not present

## 2021-05-25 DIAGNOSIS — Z4789 Encounter for other orthopedic aftercare: Secondary | ICD-10-CM | POA: Diagnosis not present

## 2021-05-25 DIAGNOSIS — S86011D Strain of right Achilles tendon, subsequent encounter: Secondary | ICD-10-CM | POA: Diagnosis not present

## 2021-05-25 DIAGNOSIS — M25571 Pain in right ankle and joints of right foot: Secondary | ICD-10-CM | POA: Diagnosis not present

## 2021-05-28 DIAGNOSIS — Z4789 Encounter for other orthopedic aftercare: Secondary | ICD-10-CM | POA: Diagnosis not present

## 2021-05-28 DIAGNOSIS — M25571 Pain in right ankle and joints of right foot: Secondary | ICD-10-CM | POA: Diagnosis not present

## 2021-05-28 DIAGNOSIS — S86011D Strain of right Achilles tendon, subsequent encounter: Secondary | ICD-10-CM | POA: Diagnosis not present

## 2021-06-01 DIAGNOSIS — S86011D Strain of right Achilles tendon, subsequent encounter: Secondary | ICD-10-CM | POA: Diagnosis not present

## 2021-06-01 DIAGNOSIS — M25571 Pain in right ankle and joints of right foot: Secondary | ICD-10-CM | POA: Diagnosis not present

## 2021-06-01 DIAGNOSIS — Z4789 Encounter for other orthopedic aftercare: Secondary | ICD-10-CM | POA: Diagnosis not present

## 2021-06-03 DIAGNOSIS — S86011D Strain of right Achilles tendon, subsequent encounter: Secondary | ICD-10-CM | POA: Diagnosis not present

## 2021-06-03 DIAGNOSIS — Z4789 Encounter for other orthopedic aftercare: Secondary | ICD-10-CM | POA: Diagnosis not present

## 2021-06-03 DIAGNOSIS — M25571 Pain in right ankle and joints of right foot: Secondary | ICD-10-CM | POA: Diagnosis not present

## 2021-06-08 DIAGNOSIS — Z4789 Encounter for other orthopedic aftercare: Secondary | ICD-10-CM | POA: Diagnosis not present

## 2021-06-08 DIAGNOSIS — M25571 Pain in right ankle and joints of right foot: Secondary | ICD-10-CM | POA: Diagnosis not present

## 2021-06-08 DIAGNOSIS — S86011D Strain of right Achilles tendon, subsequent encounter: Secondary | ICD-10-CM | POA: Diagnosis not present

## 2021-06-11 DIAGNOSIS — Z4789 Encounter for other orthopedic aftercare: Secondary | ICD-10-CM | POA: Diagnosis not present

## 2021-06-11 DIAGNOSIS — S86011D Strain of right Achilles tendon, subsequent encounter: Secondary | ICD-10-CM | POA: Diagnosis not present

## 2021-06-11 DIAGNOSIS — M25571 Pain in right ankle and joints of right foot: Secondary | ICD-10-CM | POA: Diagnosis not present

## 2021-06-17 DIAGNOSIS — S86011D Strain of right Achilles tendon, subsequent encounter: Secondary | ICD-10-CM | POA: Diagnosis not present

## 2021-06-17 DIAGNOSIS — Z4789 Encounter for other orthopedic aftercare: Secondary | ICD-10-CM | POA: Diagnosis not present

## 2021-06-17 DIAGNOSIS — M25571 Pain in right ankle and joints of right foot: Secondary | ICD-10-CM | POA: Diagnosis not present

## 2021-06-19 DIAGNOSIS — S86011D Strain of right Achilles tendon, subsequent encounter: Secondary | ICD-10-CM | POA: Diagnosis not present

## 2021-06-19 DIAGNOSIS — M25571 Pain in right ankle and joints of right foot: Secondary | ICD-10-CM | POA: Diagnosis not present

## 2021-06-19 DIAGNOSIS — Z4789 Encounter for other orthopedic aftercare: Secondary | ICD-10-CM | POA: Diagnosis not present

## 2021-10-21 DIAGNOSIS — R413 Other amnesia: Secondary | ICD-10-CM | POA: Diagnosis not present

## 2021-10-21 DIAGNOSIS — R2689 Other abnormalities of gait and mobility: Secondary | ICD-10-CM | POA: Diagnosis not present

## 2021-10-21 DIAGNOSIS — E039 Hypothyroidism, unspecified: Secondary | ICD-10-CM | POA: Diagnosis not present

## 2021-10-27 DIAGNOSIS — R413 Other amnesia: Secondary | ICD-10-CM | POA: Diagnosis not present

## 2021-10-30 DIAGNOSIS — N401 Enlarged prostate with lower urinary tract symptoms: Secondary | ICD-10-CM | POA: Diagnosis not present

## 2021-10-30 DIAGNOSIS — N5201 Erectile dysfunction due to arterial insufficiency: Secondary | ICD-10-CM | POA: Diagnosis not present

## 2021-10-30 DIAGNOSIS — R3915 Urgency of urination: Secondary | ICD-10-CM | POA: Diagnosis not present

## 2021-11-02 DIAGNOSIS — R2689 Other abnormalities of gait and mobility: Secondary | ICD-10-CM | POA: Diagnosis not present

## 2021-11-02 DIAGNOSIS — H60502 Unspecified acute noninfective otitis externa, left ear: Secondary | ICD-10-CM | POA: Diagnosis not present

## 2021-11-02 DIAGNOSIS — H9192 Unspecified hearing loss, left ear: Secondary | ICD-10-CM | POA: Diagnosis not present

## 2021-11-02 DIAGNOSIS — R413 Other amnesia: Secondary | ICD-10-CM | POA: Diagnosis not present

## 2021-11-05 ENCOUNTER — Encounter: Payer: Self-pay | Admitting: *Deleted

## 2021-11-09 ENCOUNTER — Encounter: Payer: Self-pay | Admitting: Psychiatry

## 2021-11-09 ENCOUNTER — Ambulatory Visit: Payer: Medicare Other | Admitting: Psychiatry

## 2021-11-09 VITALS — BP 147/85 | HR 73 | Ht 68.0 in | Wt 205.0 lb

## 2021-11-09 DIAGNOSIS — R413 Other amnesia: Secondary | ICD-10-CM

## 2021-11-09 DIAGNOSIS — R2689 Other abnormalities of gait and mobility: Secondary | ICD-10-CM | POA: Diagnosis not present

## 2021-11-09 DIAGNOSIS — G629 Polyneuropathy, unspecified: Secondary | ICD-10-CM | POA: Diagnosis not present

## 2021-11-09 NOTE — Patient Instructions (Addendum)
Plan:  MRI brain Blood work - A1c and B12  MOCA scoring: Over 25: normal 18-25 points: Mild cognitive impairment. 10-17 points: Moderate cognitive impairment, possible dementia Fewer than 10 points: Severe cognitive impairment, dementia

## 2021-11-09 NOTE — Progress Notes (Signed)
[facility]  PATIENT: Eric Case DOB: Feb 03, 1946  REFERRING CLINICIAN: Alysia Penna, MD HISTORY FROM: self, wife Eric Case REASON FOR VISIT: memory loss, imbalance   HISTORICAL  CHIEF COMPLAINT:  Chief Complaint  Patient presents with   Balance issues, memory    Rm 8 New Pt  wifeLupita Case  [facility] 89    HISTORY OF PRESENT ILLNESS:  The patient presents for evaluation of memory loss which has been present since November 2022. Had minor memory issues before then but this was felt to be more age related. Then in November he developed COVID. A few days later he fell while walking down the driveway. He did hit his head at the time. Presented to the ED where [facility] showed generalized atrophy and chronic small vessel ischemic disease. Memory started to worsen after this. He then had achilles tendon surgery in December which also seemed to affect his memory.  Memory has been slowly worsening since that time. Has been struggling with word finding difficulty. For example could not think of the word "refrigerator" recently. Wife will sometimes have to repeat herself in conversations as he struggles to process things as quickly as he used it. Will have to stop and think about the next step when performing tasks (getting cereal, starting the car, using carpenter tools). Earlier this month he used rubbing alcohol for mouthwash.  He has also noticed imbalance since the fall in November. He is still able to walk a hiking trail and on his steep driveway every day without falling. Has always leaned forward while walking, but seems to be leaning forward more recently. Did have a fall a couple of weeks ago, but this was a mechanical fall (feet got tangled in brush). Did not hit his head.   TBI: Larey Seat and hit his head in November 2022 Stroke:  no past history of stroke Seizures:  no past history of seizures Sleep: No history of sleep apnea. Snores intermittently at night. No daytime  sleepiness Mood:  patient denies anxiety and depression  Functional status:  Patient lives with his wife Cooking: Will take time to remember how to fix hi scereal Driving: Dries to familiar places wihtout issues. Had one time where he had to sit and think about how to put the car in drive from the park position. Bills: Wife took over for finances after he fell Medications: had some issues so he go ta pillbox Ever left the stove on by accident?: no Forget how to use items around the house?: yes Getting lost going to familiar places?: no Forgetting loved ones names?: no Word finding difficulty? yes  OTHER MEDICAL CONDITIONS: GERD, hypothyroidism   REVIEW OF SYSTEMS: Full 14 system review of systems performed and negative with exception of: memory loss, imbalance  ALLERGIES: Allergies  Allergen Reactions   Ciprofloxacin Other (See Comments)    chest pain   Doxycycline Other (See Comments)   Hydromorphone Itching   Nsaids     Other reaction(s): Unknown   Oxycodone-Acetaminophen Itching    REACTION: itching   Pennsaid [Diclofenac Sodium] Other (See Comments)    "made me feel bad all over" Flu like symptoms/fever    Percocet [Oxycodone-Acetaminophen]     unknown   Sulfamethoxazole-Trimethoprim Rash    septra    HOME MEDICATIONS: Outpatient Medications Prior to Visit  Medication Sig Dispense Refill   Ascorbic Acid (VITAMIN C) 1000 MG tablet Take 1,000 mg by mouth daily.       co-enzyme Q-10 30 MG capsule  Take 30 mg by mouth daily.      ferrous sulfate 325 (65 FE) MG tablet Take 325 mg by mouth daily as needed (for iron deficiency related to prevacid).     lansoprazole (PREVACID) 15 MG capsule Take 15 mg by mouth daily.     levothyroxine (SYNTHROID, LEVOTHROID) 112 MCG tablet Take 112 mcg by mouth daily.       Multiple Vitamin (MULTIVITAMIN WITH MINERALS) TABS tablet Take 1 tablet by mouth daily.     Nutritional Supplements (SALMON OIL) CAPS Take 1 capsule by mouth daily.      Saw Palmetto, Serenoa repens, (SAW PALMETTO PO) Take 250 mg by mouth 2 (two) times daily.      vitamin E 400 UNIT capsule Take 400 Units by mouth daily.       rivaroxaban (XARELTO) 10 MG TABS tablet Take 1 tablet (10 mg total) by mouth daily for 14 days. 14 tablet 0   No facility-administered medications prior to visit.    PAST MEDICAL HISTORY: Past Medical History:  Diagnosis Date   Anemia    on Iron- comes and goes per patient    Arthritis    Cataract    starting but very small- RIGHT REMOVED   Complication of anesthesia    severe headache after esophagus stretched years ago   Diverticulitis    Diverticulosis    GERD (gastroesophageal reflux disease)    Hemorrhoids    Hiatal hernia    Hx of adenomatous colonic polyps    Hypothyroidism    Migraine, unspecified, not intractable, without status migrainosus    Peptic stricture of esophagus     PAST SURGICAL HISTORY: Past Surgical History:  Procedure Laterality Date   ACHILLES TENDON SURGERY Right 03/19/2021   Procedure: Right Achilles tendon reconstruction with autograft FHL transfer to the calcaneus; gastroc recession;  Surgeon: Eric Arthurs, MD;  Location: [facility];  Service: Orthopedics;  Laterality: Right;    CATARACT EXTRACTION Right    COLONOSCOPY     KNEE SURGERY Right    tendon tear    MENISCUS REPAIR Right    2015 and May 2017   POLYPECTOMY     SHOULDER SURGERY     bilateral   TOTAL KNEE ARTHROPLASTY Right 04/02/2016   Procedure: RIGHT TOTAL KNEE ARTHROPLASTY;  Surgeon: Eric Mcalpine, MD;  Location: [facility];  Service: Orthopedics;  Laterality: Right;  Adductor Block   UPPER GASTROINTESTINAL ENDOSCOPY      FAMILY HISTORY: Family History  Problem Relation Age of Onset   Breast cancer Mother    Heart disease Mother    Liver cancer Father    Prostate cancer Father    Prostate cancer Maternal Uncle    Prostate cancer Maternal Uncle    Prostate cancer Maternal Uncle    Colon cancer  Cousin    Colon polyps Neg Hx    Esophageal cancer Neg Hx    Rectal cancer Neg Hx    Stomach cancer Neg Hx     SOCIAL HISTORY: Social History   Socioeconomic History   Marital status: Married    Spouse name: Eric Case   Number of children: 1   Years of education: Not on file   Highest education level: Associate degree: occupational, Scientist, product/process development, or vocational program  Occupational History   Occupation: carpenter-retired  Tobacco Use   Smoking status: Never   Smokeless tobacco: Never  Substance and Sexual Activity   Alcohol use: No    Alcohol/week: 0.0 standard drinks of  alcohol   Drug use: No   Sexual activity: Not on file  Other Topics Concern   Not on file  Social History Narrative   11/09/21 Live with wife   Daily caffeine   Social Determinants of Health   Financial Resource Strain: Not on file  Food Insecurity: Not on file  Transportation Needs: Not on file  Physical Activity: Not on file  Stress: Not on file  Social Connections: Not on file  Intimate Partner Violence: Not on file     PHYSICAL EXAM  GENERAL EXAM/CONSTITUTIONAL: Vitals:  Vitals:   11/09/21 1321  BP: (!) 147/85  Pulse: 73  Weight: 205 lb (93 kg)  Height: 5\' 8"  (1.727 m)   Body mass index is 31.17 kg/m. Wt Readings from Last 3 Encounters:  11/09/21 205 lb (93 kg)  03/19/21 199 lb 8.3 oz (90.5 kg)  09/11/20 200 lb (90.7 kg)   NEUROLOGIC: MENTAL STATUS:     11/09/2021    1:26 PM  Montreal Cognitive Assessment   Visuospatial/ Executive (0/5) 2  Naming (0/3) 1  Attention: Read list of digits (0/2) 1  Attention: Read list of letters (0/1) 1  Attention: Serial 7 subtraction starting at 100 (0/3) 2  Language: Repeat phrase (0/2) 1  Language : Fluency (0/1) 0  Abstraction (0/2) 2  Delayed Recall (0/5) 0  Orientation (0/6) 4  Total 14     CRANIAL NERVE:  2nd, 3rd, 4th, 6th - pupils equal and reactive to light, visual fields full to confrontation, extraocular muscles intact, no  nystagmus 5th - facial sensation symmetric 7th - facial strength symmetric 8th - hearing intact 9th - palate elevates symmetrically, uvula midline 11th - shoulder shrug symmetric 12th - tongue protrusion midline  MOTOR:  normal bulk and tone, no cogwheeling, full strength in the BUE, BLE  SENSORY:  Decreased sensation to pinprick left foot, decreased vibration bilateral feet  COORDINATION:  finger-nose-finger, fine finger movements normal, no tremor  REFLEXES:  deep tendon reflexes present and symmetric  GAIT/STATION:  Wide based gait, stooped posture, decreased arm swing on left     DIAGNOSTIC DATA (LABS, IMAGING, TESTING) - I reviewed patient records, labs, notes, testing and imaging myself where available.  Lab Results  Component Value Date   WBC 11.7 (H) 02/17/2021   HGB 14.9 02/17/2021   HCT 46.2 02/17/2021   MCV 85.1 02/17/2021   PLT 276 02/17/2021      Component Value Date/Time   NA 134 (L) 02/17/2021 1014   K 3.9 02/17/2021 1014   CL 103 02/17/2021 1014   CO2 24 02/17/2021 1014   GLUCOSE 119 (H) 02/17/2021 1014   BUN 19 02/17/2021 1014   CREATININE 0.84 02/17/2021 1014   CALCIUM 8.7 (L) 02/17/2021 1014   PROT 7.4 02/17/2021 1014   ALBUMIN 4.3 02/17/2021 1014   AST 28 02/17/2021 1014   ALT 24 02/17/2021 1014   ALKPHOS 57 02/17/2021 1014   BILITOT 0.6 02/17/2021 1014   GFRNONAA >60 02/17/2021 1014   GFRAA >60 04/05/2016 0919   TSH and RPR in August reportedly norma    ASSESSMENT AND PLAN  76 y.o. year old male with a history of GERD and hypothyroidism who presents for evaluation of worsening memory loss and imbalance following COVID infection and a fall in November 2022. MOCA today is 14/30, which is concerning for dementia. Would not expect brain fog from COVID to continue to worsen over time. Will order MRI brain to assess for degenerative changes and significant  vascular disease. Exam also reveals decreased sensation to pinprick and vibration in  bilateral feet, which can contribute to imbalance. Will check B12 and A1c levels today.  He does have decreased arm swing and stooped posture (reportedly this is baseline), but currently no other signs of parkinsonism today. Will continue to monitor for now.   1. Memory loss   2. Imbalance   3. Neuropathy       PLAN: - Labs: A1c, B12 - MRI brain - Follow up after testing is complete.   Orders Placed This Encounter  Procedures   MR BRAIN W WO CONTRAST   Vitamin B12   Hemoglobin A1c     Return in about 4 months (around 03/11/2022).  I spent an average of 44 minutes chart reviewing and counseling the patient, with at least 50% of the time face to face with the patient. General brain health measures discussed, including the importance of regular aerobic exercise. Reviewed safety measures including driving safety.   Ocie Doyne, MD 11/09/21 3:06 PM  [facility] [address] [place], [place] [address] [phone]

## 2021-11-10 LAB — VITAMIN B12: Vitamin B-12: 1310 pg/mL — ABNORMAL HIGH (ref 232–1245)

## 2021-11-10 LAB — HEMOGLOBIN A1C
Est. average glucose Bld gHb Est-mCnc: 126 mg/dL
Hgb A1c MFr Bld: 6 % — ABNORMAL HIGH (ref 4.8–5.6)

## 2021-11-11 DIAGNOSIS — H25012 Cortical age-related cataract, left eye: Secondary | ICD-10-CM | POA: Diagnosis not present

## 2021-11-11 DIAGNOSIS — Z961 Presence of intraocular lens: Secondary | ICD-10-CM | POA: Diagnosis not present

## 2021-11-11 DIAGNOSIS — H40023 Open angle with borderline findings, high risk, bilateral: Secondary | ICD-10-CM | POA: Diagnosis not present

## 2021-11-11 DIAGNOSIS — H2512 Age-related nuclear cataract, left eye: Secondary | ICD-10-CM | POA: Diagnosis not present

## 2021-11-12 ENCOUNTER — Telehealth: Payer: Self-pay | Admitting: Psychiatry

## 2021-11-12 NOTE — Telephone Encounter (Signed)
[employer] medicare Berkley Kasim: [id number] (exp. 11/12/21 to 12/11/21) order sent to [facility], they will reach out to the patient to schedule.

## 2021-11-27 ENCOUNTER — Ambulatory Visit
Admission: RE | Admit: 2021-11-27 | Discharge: 2021-11-27 | Disposition: A | Discharge status: Home / Self Care | Payer: Medicare Other | Source: Ambulatory Visit | Attending: Psychiatry | Admitting: Psychiatry

## 2021-11-27 DIAGNOSIS — R413 Other amnesia: Secondary | ICD-10-CM

## 2021-11-27 DIAGNOSIS — R2689 Other abnormalities of gait and mobility: Secondary | ICD-10-CM | POA: Diagnosis not present

## 2021-11-27 MED ORDER — GADOBENATE DIMEGLUMINE 529 MG/ML IV SOLN
15.0000 mL | Freq: Once | INTRAVENOUS | Status: AC | PRN
  Administered 2021-11-27: 15 mL via INTRAVENOUS

## 2021-12-03 DIAGNOSIS — Z85828 Personal history of other malignant neoplasm of skin: Secondary | ICD-10-CM | POA: Diagnosis not present

## 2021-12-03 DIAGNOSIS — L570 Actinic keratosis: Secondary | ICD-10-CM | POA: Diagnosis not present

## 2021-12-03 DIAGNOSIS — L814 Other melanin hyperpigmentation: Secondary | ICD-10-CM | POA: Diagnosis not present

## 2021-12-03 DIAGNOSIS — D2270 Melanocytic nevi of unspecified lower limb, including hip: Secondary | ICD-10-CM | POA: Diagnosis not present

## 2021-12-03 DIAGNOSIS — L821 Other seborrheic keratosis: Secondary | ICD-10-CM | POA: Diagnosis not present

## 2021-12-04 ENCOUNTER — Encounter (INDEPENDENT_AMBULATORY_CARE_PROVIDER_SITE_OTHER): Payer: Medicare Other | Admitting: Psychiatry

## 2021-12-04 DIAGNOSIS — F0390 Unspecified dementia without behavioral disturbance: Secondary | ICD-10-CM

## 2021-12-08 NOTE — Telephone Encounter (Signed)

## 2022-02-01 DIAGNOSIS — E039 Hypothyroidism, unspecified: Secondary | ICD-10-CM | POA: Diagnosis not present

## 2022-02-01 DIAGNOSIS — I10 Essential (primary) hypertension: Secondary | ICD-10-CM | POA: Diagnosis not present

## 2022-02-08 DIAGNOSIS — R82998 Other abnormal findings in urine: Secondary | ICD-10-CM | POA: Diagnosis not present

## 2022-02-08 DIAGNOSIS — N401 Enlarged prostate with lower urinary tract symptoms: Secondary | ICD-10-CM | POA: Diagnosis not present

## 2022-02-08 DIAGNOSIS — R7303 Prediabetes: Secondary | ICD-10-CM | POA: Diagnosis not present

## 2022-02-08 DIAGNOSIS — I10 Essential (primary) hypertension: Secondary | ICD-10-CM | POA: Diagnosis not present

## 2022-02-08 DIAGNOSIS — Z0000 Encounter for general adult medical examination without abnormal findings: Secondary | ICD-10-CM | POA: Diagnosis not present

## 2022-03-10 DIAGNOSIS — I6603 Occlusion and stenosis of bilateral middle cerebral arteries: Secondary | ICD-10-CM | POA: Diagnosis not present

## 2022-03-10 DIAGNOSIS — I6503 Occlusion and stenosis of bilateral vertebral arteries: Secondary | ICD-10-CM | POA: Diagnosis not present

## 2022-03-10 DIAGNOSIS — G319 Degenerative disease of nervous system, unspecified: Secondary | ICD-10-CM | POA: Diagnosis not present

## 2022-03-10 DIAGNOSIS — R413 Other amnesia: Secondary | ICD-10-CM | POA: Diagnosis not present

## 2022-03-10 DIAGNOSIS — R2689 Other abnormalities of gait and mobility: Secondary | ICD-10-CM | POA: Diagnosis not present

## 2022-03-10 DIAGNOSIS — R42 Dizziness and giddiness: Secondary | ICD-10-CM | POA: Diagnosis not present

## 2022-03-10 DIAGNOSIS — E039 Hypothyroidism, unspecified: Secondary | ICD-10-CM | POA: Diagnosis not present

## 2022-03-10 DIAGNOSIS — Z7982 Long term (current) use of aspirin: Secondary | ICD-10-CM | POA: Diagnosis not present

## 2022-03-10 DIAGNOSIS — I6523 Occlusion and stenosis of bilateral carotid arteries: Secondary | ICD-10-CM | POA: Diagnosis not present

## 2022-03-10 DIAGNOSIS — K219 Gastro-esophageal reflux disease without esophagitis: Secondary | ICD-10-CM | POA: Diagnosis not present

## 2022-03-10 DIAGNOSIS — Z79899 Other long term (current) drug therapy: Secondary | ICD-10-CM | POA: Diagnosis not present

## 2022-03-10 DIAGNOSIS — G459 Transient cerebral ischemic attack, unspecified: Secondary | ICD-10-CM | POA: Diagnosis not present

## 2022-03-10 DIAGNOSIS — I4510 Unspecified right bundle-branch block: Secondary | ICD-10-CM | POA: Diagnosis not present

## 2022-03-10 DIAGNOSIS — R4701 Aphasia: Secondary | ICD-10-CM | POA: Diagnosis not present

## 2022-03-10 DIAGNOSIS — I444 Left anterior fascicular block: Secondary | ICD-10-CM | POA: Diagnosis not present

## 2022-03-10 NOTE — Progress Notes (Deleted)
[facility]  PATIENT: Eric Case DOB: January 31, 1946  REFERRING CLINICIAN: Alysia PennaHolwerda, Scott, MD HISTORY FROM: self, wife Eric Case REASON FOR VISIT: memory loss, imbalance   HISTORICAL  CHIEF COMPLAINT:  No chief complaint on file.   FOLLOW UP VISIT  Prior visit: 11/09/2021 Dr. Delena Balihima  Brief HPI:  Eric BrassHarvey K Mccrackin is a 76 y.o. male who is being followed by Dr. Delena Balihima for memory loss since 01/2021 after COVID likely in setting of dementia.  MRI brain showed mild to severe atrophy and chronic small vessel disease.  He also noted imbalance with exam showing decreased pinprick and vibration to bilateral feet likely contributing, A1c 6.0, B12 WNL.   Interval history:    Memory has been slowly worsening since that time. Has been struggling with word finding difficulty. For example could not think of the word "refrigerator" recently. Wife will sometimes have to repeat herself in conversations as he struggles to process things as quickly as he used it. Will have to stop and think about the next step when performing tasks (getting cereal, starting the car, using carpenter tools). Earlier this month he used rubbing alcohol for mouthwash.  He has also noticed imbalance since the fall in November. He is still able to walk a hiking trail and on his steep driveway every day without falling. Has always leaned forward while walking, but seems to be leaning forward more recently. Did have a fall a couple of weeks ago, but this was a mechanical fall (feet got tangled in brush). Did not hit his head.   TBI: Larey SeatFell and hit his head in November 2022 Stroke:  no past history of stroke Seizures:  no past history of seizures Sleep: No history of sleep apnea. Snores intermittently at night. No daytime sleepiness Mood:  patient denies anxiety and depression  Functional status:  Patient lives with his wife Cooking: Will take time to remember how to fix hi scereal Driving: Dries to familiar  places wihtout issues. Had one time where he had to sit and think about how to put the car in drive from the park position. Bills: Wife took over for finances after he fell Medications: had some issues so he go ta pillbox Ever left the stove on by accident?: no Forget how to use items around the house?: yes Getting lost going to familiar places?: no Forgetting loved ones names?: no Word finding difficulty? yes  OTHER MEDICAL CONDITIONS: GERD, hypothyroidism   REVIEW OF SYSTEMS: Full 14 system review of systems performed and negative with exception of: memory loss, imbalance  ALLERGIES: Allergies  Allergen Reactions   Ciprofloxacin Other (See Comments)    chest pain   Doxycycline Other (See Comments)   Hydromorphone Itching   Nsaids     Other reaction(s): Unknown   Oxycodone-Acetaminophen Itching    REACTION: itching   Pennsaid [Diclofenac Sodium] Other (See Comments)    "made me feel bad all over" Flu like symptoms/fever    Percocet [Oxycodone-Acetaminophen]     unknown   Sulfamethoxazole-Trimethoprim Rash    septra    HOME MEDICATIONS: Outpatient Medications Prior to Visit  Medication Sig Dispense Refill   Ascorbic Acid (VITAMIN C) 1000 MG tablet Take 1,000 mg by mouth daily.       co-enzyme Q-10 30 MG capsule Take 30 mg by mouth daily.      ferrous sulfate 325 (65 FE) MG tablet Take 325 mg by mouth daily as needed (for iron deficiency related to prevacid).  lansoprazole (PREVACID) 15 MG capsule Take 15 mg by mouth daily.     levothyroxine (SYNTHROID, LEVOTHROID) 112 MCG tablet Take 112 mcg by mouth daily.       Multiple Vitamin (MULTIVITAMIN WITH MINERALS) TABS tablet Take 1 tablet by mouth daily.     Nutritional Supplements (SALMON OIL) CAPS Take 1 capsule by mouth daily.     rivaroxaban (XARELTO) 10 MG TABS tablet Take 1 tablet (10 mg total) by mouth daily for 14 days. 14 tablet 0   Saw Palmetto, Serenoa repens, (SAW PALMETTO PO) Take 250 mg by mouth 2 (two) times  daily.      vitamin E 400 UNIT capsule Take 400 Units by mouth daily.       No facility-administered medications prior to visit.    PAST MEDICAL HISTORY: Past Medical History:  Diagnosis Date   Anemia    on Iron- comes and goes per patient    Arthritis    Cataract    starting but very small- RIGHT REMOVED   Complication of anesthesia    severe headache after esophagus stretched years ago   Diverticulitis    Diverticulosis    GERD (gastroesophageal reflux disease)    Hemorrhoids    Hiatal hernia    Hx of adenomatous colonic polyps    Hypothyroidism    Migraine, unspecified, not intractable, without status migrainosus    Peptic stricture of esophagus     PAST SURGICAL HISTORY: Past Surgical History:  Procedure Laterality Date   ACHILLES TENDON SURGERY Right 03/19/2021   Procedure: Right Achilles tendon reconstruction with autograft FHL transfer to the calcaneus; gastroc recession;  Surgeon: [name], MD;  Location: [facility];  Service: Orthopedics;  Laterality: Right;    CATARACT EXTRACTION Right    COLONOSCOPY     KNEE SURGERY Right    tendon tear    MENISCUS REPAIR Right    2015 and May 2017   POLYPECTOMY     SHOULDER SURGERY     bilateral   TOTAL KNEE ARTHROPLASTY Right 04/02/2016   Procedure: RIGHT TOTAL KNEE ARTHROPLASTY;  Surgeon: Eugenia Mcalpine, MD;  Location: [facility];  Service: Orthopedics;  Laterality: Right;  Adductor Block   UPPER GASTROINTESTINAL ENDOSCOPY      FAMILY HISTORY: Family History  Problem Relation Age of Onset   Breast cancer Mother    Heart disease Mother    Liver cancer Father    Prostate cancer Father    Prostate cancer Maternal Uncle    Prostate cancer Maternal Uncle    Prostate cancer Maternal Uncle    Colon cancer Cousin    Colon polyps Neg Hx    Esophageal cancer Neg Hx    Rectal cancer Neg Hx    Stomach cancer Neg Hx     SOCIAL HISTORY: Social History   Socioeconomic History   Marital status:  Married    Spouse name: Eric Leash   Number of children: 1   Years of education: Not on file   Highest education level: Associate degree: occupational, Scientist, product/process development, or vocational program  Occupational History   Occupation: carpenter-retired  Tobacco Use   Smoking status: Never   Smokeless tobacco: Never  Substance and Sexual Activity   Alcohol use: No    Alcohol/week: 0.0 standard drinks of alcohol   Drug use: No   Sexual activity: Not on file  Other Topics Concern   Not on file  Social History Narrative   11/09/21 Live with wife   Daily caffeine  Social Determinants of Health   Financial Resource Strain: Not on file  Food Insecurity: Not on file  Transportation Needs: Not on file  Physical Activity: Not on file  Stress: Not on file  Social Connections: Not on file  Intimate Partner Violence: Not on file     PHYSICAL EXAM  GENERAL EXAM/CONSTITUTIONAL: Vitals:  There were no vitals filed for this visit.  There is no height or weight on file to calculate BMI. Wt Readings from Last 3 Encounters:  11/09/21 205 lb (93 kg)  03/19/21 199 lb 8.3 oz (90.5 kg)  09/11/20 200 lb (90.7 kg)   NEUROLOGIC: MENTAL STATUS:     11/09/2021    1:26 PM  Montreal Cognitive Assessment   Visuospatial/ Executive (0/5) 2  Naming (0/3) 1  Attention: Read list of digits (0/2) 1  Attention: Read list of letters (0/1) 1  Attention: Serial 7 subtraction starting at 100 (0/3) 2  Language: Repeat phrase (0/2) 1  Language : Fluency (0/1) 0  Abstraction (0/2) 2  Delayed Recall (0/5) 0  Orientation (0/6) 4  Total 14     CRANIAL NERVE:  2nd, 3rd, 4th, 6th - pupils equal and reactive to light, visual fields full to confrontation, extraocular muscles intact, no nystagmus 5th - facial sensation symmetric 7th - facial strength symmetric 8th - hearing intact 9th - palate elevates symmetrically, uvula midline 11th - shoulder shrug symmetric 12th - tongue protrusion midline  MOTOR:  normal bulk  and tone, no cogwheeling, full strength in the BUE, BLE  SENSORY:  Decreased sensation to pinprick left foot, decreased vibration bilateral feet  COORDINATION:  finger-nose-finger, fine finger movements normal, no tremor  REFLEXES:  deep tendon reflexes present and symmetric  GAIT/STATION:  Wide based gait, stooped posture, decreased arm swing on left     DIAGNOSTIC DATA (LABS, IMAGING, TESTING) - I reviewed patient records, labs, notes, testing and imaging myself where available.  Lab Results  Component Value Date   WBC 11.7 (H) 02/17/2021   HGB 14.9 02/17/2021   HCT 46.2 02/17/2021   MCV 85.1 02/17/2021   PLT 276 02/17/2021      Component Value Date/Time   NA 134 (L) 02/17/2021 1014   K 3.9 02/17/2021 1014   CL 103 02/17/2021 1014   CO2 24 02/17/2021 1014   GLUCOSE 119 (H) 02/17/2021 1014   BUN 19 02/17/2021 1014   CREATININE 0.84 02/17/2021 1014   CALCIUM 8.7 (L) 02/17/2021 1014   PROT 7.4 02/17/2021 1014   ALBUMIN 4.3 02/17/2021 1014   AST 28 02/17/2021 1014   ALT 24 02/17/2021 1014   ALKPHOS 57 02/17/2021 1014   BILITOT 0.6 02/17/2021 1014   GFRNONAA >60 02/17/2021 1014   GFRAA >60 04/05/2016 0919   TSH and RPR in August reportedly norma    ASSESSMENT AND PLAN  76 y.o. year old male with a history of GERD and hypothyroidism who presents for evaluation of worsening memory loss and imbalance following COVID infection and a fall in November 2022.  Patient of Dr. Delena Bali.  MRI brain showed moderate to severe atrophy, moderate chronic small vessel ischemic disease and chronic cerebral microhemorrhages.  Based on imaging and memory test, concern for dementia.  Per Dr. Delena Bali at prior visit "Exam also reveals decreased sensation to pinprick and vibration in bilateral feet, which can contribute to imbalance. Will check B12 and A1c levels today.  He does have decreased arm swing and stooped posture (reportedly this is baseline), but currently no other signs  of  parkinsonism today. Will continue to monitor for now."  A1c 6.0, B12 1310   No diagnosis found.     PLAN: - Labs: A1c, B12 - MRI brain - Follow up after testing is complete.   No orders of the defined types were placed in this encounter.    No follow-ups on file.    I spent *** minutes of face-to-face and non-face-to-face time with patient.  This included previsit chart review, lab review, study review, order entry, electronic health record documentation, patient education  Ihor Austin, [facility]  [facility] [address] [place], [place] [address]  Phone [phone] Fax [phone] Note: This document was prepared with digital dictation and possible smart phrase technology. Any transcriptional errors that result from this process are unintentional.

## 2022-03-11 ENCOUNTER — Ambulatory Visit: Payer: Medicare Other | Admitting: Adult Health

## 2022-03-11 DIAGNOSIS — K219 Gastro-esophageal reflux disease without esophagitis: Secondary | ICD-10-CM | POA: Diagnosis not present

## 2022-03-11 DIAGNOSIS — I517 Cardiomegaly: Secondary | ICD-10-CM | POA: Diagnosis not present

## 2022-03-11 DIAGNOSIS — G459 Transient cerebral ischemic attack, unspecified: Secondary | ICD-10-CM | POA: Diagnosis not present

## 2022-03-11 DIAGNOSIS — E039 Hypothyroidism, unspecified: Secondary | ICD-10-CM | POA: Diagnosis not present

## 2022-03-11 NOTE — Progress Notes (Addendum)
[facility]  PATIENT: Eric Case DOB: [birth year]  REFERRING CLINICIAN: Alysia Penna, MD HISTORY FROM: self, wife Eric Case REASON FOR VISIT: memory loss, imbalance   HISTORICAL  CHIEF COMPLAINT:  Chief Complaint  Patient presents with   Room 2    Pt is here with Wife. Pt states his memory hasn't changed. Pt's wife states that he couldn't talk on Wed of next week.     FOLLOW UP VISIT  Prior visit: 11/09/2021 Dr. Delena Bali  Brief HPI:  Eric Case is a 76 y.o. male who is being followed by Dr. Delena Bali for memory loss since 01/2021 after COVID likely in setting of dementia.  MRI brain showed moderate to severe atrophy, chronic small vessel disease and cerebral microhemorrhages felt to be associated with chronic small vessel ischemic disease.  He also noted imbalance with exam showing decreased pinprick and vibration to bilateral feet likely contributing, A1c 6.0, B12 WNL.   Interval history:  Reports cognition has been stable since prior visit without any significant change. MOCA today 14/30 (prior 14/30).   Reports episode on 12/20 of transient worsening aphasia after physical exertion.  Returned to baseline by the time EMS arrived.  Was evaluated at [facility].  Reports MRI brain negative for acute stroke but reports she was told imaging showed amyloid angiopathy.  Was diagnosed with TIA and recommend initiating aspirin, statin and antihypertensives.  Wife questions use of these medications.  Blood pressure slightly elevated today but typically 110-120s/60s-70s.   Continued word finding difficulty but has remained at baseline and nonprogressive since onset over 1 year ago (besides recent transient worsening).  Denies any episodes of LOC, AMS and recurrent transient worsening.  Denies any seizure-like activity.  Continued imbalance and shuffling gait over the past year but still able to hike on trails, denies any recent falls, has been nonprogressive.     ADDENDUM: Received copy from recent [facility] ED visit.  MRI brain wo contrast no acute abnormality, multiple scattered foci of hemosiderin deposition, many of which are peripheral, with a new 11 mm focus of hemosiderin in the right basal ganglia concerning for interval hemorrhage since 11/27/2021 imaging, noted nonspecific but concerning for cerebral amyloid angiopathy  CTA neck negative for hemodynamically significant stenosis with minimal atherosclerotic plaque within the proximal internal carotid arteries bilaterally.  CTA head showed arthrosclerotic irregularity of M2 and more distal MCA vessels bilaterally and mild arthrosclerotic plaque within the right V4 segment with mild stenosis, patent BA, and both PCAs distal branch arthrosclerotic irregularity.   LDL 95.2, A1c 5.6    TBI: [place] and hit his head in November 2022 Stroke:  no past history of stroke Seizures:  no past history of seizures Sleep: No history of sleep apnea. Snores intermittently at night. No daytime sleepiness Mood:  patient denies anxiety and depression  Functional status:  Patient lives with his wife Cooking: Will take time to remember how to fix hi scereal Driving: Dries to familiar places wihtout issues. Had one time where he had to sit and think about how to put the car in drive from the park position. Bills: Wife took over for finances after he fell Medications: had some issues so he go ta pillbox Ever left the stove on by accident?: no Forget how to use items around the house?: yes Getting lost going to familiar places?: no Forgetting loved ones names?: no Word finding difficulty? yes  OTHER MEDICAL CONDITIONS: GERD, hypothyroidism   REVIEW OF SYSTEMS: Full 14 system review  of systems performed and negative with exception of: memory loss, imbalance  ALLERGIES: Allergies  Allergen Reactions   Ciprofloxacin Other (See Comments)    chest pain   Doxycycline Other (See Comments)    Hydromorphone Itching   Nsaids     Other reaction(s): Unknown   Oxycodone-Acetaminophen Itching    REACTION: itching   Pennsaid [Diclofenac Sodium] Other (See Comments)    "made me feel bad all over" Flu like symptoms/fever    Percocet [Oxycodone-Acetaminophen]     unknown   Sulfamethoxazole-Trimethoprim Rash    septra    HOME MEDICATIONS: Outpatient Medications Prior to Visit  Medication Sig Dispense Refill   Ascorbic Acid (VITAMIN C) 1000 MG tablet Take 1,000 mg by mouth daily.       co-enzyme Q-10 30 MG capsule Take 30 mg by mouth daily.      lansoprazole (PREVACID) 15 MG capsule Take 15 mg by mouth daily.     levothyroxine (SYNTHROID, LEVOTHROID) 112 MCG tablet Take 112 mcg by mouth daily.       Multiple Vitamin (MULTIVITAMIN WITH MINERALS) TABS tablet Take 1 tablet by mouth daily.     Nutritional Supplements (SALMON OIL) CAPS Take 1 capsule by mouth daily.     Saw Palmetto, Serenoa repens, (SAW PALMETTO PO) Take 250 mg by mouth 2 (two) times daily.      vitamin E 400 UNIT capsule Take 400 Units by mouth daily.       ferrous sulfate 325 (65 FE) MG tablet Take 325 mg by mouth daily as needed (for iron deficiency related to prevacid). (Patient not taking: Reported on 03/16/2022)     rivaroxaban (XARELTO) 10 MG TABS tablet Take 1 tablet (10 mg total) by mouth daily for 14 days. 14 tablet 0   No facility-administered medications prior to visit.    PAST MEDICAL HISTORY: Past Medical History:  Diagnosis Date   Anemia    on Iron- comes and goes per patient    Arthritis    Cataract    starting but very small- RIGHT REMOVED   Complication of anesthesia    severe headache after esophagus stretched years ago   Diverticulitis    Diverticulosis    GERD (gastroesophageal reflux disease)    Hemorrhoids    Hiatal hernia    Hx of adenomatous colonic polyps    Hypothyroidism    Migraine, unspecified, not intractable, without status migrainosus    Peptic stricture of esophagus      PAST SURGICAL HISTORY: Past Surgical History:  Procedure Laterality Date   ACHILLES TENDON SURGERY Right 03/19/2021   Procedure: Right Achilles tendon reconstruction with autograft FHL transfer to the calcaneus; gastroc recession;  Surgeon: [name], MD;  Location: [facility];  Service: Orthopedics;  Laterality: Right;    CATARACT EXTRACTION Right    COLONOSCOPY     KNEE SURGERY Right    tendon tear    MENISCUS REPAIR Right    2015 and May 2017   POLYPECTOMY     SHOULDER SURGERY     bilateral   TOTAL KNEE ARTHROPLASTY Right 04/02/2016   Procedure: RIGHT TOTAL KNEE ARTHROPLASTY;  Surgeon: Eugenia Mcalpine, MD;  Location: [facility];  Service: Orthopedics;  Laterality: Right;  Adductor Block   UPPER GASTROINTESTINAL ENDOSCOPY      FAMILY HISTORY: Family History  Problem Relation Age of Onset   Breast cancer Mother    Heart disease Mother    Liver cancer Father    Prostate cancer Father  Prostate cancer Maternal Uncle    Prostate cancer Maternal Uncle    Prostate cancer Maternal Uncle    Colon cancer Cousin    Colon polyps Neg Hx    Esophageal cancer Neg Hx    Rectal cancer Neg Hx    Stomach cancer Neg Hx     SOCIAL HISTORY: Social History   Socioeconomic History   Marital status: Married    Spouse name: Eric Case   Number of children: 1   Years of education: Not on file   Highest education level: Associate degree: occupational, Scientist, product/process development, or vocational program  Occupational History   Occupation: carpenter-retired  Tobacco Use   Smoking status: Never   Smokeless tobacco: Never  Substance and Sexual Activity   Alcohol use: No    Alcohol/week: 0.0 standard drinks of alcohol   Drug use: No   Sexual activity: Not on file  Other Topics Concern   Not on file  Social History Narrative   11/09/21 Live with wife   Daily caffeine   Social Determinants of Health   Financial Resource Strain: Not on file  Food Insecurity: Not on file   Transportation Needs: Not on file  Physical Activity: Not on file  Stress: Not on file  Social Connections: Not on file  Intimate Partner Violence: Not on file     PHYSICAL EXAM  GENERAL EXAM/CONSTITUTIONAL: Vitals:  Vitals:   03/16/22 0810  BP: (!) 143/88  Pulse: 74  Weight: 198 lb (89.8 kg)  Height:  (1.753 m)    Body mass index is 29.24 kg/m. Wt Readings from Last 3 Encounters:  03/16/22 198 lb (89.8 kg)  11/09/21 205 lb (93 kg)  03/19/21 199 lb 8.3 oz (90.5 kg)   NEUROLOGIC: MENTAL STATUS: Mild expressive aphasia, difficulty naming objects, follows commands without difficulty.  No evidence of dysarthria.    03/16/2022    8:12 AM 11/09/2021    1:26 PM  Montreal Cognitive Assessment   Visuospatial/ Executive (0/5) 0 2  Naming (0/3) 2 1  Attention: Read list of digits (0/2) 1 1  Attention: Read list of letters (0/1) 1 1  Attention: Serial 7 subtraction starting at 100 (0/3) 1 2  Language: Repeat phrase (0/2) 2 1  Language : Fluency (0/1) 1 0  Abstraction (0/2) 2 2  Delayed Recall (0/5) 0 0  Orientation (0/6) 4 4  Total 14 14  Adjusted Score (based on education) 14      CRANIAL NERVE:  2nd, 3rd, 4th, 6th - pupils equal and reactive to light, visual fields full to confrontation, extraocular muscles intact, no nystagmus 5th - facial sensation symmetric 7th - facial strength symmetric 8th - hearing intact 9th - palate elevates symmetrically, uvula midline 11th - shoulder shrug symmetric 12th - tongue protrusion midline  MOTOR:  normal bulk and tone, no cogwheeling, full strength in the BUE, BLE, no evidence of action or resting tremor  SENSORY:  Decreased sensation to pinprick left foot, decreased vibration bilateral feet  COORDINATION:  finger-nose-finger, fine finger movements normal, no tremor  REFLEXES:  deep tendon reflexes present and symmetric  GAIT/STATION:  Wide based gait, stooped posture, decreased arm swing on left, unable to  appreciate shuffling gait today     DIAGNOSTIC DATA (LABS, IMAGING, TESTING) - I reviewed patient records, labs, notes, testing and imaging myself where available.  MRI brain w/wo contrast 11/27/2021 IMPRESSION: MRI brain with and without contrast demonstrating: - Moderate to severe atrophy with ventriculomegaly on ex vacuo basis.   -  Moderate chronic small vessel ischemic disease.  Approximately 5 chronic cerebral microhemorrhages noted, can also be associated with chronic small vessel ischemic disease. - No acute findings.    Lab Results  Component Value Date   WBC 11.7 (H) 02/17/2021   HGB 14.9 02/17/2021   HCT 46.2 02/17/2021   MCV 85.1 02/17/2021   PLT 276 02/17/2021      Component Value Date/Time   NA 134 (L) 02/17/2021 1014   K 3.9 02/17/2021 1014   CL 103 02/17/2021 1014   CO2 24 02/17/2021 1014   GLUCOSE 119 (H) 02/17/2021 1014   BUN 19 02/17/2021 1014   CREATININE 0.84 02/17/2021 1014   CALCIUM 8.7 (L) 02/17/2021 1014   PROT 7.4 02/17/2021 1014   ALBUMIN 4.3 02/17/2021 1014   AST 28 02/17/2021 1014   ALT 24 02/17/2021 1014   ALKPHOS 57 02/17/2021 1014   BILITOT 0.6 02/17/2021 1014   GFRNONAA >60 02/17/2021 1014   GFRAA >60 04/05/2016 0919   TSH and RPR in August reportedly normal    ASSESSMENT AND PLAN  76 y.o. year old male with a history of GERD and hypothyroidism who presents for evaluation of worsening memory loss and imbalance following COVID infection and a fall in November 2022.  Patient of Dr. Delena Balihima.  MRI brain showed moderate to severe atrophy, moderate chronic small vessel ischemic disease and chronic cerebral microhemorrhages.  Based on imaging and memory test, concern for dementia.  Per Dr. Delena Balihima at prior visit "Exam also reveals decreased sensation to pinprick and vibration in bilateral feet, which can contribute to imbalance. Will check B12 and A1c levels today.  He does have decreased arm swing and stooped posture (reportedly this is  baseline), but currently no other signs of parkinsonism today. Will continue to monitor for now."  A1c 6.0, B12 1310. Was seen at [facility] ED on 12/20 for transient aphasia, was dx'd with TIA and recommended use of aspirin and statin   1. Dementia without behavioral disturbance, psychotic disturbance, mood disturbance, or anxiety, unspecified dementia severity, unspecified dementia type (HCC)   2. Imbalance   3. Aphasia      PLAN: - will obtain records from recent [facility] ED visit for further review - due to possible TIA, recommend continued use of aspirin as recommended, discussed indication for statin for secondary stroke prevention measures, wife hesitant on statin therapy due to side effects, will further review imaging reports and recent LDL level and will further provide guidance at that time -Recommend consideration of neurocognitive evaluation to further clarify type of dementia, will further consider -Recommend participating with PT and SLP, will further consider -Declines interest in medications for memory such as Aricept or Namenda at this time due to potential side effects  ADDENDUM: Reviewed notes from [facility] ER visit. Due to concern of TIA, evidence of intracranial arthrosclerosis on CTA head and LDL 95.2, would recommend initiating statin as recommended by [facility] provider, can further discuss with PCP at follow up visit coming up. Evidence of new right basal ganglial hemosiderin since prior imaging in 11/2021 raising suspicion for cerebral amyloid angiopathy. Contacted wife to review this information. She is requesting referral be placed to neuropsychology for neurocognitive evaluation, referral will be placed to [facility]. No other questions or concerns at this time.     Return in about 4 months (around 07/16/2022) for 3-4 months with Dr. Delena Balihima .    I spent 37 minutes of face-to-face and non-face-to-face time with patient  and  wife.  This included previsit chart review, lab review, study review, order entry, electronic health record documentation, patient and wife education and discussion regarding above diagnoses and treatment plan and answered all the questions to patient's satisfaction  Ihor Austin, [facility]  [facility] [address] [place], [place] [address]  Phone [phone] Fax [phone] Note: This document was prepared with digital dictation and possible smart phrase technology. Any transcriptional errors that result from this process are unintentional.

## 2022-03-16 ENCOUNTER — Telehealth: Payer: Self-pay

## 2022-03-16 ENCOUNTER — Telehealth: Payer: Self-pay | Admitting: Adult Health

## 2022-03-16 ENCOUNTER — Encounter: Payer: Self-pay | Admitting: Adult Health

## 2022-03-16 ENCOUNTER — Ambulatory Visit: Payer: Medicare Other | Admitting: Adult Health

## 2022-03-16 VITALS — BP 143/88 | HR 74 | Ht 69.0 in | Wt 198.0 lb

## 2022-03-16 DIAGNOSIS — F0390 Unspecified dementia without behavioral disturbance: Secondary | ICD-10-CM

## 2022-03-16 DIAGNOSIS — R4701 Aphasia: Secondary | ICD-10-CM | POA: Diagnosis not present

## 2022-03-16 DIAGNOSIS — R2689 Other abnormalities of gait and mobility: Secondary | ICD-10-CM

## 2022-03-16 NOTE — Patient Instructions (Addendum)
Would recommend continued use of aspirin - will review recent report from [facility] and provide further guidance on cholesterol medication  Would recommend considering working with physical and speech therapy - please let me know if you are interested in pursuing   Would recommend further evaluation with a neuropsychologist for neurocognitive testing     Follow up in 3-4 months with Dr. Delena Bali

## 2022-03-16 NOTE — Telephone Encounter (Signed)
Referral for Neuropsychology fax to [facility]. Phone: [phone], Fax: [phone] 

## 2022-03-16 NOTE — Addendum Note (Signed)
Addended by: Ihor Austin L on: 03/16/2022 02:46 PM   Modules accepted: Orders

## 2022-03-16 NOTE — Telephone Encounter (Signed)
Routed message to MR to get visit info from [facility] visit on 12.20.23

## 2022-03-17 DIAGNOSIS — R4701 Aphasia: Secondary | ICD-10-CM | POA: Diagnosis not present

## 2022-03-17 DIAGNOSIS — R030 Elevated blood-pressure reading, without diagnosis of hypertension: Secondary | ICD-10-CM | POA: Diagnosis not present

## 2022-03-17 DIAGNOSIS — G459 Transient cerebral ischemic attack, unspecified: Secondary | ICD-10-CM | POA: Diagnosis not present

## 2022-03-17 DIAGNOSIS — F0390 Unspecified dementia without behavioral disturbance: Secondary | ICD-10-CM | POA: Diagnosis not present

## 2022-03-23 ENCOUNTER — Telehealth: Payer: Self-pay | Admitting: Internal Medicine

## 2022-03-23 NOTE — Telephone Encounter (Signed)
Pts wife states they had talked to pts PCP and they were told he could alternate pepcid with prevacid and try to come off of the prevacid. He developed the hiccups and had acid come back up in his throat. They were concerned because he saw something black. Discussed with them it could have been something he ate or wife stated it may have been in the sink prior to him spitting up. Discussed with them to monitor for now and let us know if this happens again. If he developed any SOB, dizziness, increased fatigue to let us know. She verbalized understanding.

## 2022-03-23 NOTE — Telephone Encounter (Signed)
Patient wife is calling. Tried to come off lansoprazole and burped acid up with black fluid and is concerned. Please advise.

## 2022-03-24 NOTE — Telephone Encounter (Signed)
Received a fax that multiple attempts were made to contact the patient to schedule and he has not called them back.

## 2022-04-06 DIAGNOSIS — K080 Exfoliation of teeth due to systemic causes: Secondary | ICD-10-CM | POA: Diagnosis not present

## 2022-05-13 ENCOUNTER — Encounter (HOSPITAL_COMMUNITY): Payer: Self-pay

## 2022-05-13 ENCOUNTER — Other Ambulatory Visit: Payer: Self-pay

## 2022-05-13 ENCOUNTER — Emergency Department (HOSPITAL_COMMUNITY): Payer: Medicare Other

## 2022-05-13 ENCOUNTER — Observation Stay (HOSPITAL_COMMUNITY)
Admission: EM | Admit: 2022-05-13 | Discharge: 2022-05-14 | Disposition: A | Discharge status: Home / Self Care | Payer: Medicare Other | Attending: Family Medicine | Admitting: Family Medicine

## 2022-05-13 DIAGNOSIS — Z7901 Long term (current) use of anticoagulants: Secondary | ICD-10-CM | POA: Diagnosis not present

## 2022-05-13 DIAGNOSIS — Z79899 Other long term (current) drug therapy: Secondary | ICD-10-CM | POA: Insufficient documentation

## 2022-05-13 DIAGNOSIS — Z1152 Encounter for screening for COVID-19: Secondary | ICD-10-CM | POA: Insufficient documentation

## 2022-05-13 DIAGNOSIS — R55 Syncope and collapse: Principal | ICD-10-CM | POA: Insufficient documentation

## 2022-05-13 DIAGNOSIS — E039 Hypothyroidism, unspecified: Secondary | ICD-10-CM | POA: Insufficient documentation

## 2022-05-13 DIAGNOSIS — Z8673 Personal history of transient ischemic attack (TIA), and cerebral infarction without residual deficits: Secondary | ICD-10-CM | POA: Insufficient documentation

## 2022-05-13 DIAGNOSIS — Z96651 Presence of right artificial knee joint: Secondary | ICD-10-CM | POA: Diagnosis not present

## 2022-05-13 DIAGNOSIS — K219 Gastro-esophageal reflux disease without esophagitis: Secondary | ICD-10-CM | POA: Diagnosis present

## 2022-05-13 DIAGNOSIS — R410 Disorientation, unspecified: Secondary | ICD-10-CM | POA: Diagnosis not present

## 2022-05-13 DIAGNOSIS — I480 Paroxysmal atrial fibrillation: Secondary | ICD-10-CM | POA: Insufficient documentation

## 2022-05-13 DIAGNOSIS — R42 Dizziness and giddiness: Secondary | ICD-10-CM | POA: Diagnosis not present

## 2022-05-13 DIAGNOSIS — I491 Atrial premature depolarization: Secondary | ICD-10-CM | POA: Diagnosis not present

## 2022-05-13 LAB — I-STAT VENOUS BLOOD GAS, ED
Acid-base deficit: 4 mmol/L — ABNORMAL HIGH (ref 0.0–2.0)
Bicarbonate: 21.6 mmol/L (ref 20.0–28.0)
Calcium, Ion: 1.09 mmol/L — ABNORMAL LOW (ref 1.15–1.40)
HCT: 47 % (ref 39.0–52.0)
Hemoglobin: 16 g/dL (ref 13.0–17.0)
O2 Saturation: 99 %
Potassium: 4.2 mmol/L (ref 3.5–5.1)
Sodium: 135 mmol/L (ref 135–145)
TCO2: 23 mmol/L (ref 22–32)
pCO2, Ven: 39.7 mmHg — ABNORMAL LOW (ref 44–60)
pH, Ven: 7.343 (ref 7.25–7.43)
pO2, Ven: 131 mmHg — ABNORMAL HIGH (ref 32–45)

## 2022-05-13 LAB — CBC WITH DIFFERENTIAL/PLATELET
Abs Immature Granulocytes: 0.04 10*3/uL (ref 0.00–0.07)
Basophils Absolute: 0.1 10*3/uL (ref 0.0–0.1)
Basophils Relative: 1 %
Eosinophils Absolute: 0.1 10*3/uL (ref 0.0–0.5)
Eosinophils Relative: 1 %
HCT: 46.6 % (ref 39.0–52.0)
Hemoglobin: 15.1 g/dL (ref 13.0–17.0)
Immature Granulocytes: 0 %
Lymphocytes Relative: 9 %
Lymphs Abs: 0.8 10*3/uL (ref 0.7–4.0)
MCH: 28.7 pg (ref 26.0–34.0)
MCHC: 32.4 g/dL (ref 30.0–36.0)
MCV: 88.6 fL (ref 80.0–100.0)
Monocytes Absolute: 0.8 10*3/uL (ref 0.1–1.0)
Monocytes Relative: 8 %
Neutro Abs: 7.7 10*3/uL (ref 1.7–7.7)
Neutrophils Relative %: 81 %
Platelets: 248 10*3/uL (ref 150–400)
RBC: 5.26 MIL/uL (ref 4.22–5.81)
RDW: 14 % (ref 11.5–15.5)
WBC: 9.6 10*3/uL (ref 4.0–10.5)
nRBC: 0 % (ref 0.0–0.2)

## 2022-05-13 LAB — BLOOD GAS, VENOUS
Acid-Base Excess: 2.8 mmol/L — ABNORMAL HIGH (ref 0.0–2.0)
Bicarbonate: 30.1 mmol/L — ABNORMAL HIGH (ref 20.0–28.0)
O2 Saturation: 20.4 %
Patient temperature: 36.5
pCO2, Ven: 56 mmHg (ref 44–60)
pH, Ven: 7.34 (ref 7.25–7.43)
pO2, Ven: <31 mmHg — CL (ref 32–45)

## 2022-05-13 LAB — BASIC METABOLIC PANEL
Anion gap: 10 (ref 5–15)
BUN: 16 mg/dL (ref 8–23)
CO2: 22 mmol/L (ref 22–32)
Calcium: 8.7 mg/dL — ABNORMAL LOW (ref 8.9–10.3)
Chloride: 104 mmol/L (ref 98–111)
Creatinine, Ser: 1.04 mg/dL (ref 0.61–1.24)
GFR, Estimated: >60 mL/min (ref >60)
Glucose, Bld: 168 mg/dL — ABNORMAL HIGH (ref 70–99)
Potassium: 3.7 mmol/L (ref 3.5–5.1)
Sodium: 136 mmol/L (ref 135–145)

## 2022-05-13 LAB — URINALYSIS, ROUTINE W REFLEX MICROSCOPIC
Bacteria, UA: NONE SEEN
Bilirubin Urine: NEGATIVE
Glucose, UA: NEGATIVE mg/dL
Ketones, ur: NEGATIVE mg/dL
Leukocytes,Ua: NEGATIVE
Nitrite: NEGATIVE
Protein, ur: NEGATIVE mg/dL
Specific Gravity, Urine: 1.016 (ref 1.005–1.030)
pH: 5 (ref 5.0–8.0)

## 2022-05-13 LAB — TROPONIN I (HIGH SENSITIVITY)
Troponin I (High Sensitivity): 5 ng/L (ref <18)
Troponin I (High Sensitivity): 5 ng/L (ref <18)

## 2022-05-13 LAB — RESP PANEL BY RT-PCR (RSV, FLU A&B, COVID)  RVPGX2
Influenza A by PCR: NEGATIVE
Influenza B by PCR: NEGATIVE
Resp Syncytial Virus by PCR: NEGATIVE
SARS Coronavirus 2 by RT PCR: NEGATIVE

## 2022-05-13 MED ORDER — DM-GUAIFENESIN ER 30-600 MG PO TB12
1.0000 | ORAL_TABLET | Freq: Two times a day (BID) | ORAL | Status: DC
  Administered 2022-05-13 – 2022-05-14 (×3): 1 via ORAL
  Filled 2022-05-13 (×5): qty 1

## 2022-05-13 MED ORDER — PANTOPRAZOLE SODIUM 20 MG PO TBEC
20.0000 mg | DELAYED_RELEASE_TABLET | Freq: Every day | ORAL | Status: DC
  Administered 2022-05-13: 20 mg via ORAL
  Filled 2022-05-13 (×2): qty 1

## 2022-05-13 MED ORDER — LEVOTHYROXINE SODIUM 112 MCG PO TABS
112.0000 ug | ORAL_TABLET | Freq: Every day | ORAL | Status: DC
  Administered 2022-05-13: 112 ug via ORAL
  Filled 2022-05-13: qty 1

## 2022-05-13 MED ORDER — SODIUM CHLORIDE 0.9% FLUSH
3.0000 mL | Freq: Two times a day (BID) | INTRAVENOUS | Status: DC
  Administered 2022-05-13 (×2): 3 mL via INTRAVENOUS

## 2022-05-13 MED ORDER — FERROUS SULFATE 325 (65 FE) MG PO TABS: 325.0000 mg | ORAL_TABLET | Freq: Every day | ORAL | Status: DC | PRN

## 2022-05-13 MED ORDER — ENOXAPARIN SODIUM 40 MG/0.4ML IJ SOSY: 40.0000 mg | PREFILLED_SYRINGE | INTRAMUSCULAR | Status: DC

## 2022-05-13 MED ORDER — SODIUM CHLORIDE 0.9 % IV BOLUS
1000.0000 mL | Freq: Once | INTRAVENOUS | Status: AC
  Administered 2022-05-13: 1000 mL via INTRAVENOUS

## 2022-05-13 NOTE — H&P (Signed)
History and Physical    [name] [id number] DOB: 1945-12-24 DOA: 05/13/2022  PCP: Velna Hatchet, MD (Confirm with patient/family/NH records and if not entered, this has to be entered at [facility] point of entry) Patient coming from: Home  I have personally briefly reviewed patient's old medical records in [place]  Chief Complaint: I fell  HPI: Eric Case is a 77 y.o. male with medical history significant of hypothyroidism, GERD, stroke, PAF off anticoagulation, presented with near syncope.  Patient started to have dry cough 2 days ago, and running low-grade fever 100.02 nights ago.  Today, this morning while walking in the park, patient complained about feeling dizziness about a fall and had to hold wife, and wife was able to catch him and lowered him to the floor and patient did not pass out but wife did not report the patient looked pale and cold and clammy.  When EMS arrived, patient had another episode of near syncope, when he complained about feeling lightheaded again and EMS checked his blood pressure SBP in the 60s.  En route to hospital, patient's blood pressure normalized without any intervention.  Last 2 days, patient was taking Tessalon with no significant improvement of the dry cough and but this morning, before the near syncope episode patient was not coughing much.  Denied any urinary symptoms no diarrhea no pain.  2 months ago patient was hospitalized to [facility] for stroke, and was told he had " bleeding inside the brain".  He had a MRI of the brain July 2023 showed 5 foci of microhemorrhage in the brain.  His echo cardiogram in December showed concentric hypertrophy of left ventricle.  And he does not take any BP meds.  Patient also has history of remote A-fib was briefly on Xarelto but discontinued and he does not recall what was the reason. ED Course: Afebrile, none tachycardia nonhypotensive nonhypoxic.  CT head showed negative of acute findings or  hemorrhage.  Blood work showed hemoglobin 15, creatinine 1.0, glucose 168, COVID-negative.  UA showed no signs of UTI.  Chest x-ray clear no acute infiltrates  Review of Systems: As per HPI otherwise 14 point review of systems negative.    Past Medical History:  Diagnosis Date   Anemia    on Iron- comes and goes per patient    Arthritis    Cataract    starting but very small- RIGHT REMOVED   Complication of anesthesia    severe headache after esophagus stretched years ago   Diverticulitis    Diverticulosis    GERD (gastroesophageal reflux disease)    Hemorrhoids    Hiatal hernia    Hx of adenomatous colonic polyps    Hypothyroidism    Migraine, unspecified, not intractable, without status migrainosus    Peptic stricture of esophagus     Past Surgical History:  Procedure Laterality Date   ACHILLES TENDON SURGERY Right 03/19/2021   Procedure: Right Achilles tendon reconstruction with autograft FHL transfer to the calcaneus; gastroc recession;  Surgeon: Wylene Simmer, MD;  Location: [facility];  Service: Orthopedics;  Laterality: Right;  1109mn   CATARACT EXTRACTION Right    COLONOSCOPY     KNEE SURGERY Right    tendon tear    MENISCUS REPAIR Right    2015 and May 2017   POLYPECTOMY     SHOULDER SURGERY     bilateral   TOTAL KNEE ARTHROPLASTY Right 04/02/2016   Procedure: RIGHT TOTAL KNEE ARTHROPLASTY;  Surgeon: RHerbie Baltimore  Theda Sers, MD;  Location: [facility];  Service: Orthopedics;  Laterality: Right;  Adductor Block   UPPER GASTROINTESTINAL ENDOSCOPY       reports that he has never smoked. He has never used smokeless tobacco. He reports that he does not drink alcohol and does not use drugs.  Allergies  Allergen Reactions   Ciprofloxacin Other (See Comments)    chest pain   Doxycycline Other (See Comments)   Hydromorphone Itching   Nsaids     Other reaction(s): Unknown   Oxycodone-Acetaminophen Itching    REACTION: itching   Pennsaid [Diclofenac Sodium] Other  (See Comments)    "made me feel bad all over" Flu like symptoms/fever    Percocet [Oxycodone-Acetaminophen]     unknown   Sulfamethoxazole-Trimethoprim Rash    septra    Family History  Problem Relation Age of Onset   Breast cancer Mother    Heart disease Mother    Liver cancer Father    Prostate cancer Father    Prostate cancer Maternal Uncle    Prostate cancer Maternal Uncle    Prostate cancer Maternal Uncle    Colon cancer Cousin    Colon polyps Neg Hx    Esophageal cancer Neg Hx    Rectal cancer Neg Hx    Stomach cancer Neg Hx      Prior to Admission medications   Medication Sig Start Date End Date Taking? Authorizing Provider  Ascorbic Acid (VITAMIN C) 1000 MG tablet Take 1,000 mg by mouth daily.      [provider]  co-enzyme Q-10 30 MG capsule Take 30 mg by mouth daily.     [provider]  ferrous sulfate 325 (65 FE) MG tablet Take 325 mg by mouth daily as needed (for iron deficiency related to prevacid). Patient not taking: Reported on 03/16/2022    [provider]  lansoprazole (PREVACID) 15 MG capsule Take 15 mg by mouth daily. 07/07/20   [provider]  levothyroxine (SYNTHROID, LEVOTHROID) 112 MCG tablet Take 112 mcg by mouth daily.      [provider]  Multiple Vitamin (MULTIVITAMIN WITH MINERALS) TABS tablet Take 1 tablet by mouth daily.    [provider]  Nutritional Supplements (SALMON OIL) CAPS Take 1 capsule by mouth daily.    [provider]  rivaroxaban (XARELTO) 10 MG TABS tablet Take 1 tablet (10 mg total) by mouth daily for 14 days. 03/20/21 04/03/21  Wylene Simmer, MD  Saw Palmetto, Serenoa repens, (SAW PALMETTO PO) Take 250 mg by mouth 2 (two) times daily.     [provider]  vitamin E 400 UNIT capsule Take 400 Units by mouth daily.      [provider]    Physical Exam: Vitals:   05/13/22 1230 05/13/22 1245 05/13/22 1300 05/13/22 1410  BP: (!) 140/83 (!) 140/82  131/82   Pulse: 84 87 90   Resp: (!) 22 (!) 21 17   Temp:    98 F (36.7 C)  TempSrc:      SpO2: 96% 97% 95%   Weight:      Height:        Constitutional: NAD, calm, comfortable Vitals:   05/13/22 1230 05/13/22 1245 05/13/22 1300 05/13/22 1410  BP: (!) 140/83 (!) 140/82 131/82   Pulse: 84 87 90   Resp: (!) 22 (!) 21 17   Temp:    98 F (36.7 C)  TempSrc:      SpO2: 96% 97% 95%   Weight:  Height:       Eyes: PERRL, lids and conjunctivae normal ENMT: Mucous membranes are moist. Posterior pharynx clear of any exudate or lesions.Normal dentition.  Neck: normal, supple, no masses, no thyromegaly.  Carotid bruits heard on right side of the neck Respiratory: clear to auscultation bilaterally, no wheezing, no crackles. Normal respiratory effort. No accessory muscle use.  Cardiovascular: Regular rate and rhythm, no murmurs / rubs / gallops. No extremity edema. 2+ pedal pulses. No carotid bruits.  Abdomen: no tenderness, no masses palpated. No hepatosplenomegaly. Bowel sounds positive.  Musculoskeletal: no clubbing / cyanosis. No joint deformity upper and lower extremities. Good ROM, no contractures. Normal muscle tone.  Skin: no rashes, lesions, ulcers. No induration Neurologic: CN 2-12 grossly intact. Sensation intact, DTR normal. Strength 5/5 in all 4.  Psychiatric: Normal judgment and insight. Alert and oriented x 3. Normal mood.     Labs on Admission: I have personally reviewed following labs and imaging studies  CBC: Recent Labs  Lab 05/13/22 1015  WBC 9.6  NEUTROABS 7.7  HGB 15.1  HCT 46.6  MCV 88.6  PLT [id number]   Basic Metabolic Panel: Recent Labs  Lab 05/13/22 1015  NA 136  K 3.7  CL 104  CO2 22  GLUCOSE 168*  BUN 16  CREATININE 1.04  CALCIUM 8.7*   GFR: Estimated Creatinine Clearance: 68.1 mL/min (by C-G formula based on SCr of 1.04 mg/dL). Liver Function Tests: No results for input(s): "AST", "ALT", "ALKPHOS", "BILITOT", "PROT", "ALBUMIN" in the last  168 hours. No results for input(s): "LIPASE", "AMYLASE" in the last 168 hours. No results for input(s): "AMMONIA" in the last 168 hours. Coagulation Profile: No results for input(s): "INR", "PROTIME" in the last 168 hours. Cardiac Enzymes: No results for input(s): "CKTOTAL", "CKMB", "CKMBINDEX", "TROPONINI" in the last 168 hours. BNP (last 3 results) No results for input(s): "PROBNP" in the last 8760 hours. HbA1C: No results for input(s): "HGBA1C" in the last 72 hours. CBG: No results for input(s): "GLUCAP" in the last 168 hours. Lipid Profile: No results for input(s): "CHOL", "HDL", "LDLCALC", "TRIG", "CHOLHDL", "LDLDIRECT" in the last 72 hours. Thyroid Function Tests: No results for input(s): "TSH", "T4TOTAL", "FREET4", "T3FREE", "THYROIDAB" in the last 72 hours. Anemia Panel: No results for input(s): "VITAMINB12", "FOLATE", "FERRITIN", "TIBC", "IRON", "RETICCTPCT" in the last 72 hours. Urine analysis:    Component Value Date/Time   COLORURINE YELLOW 05/13/2022 1240   APPEARANCEUR HAZY (A) 05/13/2022 1240   LABSPEC 1.016 05/13/2022 1240   PHURINE 5.0 05/13/2022 1240   GLUCOSEU NEGATIVE 05/13/2022 1240   HGBUR SMALL (A) 05/13/2022 1240   BILIRUBINUR NEGATIVE 05/13/2022 1240   KETONESUR NEGATIVE 05/13/2022 1240   PROTEINUR NEGATIVE 05/13/2022 1240   NITRITE NEGATIVE 05/13/2022 1240   LEUKOCYTESUR NEGATIVE 05/13/2022 1240    Radiological Exams on Admission: CT Head Wo Contrast  Result Date: 05/13/2022 CLINICAL DATA:  Syncope EXAM: CT HEAD WITHOUT CONTRAST TECHNIQUE: Contiguous axial images were obtained from the base of the skull through the vertex without intravenous contrast. RADIATION DOSE REDUCTION: This exam was performed according to the departmental dose-optimization program which includes automated exposure control, adjustment of the mA and/or kV according to patient size and/or use of iterative reconstruction technique. COMPARISON:  MRI Brain 03/10/22 FINDINGS: Brain:  Unchanged size and shape of the ventricular system. Redemonstrated extensive periventricular hypodensity, favored to represent sequela of chronic microvascular ischemic change. No evidence of acute infarction, hemorrhage, extra-axial collection or mass lesion/mass effect. Vascular: No disproportionately hyperdense vessel or unexpected calcification. Skull:  Normal. Negative for fracture or focal lesion. Sinuses/Orbits: No mastoid or middle ear effusion. Pansinus mucosal thickening without evidence of acute or chronic sinusitis. Right lens replacement. Orbits are otherwise unremarkable. Other: None. IMPRESSION: 1. No hemorrhage or CT evidence of an acute infarct. 2. Unchanged size and shape of the ventricular system 3. Redemonstrated extensive periventricular hypodensity, favored to represent sequela of chronic microvascular ischemic change. Electronically Signed   By: Marin Roberts M.D.   On: 05/13/2022 14:44   DG Chest Portable 1 View  Result Date: 05/13/2022 CLINICAL DATA:  Near-syncope EXAM: PORTABLE CHEST 1 VIEW COMPARISON:  CXR 02/17/21 FINDINGS: No pleural effusion. No pneumothorax. Low lung volumes. No focal airspace opacity. Normal cardiac and mediastinal contours. No radiographically apparent displaced rib fractures. Visualized upper abdomen is unremarkable. IMPRESSION: Low lung volumes. No acute cardiopulmonary process. Electronically Signed   By: Marin Roberts M.D.   On: 05/13/2022 12:36    EKG: Independently reviewed.  Sinus rhythm, no acute PR or QTc interval changes.  Assessment/Plan Principal Problem:   Syncope Active Problems:   GERD   Syncope, vasovagal  (please populate well all problems here in Problem List. (For example, if patient is on BP meds at home and you resume or decide to hold them, it is a problem that needs to be her. Same for CAD, COPD, HLD and so on)  Near syncope -Suspect vasovagal given the new onset of cough and blood pressure dropped in by Dr. Near-syncope episode  witnessed by EMS. -Other DDx, given there is a history of stroke and right-sided carotid bruits, will check carotid Doppler. -Echo was done less than 2 months ago at [facility] and report appears to be benign, will not repeat echo at this time. -Check orthostatic vital signs, telemonitoring for 24 hours, likely can go home tomorrow.  Hx of hemorrhagic stroke -5 chronic cerebral microhemorrhage foci on brain MRI in September 2023.  Given the patient also has significant concentric LVH send echo, recommend outpatient follow-up with cardiology for ambulatory BP monitoring to consider initiate BP meds  Hypothyroidism, GERD -Stable, continue home meds  DVT prophylaxis: Lovenox Code Status: Full code Family Communication: Wife at bedside Disposition Plan: Expect less than 2 midnight hospital stay Consults called: None Admission status: Tele obs   Lequita Halt MD [facility] Pager [phone]  05/13/2022, 3:50 PM

## 2022-05-13 NOTE — Progress Notes (Signed)
Patient refused Lovenox, will D/C lovenox and start SCD

## 2022-05-13 NOTE — ED Notes (Signed)
ED TO INPATIENT HANDOFF REPORT  ED Nurse Name and Phone #:   S Name/Age/Gender Loni Muse 77 y.o. male Room/Bed: 015C/015C  Code Status   Code Status: Full Code  Home/SNF/Other Home Patient oriented to: self, place, time, and situation Is this baseline? Yes   Triage Complete: Triage complete  Chief Complaint Syncope [R55]  Triage Note Pt BIB [facility] from home d/t near syncopal episode x2, wife reports he began walking fast & shuffling before he sat on the ground & called EMS. On scene his initial BP was 68/40, A/Ox4, appeared pale & fel confused. 2 PIVs started & on the truck his pressure was 112/62. NSR, occasional PVC's, 80 bpm, 92% on RA was given 2L O2 via n/c, CBG 165. No fall, not on thinners, Hx of TIA.   Allergies Allergies  Allergen Reactions   Ciprofloxacin Other (See Comments)    chest pain   Doxycycline Other (See Comments)   Hydromorphone Itching   Nsaids     Other reaction(s): Unknown   Oxycodone-Acetaminophen Itching    REACTION: itching   Pennsaid [Diclofenac Sodium] Other (See Comments)    "made me feel bad all over" Flu like symptoms/fever    Percocet [Oxycodone-Acetaminophen]     unknown   Sulfamethoxazole-Trimethoprim Rash    septra    Level of Care/Admitting Diagnosis ED Disposition     ED Disposition  Admit   Condition  --   [place]: [facility] [[id number]]  Level of Care: Telemetry Medical [104]  May place patient in observation at [facility] or [place] if equivalent level of care is available:: No  Covid Evaluation: Confirmed COVID Negative  Diagnosis: Syncope [206001]  Admitting Physician: Lequita Halt [id number]  Attending Physician: Lequita Halt [id number]          B Medical/Surgery History Past Medical History:  Diagnosis Date   Anemia    on Iron- comes and goes per patient    Arthritis    Cataract    starting but very small- RIGHT REMOVED   Complication of anesthesia     severe headache after esophagus stretched years ago   Diverticulitis    Diverticulosis    GERD (gastroesophageal reflux disease)    Hemorrhoids    Hiatal hernia    Hx of adenomatous colonic polyps    Hypothyroidism    Migraine, unspecified, not intractable, without status migrainosus    Peptic stricture of esophagus    Past Surgical History:  Procedure Laterality Date   ACHILLES TENDON SURGERY Right 03/19/2021   Procedure: Right Achilles tendon reconstruction with autograft FHL transfer to the calcaneus; gastroc recession;  Surgeon: Wylene Simmer, MD;  Location: [facility];  Service: Orthopedics;  Laterality: Right;  169mn   CATARACT EXTRACTION Right    COLONOSCOPY     KNEE SURGERY Right    tendon tear    MENISCUS REPAIR Right    2015 and May 2017   POLYPECTOMY     SHOULDER SURGERY     bilateral   TOTAL KNEE ARTHROPLASTY Right 04/02/2016   Procedure: RIGHT TOTAL KNEE ARTHROPLASTY;  Surgeon: RSydnee Cabal MD;  Location: [facility];  Service: Orthopedics;  Laterality: Right;  Adductor Block   UPPER GASTROINTESTINAL ENDOSCOPY       A IV Location/Drains/Wounds Patient Lines/Drains/Airways Status     Active Line/Drains/Airways     Name Placement date Placement time Site Days   Peripheral IV 05/13/22 16 G Right Antecubital 05/13/22  1026  Antecubital  less than 1   Peripheral IV 05/13/22 18 G Anterior;Right Forearm 05/13/22  1026  Forearm  less than 1   Incision (Closed) 04/02/16 Knee Right 04/02/16  1112  -- 2232   Incision (Closed) 03/19/21 Ankle Right 03/19/21  1416  -- 420            Intake/Output Last 24 hours No intake or output data in the 24 hours ending 05/13/22 1608  Labs/Imaging Results for orders placed or performed during the hospital encounter of 05/13/22 (from the past 48 hour(s))  CBC with Differential     Status: None   Collection Time: 05/13/22 10:15 AM  Result Value Ref Range   WBC 9.6 4.0 - 10.5 K/uL   RBC 5.26 4.22 - 5.81 MIL/uL    Hemoglobin 15.1 13.0 - 17.0 g/dL   HCT 46.6 39.0 - 52.0 %   MCV 88.6 80.0 - 100.0 fL   MCH 28.7 26.0 - 34.0 pg   MCHC 32.4 30.0 - 36.0 g/dL   RDW 14.0 11.5 - 15.5 %   Platelets 248 150 - 400 K/uL   nRBC 0.0 0.0 - 0.2 %   Neutrophils Relative % 81 %   Neutro Abs 7.7 1.7 - 7.7 K/uL   Lymphocytes Relative 9 %   Lymphs Abs 0.8 0.7 - 4.0 K/uL   Monocytes Relative 8 %   Monocytes Absolute 0.8 0.1 - 1.0 K/uL   Eosinophils Relative 1 %   Eosinophils Absolute 0.1 0.0 - 0.5 K/uL   Basophils Relative 1 %   Basophils Absolute 0.1 0.0 - 0.1 K/uL   Immature Granulocytes 0 %   Abs Immature Granulocytes 0.04 0.00 - 0.07 K/uL    Comment: Performed at [facility], [address], [place], [place] [id number]  Basic metabolic panel     Status: Abnormal   Collection Time: 05/13/22 10:15 AM  Result Value Ref Range   Sodium 136 135 - 145 mmol/L   Potassium 3.7 3.5 - 5.1 mmol/L   Chloride 104 98 - 111 mmol/L   CO2 22 22 - 32 mmol/L   Glucose, Bld 168 (H) 70 - 99 mg/dL    Comment: Glucose reference range applies only to samples taken after fasting for at least 8 hours.   BUN 16 8 - 23 mg/dL   Creatinine, Ser 1.04 0.61 - 1.24 mg/dL   Calcium 8.7 (L) 8.9 - 10.3 mg/dL   GFR, Estimated >60 >60 mL/min    Comment: (NOTE) Calculated using the CKD-EPI Creatinine Equation (2021)    Anion gap 10 5 - 15    Comment: Performed at [facility] [address] [place], [place] [address]  Troponin I (High Sensitivity)     Status: None   Collection Time: 05/13/22 10:15 AM  Result Value Ref Range   Troponin I (High Sensitivity) 5 <18 ng/L    Comment: (NOTE) Elevated high sensitivity troponin I (hsTnI) values and significant  changes across serial measurements may suggest ACS but many other  chronic and acute conditions are known to elevate hsTnI results.  Refer to the "Links" section for chest pain algorithms and additional  guidance. Performed at [facility], [place] [address]  [place], [place] [address]   Resp panel by RT-PCR (RSV, Flu A&B, Covid) Anterior Nasal Swab     Status: None   Collection Time: 05/13/22 10:28 AM   Specimen: Anterior Nasal Swab  Result Value Ref Range   SARS Coronavirus  2 by RT PCR NEGATIVE NEGATIVE   Influenza A by PCR NEGATIVE NEGATIVE   Influenza B by PCR NEGATIVE NEGATIVE    Comment: (NOTE) The Xpert Xpress SARS-CoV-2/FLU/RSV plus assay is intended as an aid in the diagnosis of influenza from Nasopharyngeal swab specimens and should not be used as a sole basis for treatment. Nasal washings and aspirates are unacceptable for Xpert Xpress SARS-CoV-2/FLU/RSV testing.  Fact Sheet for Patients: EntrepreneurPulse.com.au  Fact Sheet for Healthcare Providers: IncredibleEmployment.be  This test is not yet approved or cleared by the [place] FDA and has been authorized for detection and/or diagnosis of SARS-CoV-2 by FDA under an Emergency Use Authorization (EUA). This EUA will remain in effect (meaning this test can be used) for the duration of the COVID-19 declaration under Section 564(b)(1) of the Act, 21 U.S.C. section 360bbb-3(b)(1), unless the authorization is terminated or revoked.     Resp Syncytial Virus by PCR NEGATIVE NEGATIVE    Comment: (NOTE) Fact Sheet for Patients: EntrepreneurPulse.com.au  Fact Sheet for Healthcare Providers: IncredibleEmployment.be  This test is not yet approved or cleared by the [place] FDA and has been authorized for detection and/or diagnosis of SARS-CoV-2 by FDA under an Emergency Use Authorization (EUA). This EUA will remain in effect (meaning this test can be used) for the duration of the COVID-19 declaration under Section 564(b)(1) of the Act, 21 U.S.C. section 360bbb-3(b)(1), unless the authorization is terminated or revoked.  Performed at [facility], [place] [address] [place], [place] [address]    Urinalysis, Routine w reflex microscopic -Urine, Clean Catch     Status: Abnormal   Collection Time: 05/13/22 12:40 PM  Result Value Ref Range   Color, Urine YELLOW YELLOW   APPearance HAZY (A) CLEAR   Specific Gravity, Urine 1.016 1.005 - 1.030   pH 5.0 5.0 - 8.0   Glucose, UA NEGATIVE NEGATIVE mg/dL   Hgb urine dipstick SMALL (A) NEGATIVE   Bilirubin Urine NEGATIVE NEGATIVE   Ketones, ur NEGATIVE NEGATIVE mg/dL   Protein, ur NEGATIVE NEGATIVE mg/dL   Nitrite NEGATIVE NEGATIVE   Leukocytes,Ua NEGATIVE NEGATIVE   RBC / HPF 6-10 0 - 5 RBC/hpf   WBC, UA 0-5 0 - 5 WBC/hpf   Bacteria, UA NONE SEEN NONE SEEN   Squamous Epithelial / HPF 0-5 0 - 5 /HPF   Mucus PRESENT     Comment: Performed at [facility], [place] [address] [place], [place] [address]  Troponin I (High Sensitivity)     Status: None   Collection Time: 05/13/22  1:14 PM  Result Value Ref Range   Troponin I (High Sensitivity) 5 <18 ng/L    Comment: (NOTE) Elevated high sensitivity troponin I (hsTnI) values and significant  changes across serial measurements may suggest ACS but many other  chronic and acute conditions are known to elevate hsTnI results.  Refer to the "Links" section for chest pain algorithms and additional  guidance. Performed at [facility], [place] [address], [place], [place] [address]    CT Head Wo Contrast  Result Date: 05/13/2022 CLINICAL DATA:  Syncope EXAM: CT HEAD WITHOUT CONTRAST TECHNIQUE: Contiguous axial images were obtained from the base of the skull through the vertex without intravenous contrast. RADIATION DOSE REDUCTION: This exam was performed according to the departmental dose-optimization program which includes automated exposure control, adjustment of the mA and/or kV according to patient size and/or use of iterative reconstruction technique. COMPARISON:  MRI Brain 03/10/22 FINDINGS: Brain: Unchanged size and  shape of the ventricular system. Redemonstrated extensive  periventricular hypodensity, favored to represent sequela of chronic microvascular ischemic change. No evidence of acute infarction, hemorrhage, extra-axial collection or mass lesion/mass effect. Vascular: No disproportionately hyperdense vessel or unexpected calcification. Skull: Normal. Negative for fracture or focal lesion. Sinuses/Orbits: No mastoid or middle ear effusion. Pansinus mucosal thickening without evidence of acute or chronic sinusitis. Right lens replacement. Orbits are otherwise unremarkable. Other: None. IMPRESSION: 1. No hemorrhage or CT evidence of an acute infarct. 2. Unchanged size and shape of the ventricular system 3. Redemonstrated extensive periventricular hypodensity, favored to represent sequela of chronic microvascular ischemic change. Electronically Signed   By: Marin Roberts M.D.   On: 05/13/2022 14:44   DG Chest Portable 1 View  Result Date: 05/13/2022 CLINICAL DATA:  Near-syncope EXAM: PORTABLE CHEST 1 VIEW COMPARISON:  CXR 02/17/21 FINDINGS: No pleural effusion. No pneumothorax. Low lung volumes. No focal airspace opacity. Normal cardiac and mediastinal contours. No radiographically apparent displaced rib fractures. Visualized upper abdomen is unremarkable. IMPRESSION: Low lung volumes. No acute cardiopulmonary process. Electronically Signed   By: Marin Roberts M.D.   On: 05/13/2022 12:36    Pending Labs Unresulted Labs (From admission, onward)     Start     Ordered   05/13/22 1521  Blood gas, venous  Once,   R        05/13/22 1520            Vitals/Pain Today's Vitals   05/13/22 1230 05/13/22 1245 05/13/22 1300 05/13/22 1410  BP: (!) 140/83 (!) 140/82 131/82   Pulse: 84 87 90   Resp: (!) 22 (!) 21 17   Temp:    98 F (36.7 C)  TempSrc:      SpO2: 96% 97% 95%   Weight:      Height:      PainSc:        Isolation Precautions Airborne and Contact precautions  Medications Medications  levothyroxine (SYNTHROID) tablet 112 mcg (has no administration  in time range)  pantoprazole (PROTONIX) EC tablet 20 mg (has no administration in time range)  ferrous sulfate tablet 325 mg (has no administration in time range)  dextromethorphan-guaiFENesin (MUCINEX DM) 30-600 MG per 12 hr tablet 1 tablet (has no administration in time range)  sodium chloride flush (NS) 0.9 % injection 3 mL (has no administration in time range)  enoxaparin (LOVENOX) injection 40 mg (has no administration in time range)  sodium chloride 0.9 % bolus 1,000 mL (0 mLs Intravenous Stopped 05/13/22 1125)    Mobility walks     Focused Assessments Neuro Assessment Handoff:  Swallow screen pass?  Cardiac Rhythm: Normal sinus rhythm       Neuro Assessment:   Neuro Checks:      Has TPA been given? No If patient is a Neuro Trauma and patient is going to OR before floor call report to [facility] nurse: [phone] or [phone]   R Recommendations: See Admitting Provider Note  Report given to:   Additional Notes:

## 2022-05-13 NOTE — ED Triage Notes (Signed)
Pt BIB [facility] from home d/t near syncopal episode x2, wife reports he began walking fast & shuffling before he sat on the ground & called EMS. On scene his initial BP was 68/40, A/Ox4, appeared pale & fel confused. 2 PIVs started & on the truck his pressure was 112/62. NSR, occasional PVC's, 80 bpm, 92% on RA was given 2L O2 via n/c, CBG 165. No fall, not on thinners, Hx of TIA.

## 2022-05-13 NOTE — ED Provider Notes (Signed)
[facility] Provider Note   CSN: [id number] Arrival date & time: 05/13/22  1011     History  Chief Complaint  Eric Case presents with   Near Syncopal    Hypotension    Eric Case is a 77 y.o. male with a past medical history significant for GERD, hypothyroidism, history of anemia who presents to the ED due syncope and hypotension.  Eric Case states Eric Case was at home and felt like Eric Case was going to pass out twice. Wife at bedside notes Eric Case fully passed out during 2nd episode. She notes his eyes were fully closed and Eric Case was confused when Eric Case woke up. No history of seizures. No urinary incontinence or tongue biting. She notes EMS checked BP right after syncopal episode which was 68/40. She states Eric Case took Tessalon perles last night for his cough.  Eric Case states prior to episode Eric Case felt lightheaded.  No preceding chest pain.  Eric Case states Eric Case feels better. EMS rechecked BP prior to transport and it normalized without intervention. Eric Case denies abdominal pain.  Denies recent illness however, admits to a cough for the past 3 days.  No hematochezia, hematemesis, or melena.  Not currently on blood thinners.  No fever or chills. No lower extremity edema. No history of blood clots.    History obtained from Eric Case and past medical records. No interpreter used during encounter.       Home Medications Prior to Admission medications   Medication Sig Start Date End Date Taking? Authorizing Provider  Ascorbic Acid (VITAMIN C) 1000 MG tablet Take 1,000 mg by mouth daily.      [provider]  co-enzyme Q-10 30 MG capsule Take 30 mg by mouth daily.     [provider]  ferrous sulfate 325 (65 FE) MG tablet Take 325 mg by mouth daily as needed (for iron deficiency related to prevacid). Eric Case not taking: Reported on 03/16/2022    [provider]  lansoprazole (PREVACID) 15 MG capsule Take 15 mg by mouth daily. 07/07/20    [provider]  levothyroxine (SYNTHROID, LEVOTHROID) 112 MCG tablet Take 112 mcg by mouth daily.      [provider]  Multiple Vitamin (MULTIVITAMIN WITH MINERALS) TABS tablet Take 1 tablet by mouth daily.    [provider]  Nutritional Supplements (SALMON OIL) CAPS Take 1 capsule by mouth daily.    [provider]  rivaroxaban (XARELTO) 10 MG TABS tablet Take 1 tablet (10 mg total) by mouth daily for 14 days. 03/20/21 04/03/21  Wylene Simmer, MD  Saw Palmetto, Serenoa repens, (SAW PALMETTO PO) Take 250 mg by mouth 2 (two) times daily.     [provider]  vitamin E 400 UNIT capsule Take 400 Units by mouth daily.      [provider]      Allergies    Ciprofloxacin, Doxycycline, Hydromorphone, Nsaids, Oxycodone-acetaminophen, Pennsaid [diclofenac sodium], Percocet [oxycodone-acetaminophen], and Sulfamethoxazole-trimethoprim    Review of Systems   Review of Systems  Constitutional:  Negative for chills and fever.  Eyes:  Negative for visual disturbance.  Respiratory:  Positive for cough. Negative for shortness of breath.   Cardiovascular:  Negative for chest pain.  Neurological:  Positive for light-headedness. Negative for weakness and numbness.  All other systems reviewed and are negative.   Physical Exam Updated Vital Signs BP 131/82   Pulse 90   Temp 98 F (36.7 C)   Resp 17   Ht '5\' 8"'$  (1.727  m)   Wt 90.7 kg   SpO2 95%   BMI 30.41 kg/m  Physical Exam Vitals and nursing note reviewed.  Constitutional:      General: Eric Case is not in acute distress.    Appearance: Eric Case is not ill-appearing.  HENT:     Head: Normocephalic.  Eyes:     Pupils: Pupils are equal, round, and reactive to light.  Cardiovascular:     Rate and Rhythm: Normal rate and regular rhythm.     Pulses: Normal pulses.     Heart sounds: Normal heart sounds. No murmur heard.    No friction rub. No gallop.  Pulmonary:     Effort: Pulmonary effort is normal.      Breath sounds: Normal breath sounds.  Abdominal:     General: Abdomen is flat. There is no distension.     Palpations: Abdomen is soft.     Tenderness: There is no abdominal tenderness. There is no guarding or rebound.  Musculoskeletal:        General: Normal range of motion.     Cervical back: Neck supple.  Skin:    General: Skin is warm and dry.  Neurological:     General: No focal deficit present.     Mental Status: Eric Case is alert.  Psychiatric:        Mood and Affect: Mood normal.        Behavior: Behavior normal.     ED Results / Procedures / Treatments   Labs (all labs ordered are listed, but only abnormal results are displayed) Labs Reviewed  BASIC METABOLIC PANEL - Abnormal; Notable for the following components:      Result Value   Glucose, Bld 168 (*)    Calcium 8.7 (*)    All other components within normal limits  URINALYSIS, ROUTINE W REFLEX MICROSCOPIC - Abnormal; Notable for the following components:   APPearance HAZY (*)    Hgb urine dipstick SMALL (*)    All other components within normal limits  RESP PANEL BY RT-PCR (RSV, FLU A&B, COVID)  [id number]  CBC WITH DIFFERENTIAL/PLATELET  TROPONIN I (HIGH SENSITIVITY)  TROPONIN I (HIGH SENSITIVITY)    EKG EKG Interpretation  Date/Time:  Thursday May 13 2022 10:14:26 EST Ventricular Rate:  90 PR Interval:  186 QRS Duration: 107 QT Interval:  358 QTC Calculation: 438 R Axis:   254 Text Interpretation: Sinus rhythm Left anterior fascicular block Abnormal R-wave progression, late transition No significant change since last tracing Confirmed by Georgina Snell [phone]) on 05/13/2022 10:22:00 AM  Radiology CT Head Wo Contrast  Result Date: 05/13/2022 CLINICAL DATA:  Syncope EXAM: CT HEAD WITHOUT CONTRAST TECHNIQUE: Contiguous axial images were obtained from the base of the skull through the vertex without intravenous contrast. RADIATION DOSE REDUCTION: This exam was performed according to the departmental  dose-optimization program which includes automated exposure control, adjustment of the mA and/or kV according to Eric Case size and/or use of iterative reconstruction technique. COMPARISON:  MRI Brain 03/10/22 FINDINGS: Brain: Unchanged size and shape of the ventricular system. Redemonstrated extensive periventricular hypodensity, favored to represent sequela of chronic microvascular ischemic change. No evidence of acute infarction, hemorrhage, extra-axial collection or mass lesion/mass effect. Vascular: No disproportionately hyperdense vessel or unexpected calcification. Skull: Normal. Negative for fracture or focal lesion. Sinuses/Orbits: No mastoid or middle ear effusion. Pansinus mucosal thickening without evidence of acute or chronic sinusitis. Right lens replacement. Orbits are otherwise unremarkable. Other: None. IMPRESSION: 1. No hemorrhage or CT evidence of an acute  infarct. 2. Unchanged size and shape of the ventricular system 3. Redemonstrated extensive periventricular hypodensity, favored to represent sequela of chronic microvascular ischemic change. Electronically Signed   By: Marin Roberts M.D.   On: 05/13/2022 14:44   DG Chest Portable 1 View  Result Date: 05/13/2022 CLINICAL DATA:  Near-syncope EXAM: PORTABLE CHEST 1 VIEW COMPARISON:  CXR 02/17/21 FINDINGS: No pleural effusion. No pneumothorax. Low lung volumes. No focal airspace opacity. Normal cardiac and mediastinal contours. No radiographically apparent displaced rib fractures. Visualized upper abdomen is unremarkable. IMPRESSION: Low lung volumes. No acute cardiopulmonary process. Electronically Signed   By: Marin Roberts M.D.   On: 05/13/2022 12:36    Procedures Procedures    Medications Ordered in ED Medications  sodium chloride 0.9 % bolus 1,000 mL (0 mLs Intravenous Stopped 05/13/22 1125)    ED Course/ Medical Decision Making/ A&P Clinical Course as of 05/13/22 1504  Thu May 13, 2022  1139 Glucose(!): 168 [CA]    Clinical  Course User Index [CA] Suzy Bouchard, PA-C                             Medical Decision Making Amount and/or Complexity of Data Reviewed Independent Historian: EMS Labs: ordered. Decision-making details documented in ED Course. Radiology: ordered and independent interpretation performed. Decision-making details documented in ED Course. ECG/medicine tests: ordered and independent interpretation performed. Decision-making details documented in ED Course.  Risk Decision regarding hospitalization.   This Eric Case presents to the ED for concern of syncope, this involves an extensive number of treatment options, and is a complaint that carries with it a high risk of complications and morbidity.  The differential diagnosis includes ACS, PE, cardiac arrhythmias, orthostatic hypotension, etc  77 year old male presents to the ED due to syncope and hypotension  No preceding chest pain.  Eric Case endorses a cough for the past 3 days.  Upon arrival, Eric Case afebrile, not tachycardic or hypoxic.  BP 107/68.  Eric Case denies any bleeding.  Normal neurological exam.  Routine labs ordered.  Cardiac labs to rule out atypical ACS.  Chest x-ray and UA to rule out evidence of infection.  RVP given cough. IVFs for soft BP. Will check orthostatic vitals. CT head.  CBC unremarkable.  No leukocytosis.  Normal hemoglobin.  BMP significant for hyperglycemia at 168.  No anion gap.  Normal renal function.  UA negative for signs of infection.  RVP negative.  Chest x-ray personally reviewed and interpreted which demonstrates low lung volumes.  No evidence of pneumonia.  EKG nonischemic. CT head negative for acute abnormalities.  Given syncopal episode of unknown etiology with risk factors will admit to hospitalist for further evaluation and observation for syncope. Discussed with Dr. Nechama Guard who evaluated Eric Case at bedside and agrees with assessment and plan.   3:03 PM Discussed with Dr. Roosevelt Locks with [facility] who agrees to  admit Eric Case.   Has PCP Hx TIA       Final Clinical Impression(s) / ED Diagnoses Final diagnoses:  Syncope, unspecified syncope type    Rx / DC Orders ED Discharge Orders     None         Karie Kirks 05/13/22 1504    Elgie Congo, MD 05/14/22 1726

## 2022-05-14 ENCOUNTER — Observation Stay (HOSPITAL_BASED_OUTPATIENT_CLINIC_OR_DEPARTMENT_OTHER): Payer: Medicare Other

## 2022-05-14 DIAGNOSIS — R55 Syncope and collapse: Secondary | ICD-10-CM | POA: Diagnosis not present

## 2022-05-14 LAB — GLUCOSE, CAPILLARY: Glucose-Capillary: 93 mg/dL (ref 70–99)

## 2022-05-14 NOTE — Progress Notes (Signed)
Bilateral carotid ultrasound study completed.   Please see CV Procedures for preliminary results.  Michiko Lineman, RVT  9:25 AM 05/14/22

## 2022-05-14 NOTE — Evaluation (Signed)
Physical Therapy Evaluation Patient Details Name: Eric Case MRN: [id number] DOB: October 03, 1945 Today's Date: 05/14/2022  History of Present Illness  Pt is a 77 y.o. male admitted 2/22 with syncope and hypotension. PMH:  hypothyroidism, GERD, stroke, PAF off anticoagulation, h/o R TKA (2018)   Clinical Impression  PT eval complete. Pt is independent with all functional mobility, including ascend/descend flight of stairs. HR 82, SpO2 96% on RA, and BP stable during mobility.  Pt reports no dizziness or lightheadedness. No LOB noted. Pt/wife report no questions/concerns regarding mobility. No further skilled PT intervention indicated. PT signing off.      Recommendations for follow up therapy are one component of a multi-disciplinary discharge planning process, led by the attending physician.  Recommendations may be updated based on patient status, additional functional criteria and insurance authorization.  Follow Up Recommendations No PT follow up      Assistance Recommended at Discharge None  Patient can return home with the following       Equipment Recommendations None recommended by PT  Recommendations for Other Services       Functional Status Assessment Patient has not had a recent decline in their functional status     Precautions / Restrictions Precautions Precautions: None      Mobility  Bed Mobility Overal bed mobility: Independent                  Transfers Overall transfer level: Independent                      Ambulation/Gait Ambulation/Gait assistance: Independent Gait Distance (Feet): 250 Feet Assistive device: None Gait Pattern/deviations: WFL(Within Functional Limits) Gait velocity: WNL Gait velocity interpretation: >4.37 ft/sec, indicative of normal walking speed   General Gait Details: HR 82. SpO2 96% on RA. BP stable.  Stairs Stairs: Yes Stairs assistance: Modified independent (Device/Increase time) Stair Management: One  rail Left, Alternating pattern, Forwards Number of Stairs: 12    Wheelchair Mobility    Modified Rankin (Stroke Patients Only)       Balance Overall balance assessment: Independent                                           Pertinent Vitals/Pain Pain Assessment Pain Assessment: No/denies pain    Home Living Family/patient expects to be discharged to:: Private residence Living Arrangements: Spouse/significant other Available Help at Discharge: Family Type of Home: House Home Access: Stairs to enter Entrance Stairs-Rails: None Technical brewer of Steps: 2   Home Layout: One level Home Equipment: Conservation officer, nature (2 wheels)      Prior Function Prior Level of Function : Independent/Modified Independent;Driving             Mobility Comments: hikes daily for exercise       Hand Dominance   Dominant Hand: Right    Extremity/Trunk Assessment   Upper Extremity Assessment Upper Extremity Assessment: Overall WFL for tasks assessed    Lower Extremity Assessment Lower Extremity Assessment: Overall WFL for tasks assessed    Cervical / Trunk Assessment Cervical / Trunk Assessment: Kyphotic  Communication   Communication: No difficulties  Cognition Arousal/Alertness: Awake/alert Behavior During Therapy: WFL for tasks assessed/performed Overall Cognitive Status: Within Functional Limits for tasks assessed  General Comments General comments (skin integrity, edema, etc.): orthostatic BP stable    Exercises     Assessment/Plan    PT Assessment Patient does not need any further PT services  PT Problem List         PT Treatment Interventions      PT Goals (Current goals can be found in the Care Plan section)  Acute Rehab PT Goals Patient Stated Goal: home PT Goal Formulation: All assessment and education complete, DC therapy    Frequency       Co-evaluation                AM-PAC PT "6 Clicks" Mobility  Outcome Measure Help needed turning from your back to your side while in a flat bed without using bedrails?: None Help needed moving from lying on your back to sitting on the side of a flat bed without using bedrails?: None Help needed moving to and from a bed to a chair (including a wheelchair)?: None Help needed standing up from a chair using your arms (e.g., wheelchair or bedside chair)?: None Help needed to walk in hospital room?: None Help needed climbing 3-5 steps with a railing? : None 6 Click Score: 24    End of Session   Activity Tolerance: Patient tolerated treatment well Patient left: in bed;with family/visitor present;with call bell/phone within reach Nurse Communication: Mobility status PT Visit Diagnosis: Difficulty in walking, not elsewhere classified (R26.2)    Time: 1018-1030 PT Time Calculation (min) (ACUTE ONLY): 12 min   Charges:   PT Evaluation $PT Eval Low Complexity: 1 Low          Gloriann Loan., PT  Office # [phone]   Lorriane Shire 05/14/2022, 10:39 AM

## 2022-05-14 NOTE — TOC Transition Note (Signed)
Transition of Care [facility]) - CM/SW Discharge Note   Patient Details  Name: Eric Case MRN: [id number] Date of Birth: 08-Aug-1945  Transition of Care [facility]) CM/SW Contact:  Zenon Mayo, RN Phone Number: 05/14/2022, 11:50 AM   Clinical Narrative:    Patient is for dc today, has no needs.         Patient Goals and CMS Choice      Discharge Placement                         Discharge Plan and Services Additional resources added to the After Visit Summary for                                       Social Determinants of Health (SDOH) Interventions SDOH Screenings   Food Insecurity: Unknown (05/13/2022)  Housing: Low Risk  (05/13/2022)  Transportation Needs: No Transportation Needs (05/13/2022)  Utilities: Not At Risk (05/13/2022)  Tobacco Use: Low Risk  (05/13/2022)     Readmission Risk Interventions     No data to display

## 2022-05-14 NOTE — Discharge Summary (Signed)
Physician Discharge Summary  Eric Case [id number] DOB: 08/14/1945 DOA: 05/13/2022  PCP: Velna Hatchet, MD  Admit date: 05/13/2022 Discharge date: 05/14/2022  Time spent: 40 minutes  Recommendations for Outpatient Follow-up:  Follow outpatient CBC/CMP  Follow blood pressures outpatient Follow with cardiology outpatient Consider cardiac monitor outpatient   Discharge Diagnoses:  Principal Problem:   Syncope Active Problems:   GERD   Syncope, vasovagal   Discharge Condition: stable  Diet recommendation: heart healthy  Filed Weights   05/13/22 1021 05/13/22 1800 05/14/22 0500  Weight: 90.7 kg 87.7 kg 87 kg    History of present illness:   Eric Case is Eric Case 77 y.o. male with medical history significant of hypothyroidism, GERD, stroke, PAF off anticoagulation, presented with near syncope.   Patient started to have dry cough 2 days ago, and running low-grade fever 100.02 nights ago.  Today, this morning while walking in the park, patient complained about feeling dizziness about Eric Case fall and had to hold wife, and wife was able to catch him and lowered him to the floor and patient did not pass out but wife did not report the patient looked pale and cold and clammy.  When EMS arrived, patient had another episode of near syncope, when he complained about feeling lightheaded again and EMS checked his blood pressure SBP in the 60s.  En route to hospital, patient's blood pressure normalized without any intervention.  Last 2 days, patient was taking Tessalon with no significant improvement of the dry cough and but this morning, before the near syncope episode patient was not coughing much.  Denied any urinary symptoms no diarrhea no pain.  2 months ago patient was hospitalized to [facility] for stroke, and was told he had " bleeding inside the brain".  He had Eric Case MRI of the brain July 2023 showed 5 foci of microhemorrhage in the brain.  His echo cardiogram in December showed  concentric hypertrophy of left ventricle.  And he does not take any BP meds.  Patient also has history of remote Eric Case was briefly on Xarelto but discontinued and he does not recall what was the reason. ED Course: Afebrile, none tachycardia nonhypotensive nonhypoxic.  CT head showed negative of acute findings or hemorrhage.  Blood work showed hemoglobin 15, creatinine 1.0, glucose 168, COVID-negative.  UA showed no signs of UTI.  Chest x-ray clear no acute infiltrates  He was admitted with presyncope.  Workup was reassuring.  Plan for outpatient cardiology follow up.  See below for additional details  Hospital Course:  Assessment and Plan:  Near syncope - unclear - related to hypotension which could be due to dehydration in setting of his URI.  Other ddx includes vagally mediated with his cough.  Orthostatics negative.  Echo from 02/2022 with EF 55-60%, concentric hypertrophy of LV wall.  EKG with sinus - no qt prolongation (telemetry without arrhythmia).  Carotid US without significant abnormality.  Plan for discharge with outpatient cardiology follow up.  Can consider cardiac monitoring outpatient.     Hx of hemorrhagic stroke -5 chronic cerebral microhemorrhage foci on brain MRI in September 2023.  Given the patient also has significant concentric LVH send echo, recommend outpatient follow-up with cardiology for ambulatory BP monitoring to consider initiate BP meds (with presentation with hypotension and syncope, would consider this carefully)   Hypothyroidism, GERD -Stable, continue home meds      Procedures: Carotid doppler Summary:  Right Carotid: Velocities in the right ICA are consistent with Melenda Bielak 1-39%  stenosis.   Left Carotid: Velocities in the left ICA are consistent with Johndavid Geralds 1-39%  stenosis.   Vertebrals: Bilateral vertebral arteries demonstrate antegrade flow.  Subclavians: Normal flow hemodynamics were seen in bilateral subclavian               arteries.      Consultations: none  Discharge Exam: Vitals:   05/14/22 0849 05/14/22 0851  BP: 123/79 123/81  Pulse:  80  Resp:  18  Temp:  98.3 F (36.8 C)  SpO2:     No complaints Wife at bedside  General: No acute distress. Cardiovascular: Heart sounds show Barnet Benavides regular rate, and rhythm. Lungs: Clear to auscultation bilaterally Abdomen: Soft, nontender, nondistended  Neurological: Alert and oriented 3. Moves all extremities 4 with equal strength. Cranial nerves II through XII grossly intact. Extremities: No clubbing or cyanosis. No edema.   Discharge Instructions   Discharge Instructions     Ambulatory referral to Cardiology   Complete by: As directed    Call MD for:  difficulty breathing, headache or visual disturbances   Complete by: As directed    Call MD for:  extreme fatigue   Complete by: As directed    Call MD for:  hives   Complete by: As directed    Call MD for:  persistant dizziness or light-headedness   Complete by: As directed    Call MD for:  persistant nausea and vomiting   Complete by: As directed    Call MD for:  redness, tenderness, or signs of infection (pain, swelling, redness, odor or green/yellow discharge around incision site)   Complete by: As directed    Call MD for:  severe uncontrolled pain   Complete by: As directed    Call MD for:  temperature >100.4   Complete by: As directed    Diet - low sodium heart healthy   Complete by: As directed    Discharge instructions   Complete by: As directed    You were seen for an episode of near fainting (presyncope or near syncope).  This occurred in the setting of what sounds like Kately Graffam viral upper respiratory illness and Shaft Corigliano new cough.  Your fainting spell may have been related to your cough and been vagally mediated.  It also may have been related to dehydration due to your acute illness.    We checked your blood pressures which have improved.  Change positions slowly as you go from lying down to sitting to  standing.    We did an ultrasound of your carotids which did not show Lavilla Delamora clear cause for your episode.   Your previous echo was reassuring.  Follow up with cardiology outpatient to follow up this episode and to discuss whether you might benefit from Eric Case cardiac monitor.  Follow your blood pressure outpatient with your PCP and cardiology.  Return for new, recurrent, or worsening symptoms.  Please ask your PCP to request records from this hospitalization so they know what was done and what the next steps will be.   Increase activity slowly   Complete by: As directed       Allergies as of 05/14/2022       Reactions   Ciprofloxacin Other (See Comments)   chest pain   Doxycycline Other (See Comments)   Hydromorphone Itching   Nsaids    Other reaction(s): Unknown   Oxycodone-acetaminophen Itching   REACTION: itching   Pennsaid [diclofenac Sodium] Other (See Comments)   "made me feel bad all  over" Flu like symptoms/fever   Percocet [oxycodone-acetaminophen]    unknown   Sulfamethoxazole-trimethoprim Rash   septra        Medication List     TAKE these medications    aspirin EC 81 MG tablet Take 81 mg by mouth daily. Swallow whole.   co-enzyme Q-10 30 MG capsule Take 30 mg by mouth daily.   lansoprazole 15 MG capsule Commonly known as: PREVACID Take 15 mg by mouth daily.   levothyroxine 112 MCG tablet Commonly known as: SYNTHROID Take 112 mcg by mouth daily.   multivitamin with minerals Tabs tablet Take 1 tablet by mouth daily.   rosuvastatin 5 MG tablet Commonly known as: CRESTOR Take 5 mg by mouth daily.   Salmon Oil Caps Take 1 capsule by mouth daily.   SAW PALMETTO PO Take 250 mg by mouth 2 (two) times daily.   vitamin C 1000 MG tablet Take 1,000 mg by mouth daily.   vitamin E 180 MG (400 UNITS) capsule Take 400 Units by mouth daily.       Allergies  Allergen Reactions   Ciprofloxacin Other (See Comments)    chest pain   Doxycycline Other  (See Comments)   Hydromorphone Itching   Nsaids     Other reaction(s): Unknown   Oxycodone-Acetaminophen Itching    REACTION: itching   Pennsaid [Diclofenac Sodium] Other (See Comments)    "made me feel bad all over" Flu like symptoms/fever    Percocet [Oxycodone-Acetaminophen]     unknown   Sulfamethoxazole-Trimethoprim Rash    septra      The results of significant diagnostics from this hospitalization (including imaging, microbiology, ancillary and laboratory) are listed below for reference.    Significant Diagnostic Studies: VAS US CAROTID  Result Date: 05/14/2022 Carotid Arterial Duplex Study Patient Name:  GUSTABO BECH Kolander  Date of Exam:   05/14/2022 Medical Rec #: [id number]       Accession #:    [id number] Date of Birth: [birth year]       Patient Gender: M Patient Age:   11 years Exam Location:  [facility] Procedure:      VAS US CAROTID Referring Phys: Wynetta Fines --------------------------------------------------------------------------------  Indications:       CVA, Syncope and Weakness. Risk Factors:      Hyperlipidemia. Comparison Study:  No prior study. Performing Technologist: Candie Chroman  Examination Guidelines: Amish Mintzer complete evaluation includes B-mode imaging, spectral Doppler, color Doppler, and power Doppler as needed of all accessible portions of each vessel. Bilateral testing is considered an integral part of Miken Stecher complete examination. Limited examinations for reoccurring indications may be performed as noted.  Right Carotid Findings: +----------+--------+--------+--------+------------------+--------+           PSV cm/sEDV cm/sStenosisPlaque DescriptionComments +----------+--------+--------+--------+------------------+--------+ CCA Prox  80      12                                         +----------+--------+--------+--------+------------------+--------+ CCA Distal94      21                                          +----------+--------+--------+--------+------------------+--------+ ICA Prox  74      23      1-39%                              +----------+--------+--------+--------+------------------+--------+  ICA Mid   72      18                                         +----------+--------+--------+--------+------------------+--------+ ICA Distal59      21                                         +----------+--------+--------+--------+------------------+--------+ ECA       129     18                                         +----------+--------+--------+--------+------------------+--------+ +----------+--------+-------+----------------+-------------------+           PSV cm/sEDV cmsDescribe        Arm Pressure (mmHG) +----------+--------+-------+----------------+-------------------+ [id number]     6      Multiphasic, WNL                    +----------+--------+-------+----------------+-------------------+ +---------+--------+--+--------+--+---------+ VertebralPSV cm/s33EDV cm/s10Antegrade +---------+--------+--+--------+--+---------+  Left Carotid Findings: +----------+--------+--------+--------+---------------------+------------------+           PSV cm/sEDV cm/sStenosisPlaque Description   Comments           +----------+--------+--------+--------+---------------------+------------------+ CCA Prox  113     22                                                      +----------+--------+--------+--------+---------------------+------------------+ CCA Distal107     24                                   intimal thickening +----------+--------+--------+--------+---------------------+------------------+ ICA Prox  40      14      1-39%   homogeneous and                                                           smooth                                  +----------+--------+--------+--------+---------------------+------------------+ ICA Mid   104     27                                                       +----------+--------+--------+--------+---------------------+------------------+ ICA Distal104     35                                                      +----------+--------+--------+--------+---------------------+------------------+ ECA  120     21                                                      +----------+--------+--------+--------+---------------------+------------------+ +----------+--------+--------+----------------+-------------------+           PSV cm/sEDV cm/sDescribe        Arm Pressure (mmHG) +----------+--------+--------+----------------+-------------------+ [id number]      15      Multiphasic, WNL                    +----------+--------+--------+----------------+-------------------+ +---------+--------+--+--------+-+---------+ VertebralPSV cm/s30EDV cm/s8Antegrade +---------+--------+--+--------+-+---------+   Summary: Right Carotid: Velocities in the right ICA are consistent with Sebastion Jun 1-39% stenosis. Left Carotid: Velocities in the left ICA are consistent with Hiren Peplinski 1-39% stenosis. Vertebrals:  Bilateral vertebral arteries demonstrate antegrade flow. Subclavians: Normal flow hemodynamics were seen in bilateral subclavian              arteries. *See table(s) above for measurements and observations.     Preliminary    CT Head Wo Contrast  Result Date: 05/13/2022 CLINICAL DATA:  Syncope EXAM: CT HEAD WITHOUT CONTRAST TECHNIQUE: Contiguous axial images were obtained from the base of the skull through the vertex without intravenous contrast. RADIATION DOSE REDUCTION: This exam was performed according to the departmental dose-optimization program which includes automated exposure control, adjustment of the mA and/or kV according to patient size and/or use of iterative reconstruction technique. COMPARISON:  MRI Brain 03/10/22 FINDINGS: Brain: Unchanged size and shape of the ventricular system. Redemonstrated  extensive periventricular hypodensity, favored to represent sequela of chronic microvascular ischemic change. No evidence of acute infarction, hemorrhage, extra-axial collection or mass lesion/mass effect. Vascular: No disproportionately hyperdense vessel or unexpected calcification. Skull: Normal. Negative for fracture or focal lesion. Sinuses/Orbits: No mastoid or middle ear effusion. Pansinus mucosal thickening without evidence of acute or chronic sinusitis. Right lens replacement. Orbits are otherwise unremarkable. Other: None. IMPRESSION: 1. No hemorrhage or CT evidence of an acute infarct. 2. Unchanged size and shape of the ventricular system 3. Redemonstrated extensive periventricular hypodensity, favored to represent sequela of chronic microvascular ischemic change. Electronically Signed   By: Marin Roberts M.D.   On: 05/13/2022 14:44   DG Chest Portable 1 View  Result Date: 05/13/2022 CLINICAL DATA:  Near-syncope EXAM: PORTABLE CHEST 1 VIEW COMPARISON:  CXR 02/17/21 FINDINGS: No pleural effusion. No pneumothorax. Low lung volumes. No focal airspace opacity. Normal cardiac and mediastinal contours. No radiographically apparent displaced rib fractures. Visualized upper abdomen is unremarkable. IMPRESSION: Low lung volumes. No acute cardiopulmonary process. Electronically Signed   By: Marin Roberts M.D.   On: 05/13/2022 12:36    Microbiology: Recent Results (from the past 240 hour(s))  Resp panel by RT-PCR (RSV, Flu Jonnelle Lawniczak&B, Covid) Anterior Nasal Swab     Status: None   Collection Time: 05/13/22 10:28 AM   Specimen: Anterior Nasal Swab  Result Value Ref Range Status   SARS Coronavirus 2 by RT PCR NEGATIVE NEGATIVE Final   Influenza Sua Spadafora by PCR NEGATIVE NEGATIVE Final   Influenza B by PCR NEGATIVE NEGATIVE Final    Comment: (NOTE) The Xpert Xpress SARS-CoV-2/FLU/RSV plus assay is intended as an aid in the diagnosis of influenza from Nasopharyngeal swab specimens and should not be used as Tommy Minichiello sole basis  for treatment. Nasal washings and aspirates are unacceptable for Xpert Xpress SARS-CoV-2/FLU/RSV testing.  Fact Sheet for Patients: EntrepreneurPulse.com.au  Fact Sheet for Healthcare Providers: IncredibleEmployment.be  This test is not yet approved or cleared by the [place] FDA and has been authorized for detection and/or diagnosis of SARS-CoV-2 by FDA under an Emergency Use Authorization (EUA). This EUA will remain in effect (meaning this test can be used) for the duration of the COVID-19 declaration under Section 564(b)(1) of the Act, 21 U.S.C. section 360bbb-3(b)(1), unless the authorization is terminated or revoked.     Resp Syncytial Virus by PCR NEGATIVE NEGATIVE Final    Comment: (NOTE) Fact Sheet for Patients: EntrepreneurPulse.com.au  Fact Sheet for Healthcare Providers: IncredibleEmployment.be  This test is not yet approved or cleared by the [place] FDA and has been authorized for detection and/or diagnosis of SARS-CoV-2 by FDA under an Emergency Use Authorization (EUA). This EUA will remain in effect (meaning this test can be used) for the duration of the COVID-19 declaration under Section 564(b)(1) of the Act, 21 U.S.C. section 360bbb-3(b)(1), unless the authorization is terminated or revoked.  Performed at [facility], [place] [address] [place], [place] [address]      Labs: Basic Metabolic Panel: Recent Labs  Lab 05/13/22 1015 05/13/22 1649  NA 136 135  K 3.7 4.2  CL 104  --   CO2 22  --   GLUCOSE 168*  --   BUN 16  --   CREATININE 1.04  --   CALCIUM 8.7*  --    Liver Function Tests: No results for input(s): "AST", "ALT", "ALKPHOS", "BILITOT", "PROT", "ALBUMIN" in the last 168 hours. No results for input(s): "LIPASE", "AMYLASE" in the last 168 hours. No results for input(s): "AMMONIA" in the last 168 hours. CBC: Recent Labs  Lab 05/13/22 1015  05/13/22 1649  WBC 9.6  --   NEUTROABS 7.7  --   HGB 15.1 16.0  HCT 46.6 47.0  MCV 88.6  --   PLT 248  --    Cardiac Enzymes: No results for input(s): "CKTOTAL", "CKMB", "CKMBINDEX", "TROPONINI" in the last 168 hours. BNP: BNP (last 3 results) No results for input(s): "BNP" in the last 8760 hours.  ProBNP (last 3 results) No results for input(s): "PROBNP" in the last 8760 hours.  CBG: Recent Labs  Lab 05/14/22 0526  GLUCAP 93       Signed:  Fayrene Helper MD.  [facility] 05/14/2022, 11:44 AM

## 2022-05-19 DIAGNOSIS — R55 Syncope and collapse: Secondary | ICD-10-CM | POA: Diagnosis not present

## 2022-05-19 DIAGNOSIS — F0390 Unspecified dementia without behavioral disturbance: Secondary | ICD-10-CM | POA: Diagnosis not present

## 2022-05-19 DIAGNOSIS — R030 Elevated blood-pressure reading, without diagnosis of hypertension: Secondary | ICD-10-CM | POA: Diagnosis not present

## 2022-05-19 DIAGNOSIS — G459 Transient cerebral ischemic attack, unspecified: Secondary | ICD-10-CM | POA: Diagnosis not present

## 2022-06-14 DIAGNOSIS — I10 Essential (primary) hypertension: Secondary | ICD-10-CM | POA: Diagnosis not present

## 2022-06-14 DIAGNOSIS — E039 Hypothyroidism, unspecified: Secondary | ICD-10-CM | POA: Diagnosis not present

## 2022-06-15 NOTE — Progress Notes (Unsigned)
   CC:  memory loss  Follow-up Visit  Last visit: 03/16/22  Brief HPI: 77 year old male with a history of GERD, hypothyroidism who follows in clinic for memory loss which began after he developed COVID and fell hitting his head in November 2022. Brain MRI 11/27/21 showed moderate-severe atrophy and moderate chronic small vessel ischemic disease. It also showed 5 microhemorrhages. Had another MRI 02/2022 which showed an 11 mm focus of hemosiderin which was new from MRI in September, concerning for cerebral amyloid angiopathy.  At his last visit referral for neuropsychological evaluation was placed. Interval History: ***   Physical Exam:   Vital Signs: There were no vitals taken for this visit. GENERAL:  well appearing, in no acute distress, alert  SKIN:  Color, texture, turgor normal. No rashes or lesions HEAD:  Normocephalic/atraumatic. RESP: normal respiratory effort MSK:  No gross joint deformities.   NEUROLOGICAL: Mental Status: Alert, oriented to person, place and time, Follows commands, and Speech fluent and appropriate. Cranial Nerves: PERRL, face symmetric, no dysarthria, hearing grossly intact Motor: moves all extremities equally Gait: normal-based.  IMPRESSION: ***  PLAN: ***   Follow-up: ***  I spent a total of *** minutes on the date of the service. Discussed medication side effects, adverse reactions and drug interactions. Written educational materials and patient instructions outlining all of the above were given.  Genia Harold, MD

## 2022-06-16 ENCOUNTER — Encounter: Payer: Self-pay | Admitting: Psychiatry

## 2022-06-16 ENCOUNTER — Ambulatory Visit: Payer: Medicare Other | Admitting: Psychiatry

## 2022-06-16 VITALS — BP 137/89 | HR 76 | Ht 68.0 in | Wt 198.0 lb

## 2022-06-16 DIAGNOSIS — I680 Cerebral amyloid angiopathy: Secondary | ICD-10-CM

## 2022-06-16 DIAGNOSIS — R413 Other amnesia: Secondary | ICD-10-CM | POA: Diagnosis not present

## 2022-06-16 NOTE — Patient Instructions (Addendum)
Cerebral amyloid angiopathy Cerebral amyloid angiopathy (CAA) is a condition in which proteins called amyloid build up on the walls of the arteries in the brain. CAA increases the risk of bleeding into the brain (hemorrhagic stroke) and dementia.  Causes People with CAA have deposits of amyloid protein in the walls of blood vessels in the brain. The protein is usually not deposited anywhere else in the body. The major risk factor is increasing age. CAA is more often seen in people older than 40. Sometimes, it is passed down through families.  Symptoms CAA can cause bleeding into the brain.  Some people have gradual memory problems. When a CT scan or MRI is done, there are often signs that they have had small bleeds in the brain that they may not have realized.  Small bleeds (microhemorrhages) can cause symptoms such as: Episodes of confusion Headaches that come and go Loss of mental function (dementia) Weakness or unusual sensations that come and go, and involve smaller areas  If there is a large bleed, immediate symptoms occur and resemble a stroke. These symptoms include: Drowsiness Severe Headache  Sensation changes, speech problems, weakness, or paralysis Seizures Stupor or coma (rarely) Vomiting  Exams and Tests CAA is hard to diagnose with certainty without a sample of brain tissue. This is usually done after death or when a biopsy of the blood vessels of the brain is done.  A physical exam can be normal if the bleed is small.   Imaging tests of the head that may be done include: CT scan or MRI scan to check for bleeding in the brain. Often these tests will show many small bleeds (microhemorrhages) in CAA  Treatment There is no known effective treatment. The goal of treatment is to relieve symptoms. In some cases, rehabilitation is needed for weakness or clumsiness. This can include physical, occupational, or speech therapy.  Sometimes, medicines that help improve memory,  such as those for Alzheimer disease, are used. Seizures, also called amyloid spells, may be treated with anti-seizure drugs.  Outlook (Prognosis) People may accumulate more microhemorrhages over time. This increases the risk of dementia and brain bleeds as the disease progresses. Maintaining a healthy blood pressure can help reduce the risk of bleeding in the brian.

## 2022-08-17 ENCOUNTER — Emergency Department (HOSPITAL_COMMUNITY): Payer: Medicare Other

## 2022-08-17 ENCOUNTER — Encounter (HOSPITAL_COMMUNITY): Payer: Self-pay

## 2022-08-17 ENCOUNTER — Observation Stay (HOSPITAL_COMMUNITY)
Admission: EM | Admit: 2022-08-17 | Discharge: 2022-08-18 | Disposition: A | Discharge status: Home / Self Care | Payer: Medicare Other | Attending: Internal Medicine | Admitting: Internal Medicine

## 2022-08-17 ENCOUNTER — Observation Stay (HOSPITAL_COMMUNITY): Payer: Medicare Other

## 2022-08-17 ENCOUNTER — Other Ambulatory Visit: Payer: Self-pay

## 2022-08-17 DIAGNOSIS — E785 Hyperlipidemia, unspecified: Secondary | ICD-10-CM | POA: Insufficient documentation

## 2022-08-17 DIAGNOSIS — G459 Transient cerebral ischemic attack, unspecified: Secondary | ICD-10-CM

## 2022-08-17 DIAGNOSIS — E039 Hypothyroidism, unspecified: Secondary | ICD-10-CM | POA: Insufficient documentation

## 2022-08-17 DIAGNOSIS — R4701 Aphasia: Secondary | ICD-10-CM | POA: Diagnosis not present

## 2022-08-17 DIAGNOSIS — Z79899 Other long term (current) drug therapy: Secondary | ICD-10-CM | POA: Insufficient documentation

## 2022-08-17 DIAGNOSIS — Z8673 Personal history of transient ischemic attack (TIA), and cerebral infarction without residual deficits: Secondary | ICD-10-CM | POA: Insufficient documentation

## 2022-08-17 DIAGNOSIS — Z7982 Long term (current) use of aspirin: Secondary | ICD-10-CM | POA: Insufficient documentation

## 2022-08-17 DIAGNOSIS — I4891 Unspecified atrial fibrillation: Secondary | ICD-10-CM | POA: Diagnosis not present

## 2022-08-17 DIAGNOSIS — R9431 Abnormal electrocardiogram [ECG] [EKG]: Secondary | ICD-10-CM | POA: Diagnosis not present

## 2022-08-17 DIAGNOSIS — G319 Degenerative disease of nervous system, unspecified: Secondary | ICD-10-CM | POA: Diagnosis not present

## 2022-08-17 DIAGNOSIS — I700 Atherosclerosis of aorta: Secondary | ICD-10-CM | POA: Diagnosis not present

## 2022-08-17 DIAGNOSIS — Z96651 Presence of right artificial knee joint: Secondary | ICD-10-CM | POA: Diagnosis not present

## 2022-08-17 DIAGNOSIS — I444 Left anterior fascicular block: Secondary | ICD-10-CM | POA: Diagnosis not present

## 2022-08-17 LAB — COMPREHENSIVE METABOLIC PANEL
ALT: 20 U/L (ref 0–44)
AST: 21 U/L (ref 15–41)
Albumin: 4.1 g/dL (ref 3.5–5.0)
Alkaline Phosphatase: 48 U/L (ref 38–126)
Anion gap: 11 (ref 5–15)
BUN: 23 mg/dL (ref 8–23)
CO2: 21 mmol/L — ABNORMAL LOW (ref 22–32)
Calcium: 9 mg/dL (ref 8.9–10.3)
Chloride: 104 mmol/L (ref 98–111)
Creatinine, Ser: 0.84 mg/dL (ref 0.61–1.24)
GFR, Estimated: >60 mL/min (ref >60)
Glucose, Bld: 146 mg/dL — ABNORMAL HIGH (ref 70–99)
Potassium: 3.9 mmol/L (ref 3.5–5.1)
Sodium: 136 mmol/L (ref 135–145)
Total Bilirubin: 0.7 mg/dL (ref 0.3–1.2)
Total Protein: 7.3 g/dL (ref 6.5–8.1)

## 2022-08-17 LAB — URINALYSIS, ROUTINE W REFLEX MICROSCOPIC
Bacteria, UA: NONE SEEN
Bilirubin Urine: NEGATIVE
Glucose, UA: NEGATIVE mg/dL
Ketones, ur: NEGATIVE mg/dL
Leukocytes,Ua: NEGATIVE
Nitrite: NEGATIVE
Protein, ur: NEGATIVE mg/dL
Specific Gravity, Urine: 1.014 (ref 1.005–1.030)
pH: 6 (ref 5.0–8.0)
pH: 5 (ref 5.0–8.0)

## 2022-08-17 LAB — I-STAT CHEM 8, ED
BUN: 24 mg/dL — ABNORMAL HIGH (ref 8–23)
Calcium, Ion: 1.19 mmol/L (ref 1.15–1.40)
Chloride: 102 mmol/L (ref 98–111)
Creatinine, Ser: 0.8 mg/dL (ref 0.61–1.24)
Glucose, Bld: 143 mg/dL — ABNORMAL HIGH (ref 70–99)
HCT: 47 % (ref 39.0–52.0)
Hemoglobin: 16 g/dL (ref 13.0–17.0)
Potassium: 4.1 mmol/L (ref 3.5–5.1)
Sodium: 138 mmol/L (ref 135–145)
TCO2: 24 mmol/L (ref 22–32)

## 2022-08-17 LAB — ETHANOL: Alcohol, Ethyl (B): <10 mg/dL (ref <10)

## 2022-08-17 LAB — PROTIME-INR
INR: 1 (ref 0.8–1.2)
Prothrombin Time: 13.3 seconds (ref 11.4–15.2)

## 2022-08-17 LAB — CBC
HCT: 47.4 % (ref 39.0–52.0)
Hemoglobin: 15.4 g/dL (ref 13.0–17.0)
MCH: 28.4 pg (ref 26.0–34.0)
MCHC: 32.5 g/dL (ref 30.0–36.0)
MCV: 87.5 fL (ref 80.0–100.0)
Platelets: 274 10*3/uL (ref 150–400)
RBC: 5.42 MIL/uL (ref 4.22–5.81)
RDW: 14 % (ref 11.5–15.5)
WBC: 9 10*3/uL (ref 4.0–10.5)
nRBC: 0 % (ref 0.0–0.2)

## 2022-08-17 LAB — DIFFERENTIAL
Abs Immature Granulocytes: 0.02 10*3/uL (ref 0.00–0.07)
Basophils Absolute: 0.1 10*3/uL (ref 0.0–0.1)
Basophils Relative: 1 %
Eosinophils Absolute: 0.3 10*3/uL (ref 0.0–0.5)
Eosinophils Relative: 3 %
Immature Granulocytes: 0 %
Lymphocytes Relative: 22 %
Lymphs Abs: 2 10*3/uL (ref 0.7–4.0)
Monocytes Absolute: 0.7 10*3/uL (ref 0.1–1.0)
Monocytes Relative: 7 %
Neutro Abs: 5.9 10*3/uL (ref 1.7–7.7)
Neutrophils Relative %: 67 %

## 2022-08-17 LAB — RAPID URINE DRUG SCREEN, HOSP PERFORMED
Amphetamines: NOT DETECTED
Barbiturates: NOT DETECTED
Benzodiazepines: NOT DETECTED
Cocaine: NOT DETECTED
Opiates: NOT DETECTED
Tetrahydrocannabinol: NOT DETECTED

## 2022-08-17 LAB — APTT: aPTT: 32 seconds (ref 24–36)

## 2022-08-17 MED ORDER — SENNOSIDES-DOCUSATE SODIUM 8.6-50 MG PO TABS: 1.0000 | ORAL_TABLET | Freq: Every evening | ORAL | Status: DC | PRN

## 2022-08-17 MED ORDER — ACETAMINOPHEN 650 MG RE SUPP: 650.0000 mg | RECTAL | Status: DC | PRN

## 2022-08-17 MED ORDER — IOHEXOL 350 MG/ML SOLN
75.0000 mL | Freq: Once | INTRAVENOUS | Status: AC | PRN
  Administered 2022-08-17: 75 mL via INTRAVENOUS

## 2022-08-17 MED ORDER — ACETAMINOPHEN 160 MG/5ML PO SOLN: 650.0000 mg | ORAL | Status: DC | PRN

## 2022-08-17 MED ORDER — ACETAMINOPHEN 325 MG PO TABS: 650.0000 mg | ORAL_TABLET | ORAL | Status: DC | PRN

## 2022-08-17 MED ORDER — STROKE: EARLY STAGES OF RECOVERY BOOK
Freq: Once | Status: DC
  Filled 2022-08-17 (×2): qty 1

## 2022-08-17 MED ORDER — ASPIRIN 81 MG PO TBEC
81.0000 mg | DELAYED_RELEASE_TABLET | Freq: Every day | ORAL | Status: DC
  Administered 2022-08-17: 81 mg via ORAL
  Filled 2022-08-17: qty 1

## 2022-08-17 MED ORDER — ONDANSETRON HCL 4 MG/2ML IJ SOLN: 4.0000 mg | Freq: Four times a day (QID) | INTRAMUSCULAR | Status: DC | PRN

## 2022-08-17 MED ORDER — LEVOTHYROXINE SODIUM 112 MCG PO TABS
112.0000 ug | ORAL_TABLET | Freq: Every day | ORAL | Status: DC
  Administered 2022-08-18: 112 ug via ORAL
  Filled 2022-08-17: qty 1

## 2022-08-17 MED ORDER — ROSUVASTATIN CALCIUM 5 MG PO TABS
5.0000 mg | ORAL_TABLET | Freq: Every day | ORAL | Status: DC
  Administered 2022-08-17: 5 mg via ORAL
  Filled 2022-08-17: qty 1

## 2022-08-17 MED ORDER — ENOXAPARIN SODIUM 40 MG/0.4ML IJ SOSY: 40.0000 mg | PREFILLED_SYRINGE | INTRAMUSCULAR | Status: DC

## 2022-08-17 MED ORDER — HYDRALAZINE HCL 20 MG/ML IJ SOLN: 10.0000 mg | INTRAMUSCULAR | Status: DC | PRN

## 2022-08-17 MED ORDER — PANTOPRAZOLE SODIUM 20 MG PO TBEC
20.0000 mg | DELAYED_RELEASE_TABLET | Freq: Every day | ORAL | Status: DC
  Administered 2022-08-17: 20 mg via ORAL
  Filled 2022-08-17: qty 1

## 2022-08-17 NOTE — ED Notes (Signed)
[facility] called at this time for transport. 

## 2022-08-17 NOTE — ED Provider Triage Note (Signed)
Emergency Medicine Provider Triage Evaluation Note  Eric Case , a 77 y.o. male  was evaluated in triage.  Pt complains of trouble speaking.  Patient was moving a rock and came back around 1 PM and had some trouble speaking.  Last about 45 minutes.  Patient has previous history of cerebral hemorrhage with similar symptoms.  Patient had no head injury.  Patient is back to baseline now.  Review of Systems  Positive: Trouble speaking Negative: Focal weakness or numbness  Physical Exam  BP (!) 153/92 (BP Location: Left Arm)   Pulse 72   Temp (!) 97.4 F (36.3 C) (Oral)   Resp 17   Wt 89 kg   SpO2 97%   BMI 29.83 kg/m  Gen:   Awake, no distress    Resp:  Normal effort  MSK:   Moves extremities without difficulty  Other:  No obvious expressive aphasia and normal strength bilateral arms and legs  Medical Decision Making  Medically screening exam initiated at 3:42 PM.  Appropriate orders placed.  Eric Case was informed that the remainder of the evaluation will be completed by another provider, this initial triage assessment does not replace that evaluation, and the importance of remaining in the ED until their evaluation is complete.  NABIL CURLESS is a 77 y.o. male here presenting with trouble speaking.  Onset is 1 PM and has resolved.  He has a previous cerebral hemorrhage so does not qualify for tPA.  No code stroke was activated.  I ordered stroke order set as well as CT head.    Charlynne Pander, MD 08/17/22 [phone]

## 2022-08-17 NOTE — ED Notes (Signed)
[facility] here to take patient to [facility], patient taken out of ED via stretcher. All belongings taken with patient.

## 2022-08-17 NOTE — H&P (Signed)
History and Physical    NOLBERTO MCDADE DDU:[id number] DOB: 29-Aug-1945 DOA: 08/17/2022  PCP: Alysia Penna, MD  Patient coming from: Home  I have personally briefly reviewed patient's old medical records in [facility]  Chief Complaint: Speech abnormality  HPI: Eric Case is a 77 y.o. male with medical history significant for hypothyroidism, TIA, GERD, HLD who presented to the ED for evaluation of speech abnormality.  Symptoms began around 1 PM today and lasted for 45 minutes.  After lifting a large rock patient developed speech changes where he would say nonsensical sentences.  His wife was present and noted that his words did not make sense.  He he was able to understand her when she spoke to him but could not-express himself appropriately.  He did not have any facial droop, weakness in his extremities, change in sensation, or gait abnormality.  Patient states he had a similar episode last year.  He had an MRI of his brain December 2023 for transient aphasia.  There was no evidence of acute or subacute infarct.  Multiple scattered foci of hemosiderin deposition were noted with concern for interval hemorrhage from prior scan in September 2023.  There was concern for cerebral amyloid angiopathy.  He was seen by neurology clinic 06/16/2022.  He was recommended to continue aspirin 81 mg and rosuvastatin 5 mg daily for stroke prevention, patient reports adherence.  ED Course  Labs/Imaging on admission: I have personally reviewed following labs and imaging studies.  Initial vitals showed BP 153/92, pulse 72, RR 17, temp 97.4 F, SpO2 97% on room air.  Labs show WBC 9.0, hemoglobin 15.4, platelets 274,000, sodium 136, potassium 3.9, bicarb 21, BUN 23, creatinine 0.4, serum glucose 146, LFTs within normal limits, serum ethanol <10.  UDS negative.  CT head without contrast negative for acute intracranial findings.  EDP discussed with on-call neurology, Dr. Otelia Limes, who recommended  medical admission to [facility] for CVA workup.  The hospitalist service was consulted to admit for further evaluation and management.  Review of Systems: All systems reviewed and are negative except as documented in history of present illness above.   Past Medical History:  Diagnosis Date   Anemia    on Iron- comes and goes per patient    Arthritis    Cataract    starting but very small- RIGHT REMOVED   Complication of anesthesia    severe headache after esophagus stretched years ago   Diverticulitis    Diverticulosis    GERD (gastroesophageal reflux disease)    Hemorrhoids    Hiatal hernia    Hx of adenomatous colonic polyps    Hypothyroidism    Migraine, unspecified, not intractable, without status migrainosus    Peptic stricture of esophagus     Past Surgical History:  Procedure Laterality Date   ACHILLES TENDON SURGERY Right 03/19/2021   Procedure: Right Achilles tendon reconstruction with autograft FHL transfer to the calcaneus; gastroc recession;  Surgeon: [name], MD;  Location: [facility];  Service: Orthopedics;  Laterality: Right;    CATARACT EXTRACTION Right    COLONOSCOPY     KNEE SURGERY Right    tendon tear    MENISCUS REPAIR Right    2015 and May 2017   POLYPECTOMY     SHOULDER SURGERY     bilateral   TOTAL KNEE ARTHROPLASTY Right 04/02/2016   Procedure: RIGHT TOTAL KNEE ARTHROPLASTY;  Surgeon: Eugenia Mcalpine, MD;  Location: [facility];  Service: Orthopedics;  Laterality: Right;  Adductor Block   UPPER GASTROINTESTINAL ENDOSCOPY      Social History:  reports that he has never smoked. He has never used smokeless tobacco. He reports that he does not drink alcohol and does not use drugs.  Allergies  Allergen Reactions   Ciprofloxacin Other (See Comments)    chest pain   Diclofenac Sodium Other (See Comments)    "made me feel bad all over", Flu like symptoms/fever   Doxycycline Other (See Comments)   Hydromorphone Itching    Lithium Benzoate [Lithium]     Dizziness and Passed Out   Nsaids     Other reaction(s): Unknown   Oxycodone-Acetaminophen Itching    REACTION: itching   Pennsaid [Diclofenac Sodium] Other (See Comments)    "made me feel bad all over" Flu like symptoms/fever    Percocet [Oxycodone-Acetaminophen]     unknown   Sulfamethoxazole-Trimethoprim Rash    septra    Family History  Problem Relation Age of Onset   Breast cancer Mother    Heart disease Mother    Liver cancer Father    Prostate cancer Father    Prostate cancer Maternal Uncle    Prostate cancer Maternal Uncle    Prostate cancer Maternal Uncle    Colon cancer Cousin    Colon polyps Neg Hx    Esophageal cancer Neg Hx    Rectal cancer Neg Hx    Stomach cancer Neg Hx      Prior to Admission medications   Medication Sig Start Date End Date Taking? Authorizing Provider  Ascorbic Acid (VITAMIN C) 1000 MG tablet Take 1,000 mg by mouth daily.      [provider]  aspirin EC 81 MG tablet Take 81 mg by mouth daily. Swallow whole.    [provider]  co-enzyme Q-10 30 MG capsule Take 30 mg by mouth daily.     [provider]  lansoprazole (PREVACID) 15 MG capsule Take 15 mg by mouth daily. 07/07/20   [provider]  levothyroxine (SYNTHROID, LEVOTHROID) 112 MCG tablet Take 112 mcg by mouth daily.      [provider]  Multiple Vitamin (MULTIVITAMIN WITH MINERALS) TABS tablet Take 1 tablet by mouth daily.    [provider]  Nutritional Supplements (SALMON OIL) CAPS Take 1 capsule by mouth daily.    [provider]  rosuvastatin (CRESTOR) 5 MG tablet Take 5 mg by mouth daily. 03/17/22   [provider]  Saw Palmetto, Serenoa repens, (SAW PALMETTO PO) Take 250 mg by mouth 2 (two) times daily.     [provider]  vitamin E 400 UNIT capsule Take 400 Units by mouth daily.      [provider]    Physical Exam: Vitals:   08/17/22 1737  08/17/22 1842 08/17/22 1851 08/17/22 1930  BP: (!) 159/90  (!) 152/88 (!) 159/93  Pulse: 65  61 (!) 59  Resp: 18  20 19   Temp:  97.7 F (36.5 C)    TempSrc:  Oral    SpO2: 98%  100% 98%  Weight:       Constitutional: Resting in bed, NAD, calm, comfortable Eyes: PERRL, EOMI, lids and conjunctivae normal ENMT: Mucous membranes are moist. Posterior pharynx clear of any exudate or lesions.Normal dentition.  Neck: normal, supple, no masses. Respiratory: clear to auscultation bilaterally, no wheezing, no crackles. Normal respiratory effort. No accessory muscle use.  Cardiovascular: Regular rate and rhythm, no murmurs / rubs / gallops. No extremity  edema. 2+ pedal pulses. Abdomen: no tenderness, no masses palpated. No hepatosplenomegaly. Bowel sounds positive.  Musculoskeletal: no clubbing / cyanosis. No joint deformity upper and lower extremities. Good ROM, no contractures. Normal muscle tone.  Skin: no rashes, lesions, ulcers. No induration Neurologic: CN 2-12 grossly intact. Sensation intact. Strength 5/5 in all 4.  FTN intact.  No facial droop. Psychiatric: Alert and oriented x 3. Normal mood.   EKG: Personally reviewed. Sinus rhythm, rate 71, LAFB, no acute ischemic changes.  Similar to prior.  Assessment/Plan Principal Problem:   Expressive aphasia   Eric Case is a 77 y.o. male with medical history significant for hypothyroidism, TIA, GERD, HLD who presented with transient expressive aphasia and is admitted for CVA workup.  Assessment and Plan: Transient expressive aphasia: Admitted for TIA/CVA workup per neurology recommendation.  MRI brain 02/2022 showed changes concerning for cerebral amyloid angiopathy. -Admit to [facility] for stroke team eval -Obtain MRI brain without contrast -CTA head and neck -Aspirin 81 mg daily -PT/OT/SLP eval -Keep on telemetry, continue neurochecks -Check A1c and lipid panel -Continue rosuvastatin -Allow permissive hypertension for  now  Hypothyroidism: Continue Synthroid.  Hyperlipidemia: Continue rosuvastatin.   DVT prophylaxis: enoxaparin (LOVENOX) injection 40 mg Start: 08/18/22 2200 Code Status: Full code, confirmed with patient on admission Family Communication: Spouse at bedside Disposition Plan: From home and likely discharge to home pending clinical progress Consults called: Neurology Severity of Illness: The appropriate patient status for this patient is OBSERVATION. Observation status is judged to be reasonable and necessary in order to provide the required intensity of service to ensure the patient's safety. The patient's presenting symptoms, physical exam findings, and initial radiographic and laboratory data in the context of their medical condition is felt to place them at decreased risk for further clinical deterioration. Furthermore, it is anticipated that the patient will be medically stable for discharge from the hospital within 2 midnights of admission.   Darreld Mclean MD [facility]  If 7PM-7AM, please contact night-coverage [email or web address]  08/17/2022, 8:13 PM

## 2022-08-17 NOTE — Consult Note (Signed)
Neurology Consultation  Reason for Consult: TIA Referring Physician: Dr. Allena Katz  CC: Speech difficulty  History is obtained from: Patient, chart  HPI: Eric Case is a 77 y.o. male past medical history of migraine headaches, hypothyroidism, prior TIAs, presenting for evaluation of acute speech abnormalities. Symptom onset was 1 PM today-had word finding difficulty that lasted about 45 minutes and then completely resolved.  Has had similar symptoms in the past.  Prior MRIs concerning for extensive foci of hemosiderin in the brain MRI concerning for cerebral amyloid angiopathy. Neurology consulted for concern for TIA. Patient reports having had similar symptoms last year.  No recurrence of symptoms in the interim. Follows with Dr. Delena Bali at [facility] neurology for headaches. Concern for transient neurological spells associated with CA and increased risk of dementia discussed with the patient during those visits.  MoCA score 13/30 last visit.  LKW: 1 PM IV thrombolysis given?: no, several amyloid angiopathy and symptoms resolved Premorbid modified Rankin scale (mRS): Unable to reliably ascertain ROS: Full ROS was performed and is negative except as noted in the HPI.   Past Medical History:  Diagnosis Date   Anemia    on Iron- comes and goes per patient    Arthritis    Cataract    starting but very small- RIGHT REMOVED   Complication of anesthesia    severe headache after esophagus stretched years ago   Diverticulitis    Diverticulosis    GERD (gastroesophageal reflux disease)    Hemorrhoids    Hiatal hernia    Hx of adenomatous colonic polyps    Hypothyroidism    Migraine, unspecified, not intractable, without status migrainosus    Peptic stricture of esophagus    Family History  Problem Relation Age of Onset   Breast cancer Mother    Heart disease Mother    Liver cancer Father    Prostate cancer Father    Prostate cancer Maternal Uncle    Prostate cancer Maternal Uncle     Prostate cancer Maternal Uncle    Colon cancer Cousin    Colon polyps Neg Hx    Esophageal cancer Neg Hx    Rectal cancer Neg Hx    Stomach cancer Neg Hx    Social History:   reports that he has never smoked. He has never used smokeless tobacco. He reports that he does not drink alcohol and does not use drugs.  Medications  Current Facility-Administered Medications:    [START ON 08/18/2022]  stroke: early stages of recovery book, , Does not apply, Once, Charlsie Quest, MD   acetaminophen (TYLENOL) tablet 650 mg, 650 mg, Oral, Q4H PRN **OR** acetaminophen (TYLENOL) 160 MG/5ML solution 650 mg, 650 mg, Per Tube, Q4H PRN **OR** acetaminophen (TYLENOL) suppository 650 mg, 650 mg, Rectal, Q4H PRN, Charlsie Quest, MD   [START ON 08/18/2022] aspirin EC tablet 81 mg, 81 mg, Oral, Daily, Allena Katz, Floreen Comber, MD   [START ON 08/18/2022] enoxaparin (LOVENOX) injection 40 mg, 40 mg, Subcutaneous, Q24H, Patel, Vishal R, MD   hydrALAZINE (APRESOLINE) injection 10 mg, 10 mg, Intravenous, Q4H PRN, Charlsie Quest, MD   [START ON 08/18/2022] levothyroxine (SYNTHROID) tablet 112 mcg, 112 mcg, Oral, QAC breakfast, Patel, Vishal R, MD   ondansetron (ZOFRAN) injection 4 mg, 4 mg, Intravenous, Q6H PRN, Charlsie Quest, MD   [START ON 08/18/2022] pantoprazole (PROTONIX) EC tablet 20 mg, 20 mg, Oral, Daily, Charlsie Quest, MD   [START ON 08/18/2022] rosuvastatin (CRESTOR) tablet 5 mg, 5 mg,  Oral, Daily, Charlsie Quest, MD   senna-docusate (Senokot-S) tablet 1 tablet, 1 tablet, Oral, QHS PRN, Charlsie Quest, MD  Exam: Current vital signs: BP (!) 159/93   Pulse (!) 59   Temp 97.7 F (36.5 C) (Oral)   Resp 17   Wt 89 kg   SpO2 97%   BMI 29.83 kg/m  Vital signs in last 24 hours: Temp:  [97.4 F (36.3 C)-97.7 F (36.5 C)] 97.7 F (36.5 C) (05/28 1842) Pulse Rate:  [58-72] 59 (05/28 2045) Resp:  [17-20] 17 (05/28 2045) BP: (144-159)/(88-97) 159/93 (05/28 2045) SpO2:  [97 %-100 %] 97 % (05/28 2045) Weight:   [89 kg] 89 kg (05/28 1538) General: Awake alert in no distress HEENT: Normocephalic atraumatic Lungs: Clear Cardiovascular: Regular rhythm Abdomen nondistended nontender Extremities warm well-perfused Neurological exam Awake alert oriented x 3 No dysarthria No gross aphasia but has some hesitancy with certain words during long sentences Cranial nerve examination: 2-12 intact Motor examination with no drift Sensation intact to light touch Coordination exam with no dysmetria NIH stroke scale is 1   Labs I have reviewed labs in epic and the results pertinent to this consultation are:  CBC    Component Value Date/Time   WBC 9.0 08/17/2022 1558   RBC 5.42 08/17/2022 1558   HGB 16.0 08/17/2022 1603   HCT 47.0 08/17/2022 1603   PLT 274 08/17/2022 1558   MCV 87.5 08/17/2022 1558   MCH 28.4 08/17/2022 1558   MCHC 32.5 08/17/2022 1558   RDW 14.0 08/17/2022 1558   LYMPHSABS 2.0 08/17/2022 1558   MONOABS 0.7 08/17/2022 1558   EOSABS 0.3 08/17/2022 1558   BASOSABS 0.1 08/17/2022 1558    CMP     Component Value Date/Time   NA 138 08/17/2022 1603   K 4.1 08/17/2022 1603   CL 102 08/17/2022 1603   CO2 21 (L) 08/17/2022 1558   GLUCOSE 143 (H) 08/17/2022 1603   BUN 24 (H) 08/17/2022 1603   CREATININE 0.80 08/17/2022 1603   CALCIUM 9.0 08/17/2022 1558   PROT 7.3 08/17/2022 1558   ALBUMIN 4.1 08/17/2022 1558   AST 21 08/17/2022 1558   ALT 20 08/17/2022 1558   ALKPHOS 48 08/17/2022 1558   BILITOT 0.7 08/17/2022 1558   GFRNONAA >60 08/17/2022 1558   GFRAA >60 04/05/2016 0919     Imaging I have reviewed the images obtained: MRI brain with no acute intracranial process.  A new probable foci of hemosiderin deposition consistent with cerebral amyloid angiopathy.  Possible progression of the microhemorrhages.  Also advanced cerebral atrophy region ventriculomegaly more prominent on the left and right.  Superficial siderosis in bilateral frontal and parietal lobes.   Assessment:   77 year old with a history of chronic microhemorrhages on brain scans with concern for cerebral amyloid angiopathy presenting for 45-minute episode of word finding difficulty.  At baseline has some difficulty with speech. I think these episodes are transient focal neurological events associated with cerebral amyloid angiopathy rather than true TIAs. It is fine to do a stroke/TIA risk factor workup but I am not sure if that is going to yield much information to change management Follows with outpatient neurology-[facility]  Impression: Possible transient focal neurological events associated with cerebral amyloid angiopathy versus TIA--Likely the former   Recommendations: Frequent neurochecks Telemetry 2D echo, A1c, lipid panel. I would also do an EEG He is on aspirin at home-I would continue that with the caveat that it is high risk for causing cerebral hemorrhages and patient  with the innumerable microhemorrhages that he has on his brain.  This has been discussed with him during outpatient neurology visits and he and his wife are okay continuing it. I intensity statin for goal LDL less than 70 I would not do too much of permissive hypertension given her tendency of patients with CAA to have ICH.  I would keep his blood pressures in the normotensive range. PT OT speech therapy Outpatient follow-up with neurology in 8 to 12 weeks after discharge-established patient of [facility] We will follow   -- Milon Dikes, MD Neurologist [facility] Pager: [phone]

## 2022-08-17 NOTE — Hospital Course (Signed)
Eric Case is a 77 y.o. male with medical history significant for hypothyroidism, TIA, GERD, HLD who presented with transient expressive aphasia and is admitted for CVA workup.

## 2022-08-17 NOTE — ED Provider Notes (Signed)
Emergency Department Provider Note   I have reviewed the triage vital signs and the nursing notes.   HISTORY  Chief Complaint Aphasia   HPI Eric Case is a 77 y.o. male past history reviewed below including memory issues and prior TIA [facility] 2022) presents to the emergency department with episode of speech disturbance.  Symptoms began around 1 PM today and lasted for around 45 minutes.  The patient's wife, at bedside, states that he was saying words that were not making sense.  She noticed that this seems similar to what happened to him in 2022.  He was seen in the emergency department at that time and discharged home after MRI.  He was started on statin and a baby aspirin.  He is not on Plavix, the wife states, because of some micro-hemorrhages during his last scan.  No return of events is until today.  He denies headache or vision changes.  No unilateral weakness or numbness. Patient and wife both feel as if the patient is back at baseline.    Past Medical History:  Diagnosis Date   Anemia    on Iron- comes and goes per patient    Arthritis    Cataract    starting but very small- RIGHT REMOVED   Complication of anesthesia    severe headache after esophagus stretched years ago   Diverticulitis    Diverticulosis    GERD (gastroesophageal reflux disease)    Hemorrhoids    Hiatal hernia    Hx of adenomatous colonic polyps    Hypothyroidism    Migraine, unspecified, not intractable, without status migrainosus    Peptic stricture of esophagus     Review of Systems  Constitutional: No fever/chills Eyes: No visual changes. Cardiovascular: Denies chest pain. Respiratory: Denies shortness of breath. Gastrointestinal: No abdominal pain.  Genitourinary: Negative for dysuria. Musculoskeletal: Negative for back pain. Skin: Negative for rash. Neurological: Negative for headaches, focal weakness or numbness. Positive speech difficulty x 45 min today.     ____________________________________________   PHYSICAL EXAM:  VITAL SIGNS: ED Triage Vitals  Enc Vitals Group     BP 08/17/22 1532 (!) 153/92     Pulse Rate 08/17/22 1532 72     Resp 08/17/22 1532 17     Temp 08/17/22 1532 (!) 97.4 F (36.3 C)     Temp Source 08/17/22 1532 Oral     SpO2 08/17/22 1532 97 %     Weight 08/17/22 1538 196 lb 3.4 oz (89 kg)   Constitutional: Alert and oriented. Well appearing and in no acute distress. Eyes: Conjunctivae are normal. PERRL. EOMI. Head: Atraumatic. Nose: No congestion/rhinnorhea. Mouth/Throat: Mucous membranes are moist.   Neck: No stridor.   Cardiovascular: Normal rate, regular rhythm. Good peripheral circulation. Grossly normal heart sounds.   Respiratory: Normal respiratory effort.  No retractions. Lungs CTAB. Gastrointestinal: Soft and nontender. No distention.  Musculoskeletal: No lower extremity tenderness nor edema. No gross deformities of extremities. Neurologic:  Normal speech and language. No gross focal neurologic deficits are appreciated. No facial asymmetry. 5/5 strength in the bilateral upper and lower extremities. No pronator drift. Normal finger to nose.  Skin:  Skin is warm, dry and intact. No rash noted.  ____________________________________________   LABS (all labs ordered are listed, but only abnormal results are displayed)  Labs Reviewed  COMPREHENSIVE METABOLIC PANEL - Abnormal; Notable for the following components:      Result Value   CO2 21 (*)    Glucose, Bld  146 (*)    All other components within normal limits  URINALYSIS, ROUTINE W REFLEX MICROSCOPIC - Abnormal; Notable for the following components:   Hgb urine dipstick SMALL (*)    All other components within normal limits  I-STAT CHEM 8, ED - Abnormal; Notable for the following components:   BUN 24 (*)    Glucose, Bld 143 (*)    All other components within normal limits  ETHANOL  PROTIME-INR  APTT  CBC  DIFFERENTIAL  URINALYSIS, ROUTINE  W REFLEX MICROSCOPIC  RAPID URINE DRUG SCREEN, HOSP PERFORMED  LIPID PANEL  HEMOGLOBIN A1C  CBC  BASIC METABOLIC PANEL   ____________________________________________  EKG   EKG Interpretation  Date/Time:  Tuesday Aug 17 2022 16:03:54 EDT Ventricular Rate:  71 PR Interval:  187 QRS Duration: 108 QT Interval:  418 QTC Calculation: 455 R Axis:   -79 Text Interpretation: Sinus rhythm Left anterior fascicular block Abnormal R-wave progression, late transition Confirmed by Alona Bene [phone]) on 08/17/2022 5:33:23 PM        ____________________________________________  RADIOLOGY  MR BRAIN WO CONTRAST  Result Date: 08/17/2022 CLINICAL DATA:  Aphasia, stroke suspected EXAM: MRI HEAD WITHOUT CONTRAST TECHNIQUE: Multiplanar, multiecho pulse sequences of the brain and surrounding structures were obtained without intravenous contrast. COMPARISON:  03/10/2022 FINDINGS: Brain: No restricted diffusion to suggest acute or subacute infarct. No acute hemorrhage, mass, mass effect, or midline shift. No hydrocephalus or extra-axial collection. Normal craniocervical junction. Innumerable foci of hemosiderin deposition in the cerebral hemispheres, which are primarily peripheral, and cerebellar hemispheres. Many of these were not seen on the prior exam, although that may be due to differences in technique. In addition, superficial siderosis is noted in the bilateral frontal and parietal lobes, which was also less apparent the prior MRI, likely secondary to technique. Redemonstrated advanced cerebral atrophy for age and ventriculomegaly, slightly more prominent on the left than right. No definite lobar predominant. Confluent T2 hyperintense signal in the periventricular white matter, likely the sequela of severe chronic small vessel ischemic disease. , Vascular: Normal arterial flow voids. Skull and upper cervical spine: Normal marrow signal. Sinuses/Orbits: Clear paranasal sinuses. No acute finding in the  orbits. Status post right lens replacement. Other: Trace fluid in right mastoid air cells. IMPRESSION: 1. No acute intracranial process. No evidence of acute or subacute infarct. 2. Innumerable foci of hemosiderin deposition in the cerebral hemispheres and cerebellar hemispheres, which are primarily peripheral, and concerning for cerebral amyloid angiopathy. Many of these were not seen on the prior exam, although that may be due to differences in technique. 3. Redemonstrated advanced cerebral atrophy for age and ventriculomegaly, slightly more prominent on the left than right. No definite lobar predominant volume loss. 4. Superficial siderosis in the bilateral frontal and parietal lobes, which was also less apparent the prior MRI, likely secondary to technique. Electronically Signed   By: Wiliam Ke M.D.   On: 08/17/2022 20:25   CT ANGIO HEAD NECK W WO CM  Result Date: 08/17/2022 CLINICAL DATA:  TIA.  Trouble speaking EXAM: CT ANGIOGRAPHY HEAD AND NECK WITH AND WITHOUT CONTRAST TECHNIQUE: Multidetector CT imaging of the head and neck was performed using the standard protocol during bolus administration of intravenous contrast. Multiplanar CT image reconstructions and MIPs were obtained to evaluate the vascular anatomy. Carotid stenosis measurements (when applicable) are obtained utilizing NASCET criteria, using the distal internal carotid diameter as the denominator. RADIATION DOSE REDUCTION: This exam was performed according to the departmental dose-optimization program which includes automated exposure  control, adjustment of the mA and/or kV according to patient size and/or use of iterative reconstruction technique. CONTRAST:  75mL OMNIPAQUE IOHEXOL 350 MG/ML SOLN COMPARISON:  Head CT from earlier today FINDINGS: Motion artifact causes significant blurring of structures in the upper neck. Normalization by the skull base. This artifact includes nondiagnostic evaluation of the major arteries. CTA NECK  FINDINGS Aortic arch: Unremarkable Right carotid system: Limited atheromatous plaque at the bifurcation. No stenosis or ulceration seen. Left carotid system: Vessels are smoothly contoured and diffusely patent Vertebral arteries: No proximal subclavian stenosis. The vertebral arteries are smoothly contoured and widely patent where not distorted by artifact. Skeleton: Negative Other neck: Negative Upper chest: No acute finding Review of the MIP images confirms the above findings CTA HEAD FINDINGS Anterior circulation: No significant stenosis, proximal occlusion, aneurysm, or vascular malformation. Posterior circulation: No significant stenosis, proximal occlusion, aneurysm, or vascular malformation. Venous sinuses: As permitted by contrast timing, patent. Anatomic variants: Negative Review of the MIP images confirms the above findings IMPRESSION: 1. Segment of nondiagnostic assessment in the upper neck due to motion artifact. 2. No emergent vascular finding. Mild atherosclerosis without significant stenosis or irregularity of major arteries in the head and neck. Electronically Signed   By: Tiburcio Pea M.D.   On: 08/17/2022 19:34   CT HEAD WO CONTRAST  Result Date: 08/17/2022 CLINICAL DATA:  TIA, neuro deficit EXAM: CT HEAD WITHOUT CONTRAST TECHNIQUE: Contiguous axial images were obtained from the base of the skull through the vertex without intravenous contrast. RADIATION DOSE REDUCTION: This exam was performed according to the departmental dose-optimization program which includes automated exposure control, adjustment of the mA and/or kV according to patient size and/or use of iterative reconstruction technique. COMPARISON:  05/13/2022 FINDINGS: Brain: No acute intracranial findings are seen. There are no signs of bleeding within the cranium. There is prominence of the third and both lateral ventricles with no significant interval change. Cortical sulci are prominent. There is decreased density in  periventricular and subcortical white matter. Vascular: Scattered arterial calcifications are seen. Skull: No acute findings are seen. Sinuses/Orbits: Unremarkable. Other: No significant interval changes are noted. IMPRESSION: No acute intracranial findings are seen. Central and cortical atrophy. Small vessel disease. No significant interval changes are noted. Electronically Signed   By: Ernie Avena M.D.   On: 08/17/2022 16:03    ____________________________________________   PROCEDURES  Procedure(s) performed:   Procedures  None  ____________________________________________   INITIAL IMPRESSION / ASSESSMENT AND PLAN / ED COURSE  Pertinent labs & imaging results that were available during my care of the patient were reviewed by me and considered in my medical decision making (see chart for details).   This patient is Presenting for Evaluation of AMS, which does require a range of treatment options, and is a complaint that involves a high risk of morbidity and mortality.  The Differential Diagnoses includes but is not exclusive to alcohol, illicit or prescription medications, intracranial pathology such as stroke, intracerebral hemorrhage, fever or infectious causes including sepsis, hypoxemia, uremia, trauma, endocrine related disorders such as diabetes, hypoglycemia, thyroid-related diseases, etc.  I did obtain Additional Historical Information from wife at beside.   I decided to review pertinent External Data, and in summary unable to see records from last TIA in 2022 but able to see [facility] notes and MRI from 2023.  Carotid Dopplers performed in February 2024 with 1-39% stenosis bilaterally.    Clinical Laboratory Tests Ordered, included UA without infection.  CBC without anemia or leukocytosis.  No acute kidney injury.  EtOH negative.  Radiologic Tests Ordered, included CT head. I independently interpreted the images and agree with radiology interpretation.   Cardiac  Monitor Tracing which shows NSR.   Social Determinants of Health Risk patient is a non-smoker.   Consult complete with Neurology, Dr. Otelia Limes. Plan for admit to [facility] for TIA workup.   TRH. Plan for admit.   Medical Decision Making: Summary:  Patient presents emergency department with symptoms concerning for TIA.  He is currently neuro intact.  Had a similar episode in 2022 although I am unable to review these records in Care Everywhere.  MRI from September 2023 shows chronic cerebral microhemorrhages, likely associated with small vessel ischemic disease.   Reevaluation with update and discussion with patient and wife. Plan for admit for TIA workup.   Patient's presentation is most consistent with acute presentation with potential threat to life or bodily function.   Disposition: admit  ____________________________________________  FINAL CLINICAL IMPRESSION(S) / ED DIAGNOSES  Final diagnoses:  TIA (transient ischemic attack)    Note:  This document was prepared using Dragon voice recognition software and may include unintentional dictation errors.  Alona Bene, MD, [facility]    Reilyn Nelson, Arlyss Repress, MD 08/17/22 2213

## 2022-08-17 NOTE — ED Notes (Signed)
Patient taken to MRI

## 2022-08-17 NOTE — ED Triage Notes (Signed)
Pt reports unable to make complete sentence x45 mins.  Pt reports picking up a big rock prior to the aphasia.  No weakness or facial droop noted A&O x4.  LKN 1300.  Symptoms have resolved.

## 2022-08-17 NOTE — ED Notes (Signed)
MD aware of patient complaints.  

## 2022-08-18 ENCOUNTER — Observation Stay (HOSPITAL_BASED_OUTPATIENT_CLINIC_OR_DEPARTMENT_OTHER): Payer: Medicare Other

## 2022-08-18 ENCOUNTER — Observation Stay (HOSPITAL_COMMUNITY): Payer: Medicare Other

## 2022-08-18 DIAGNOSIS — R4701 Aphasia: Secondary | ICD-10-CM | POA: Diagnosis not present

## 2022-08-18 DIAGNOSIS — G459 Transient cerebral ischemic attack, unspecified: Secondary | ICD-10-CM

## 2022-08-18 DIAGNOSIS — I680 Cerebral amyloid angiopathy: Secondary | ICD-10-CM | POA: Diagnosis not present

## 2022-08-18 DIAGNOSIS — R569 Unspecified convulsions: Secondary | ICD-10-CM | POA: Diagnosis not present

## 2022-08-18 LAB — ECHOCARDIOGRAM COMPLETE
AR max vel: 1.53 cm2
AV Area VTI: 1.68 cm2
AV Area mean vel: 1.54 cm2
AV Mean grad: 4 mmHg
AV Peak grad: 7 mmHg
Ao pk vel: 1.32 m/s
Area-P 1/2: 3.39 cm2
Calc EF: 52.1 %
MV VTI: 2.26 cm2
S' Lateral: 3.5 cm
Single Plane A2C EF: 49.1 %
Single Plane A4C EF: 54 %
Weight: 3139.35 oz

## 2022-08-18 LAB — BASIC METABOLIC PANEL
Anion gap: 8 (ref 5–15)
BUN: 15 mg/dL (ref 8–23)
CO2: 26 mmol/L (ref 22–32)
Calcium: 8.9 mg/dL (ref 8.9–10.3)
Chloride: 104 mmol/L (ref 98–111)
Creatinine, Ser: 0.84 mg/dL (ref 0.61–1.24)
GFR, Estimated: >60 mL/min (ref >60)
Glucose, Bld: 77 mg/dL (ref 70–99)
Potassium: 3.5 mmol/L (ref 3.5–5.1)
Sodium: 138 mmol/L (ref 135–145)

## 2022-08-18 LAB — LIPID PANEL
Cholesterol: 111 mg/dL (ref 0–200)
HDL: 46 mg/dL (ref >40)
LDL Cholesterol: 55 mg/dL (ref 0–99)
Total CHOL/HDL Ratio: 2.4 RATIO
Triglycerides: 48 mg/dL (ref <150)
VLDL: 10 mg/dL (ref 0–40)

## 2022-08-18 LAB — CBC
HCT: 46.4 % (ref 39.0–52.0)
Hemoglobin: 15.3 g/dL (ref 13.0–17.0)
MCH: 28.5 pg (ref 26.0–34.0)
MCHC: 33 g/dL (ref 30.0–36.0)
MCV: 86.6 fL (ref 80.0–100.0)
Platelets: 268 10*3/uL (ref 150–400)
RBC: 5.36 MIL/uL (ref 4.22–5.81)
RDW: 13.9 % (ref 11.5–15.5)
WBC: 9.3 10*3/uL (ref 4.0–10.5)
nRBC: 0 % (ref 0.0–0.2)

## 2022-08-18 NOTE — Progress Notes (Signed)
EEG complete - results pending 

## 2022-08-18 NOTE — Evaluation (Signed)
Physical Therapy Evaluation Patient Details Name: Eric Case MRN: [id number] DOB: 1945-07-16 Today's Date: 08/18/2022  History of Present Illness  77 y/o M admitted to [facility] on 5/28 for speech abnormality. Pt had a 45 minutes episode of word finding and expressive aphasia that resolved. MRI with no acute intracranial process. PMHx: hypothyroidism, TIA, GERD, HLD  Clinical Impression  Pt presents today functioning close to his mobility baseline. Pt's wife present throughout session, both reporting pt seeming close or at his baseline. Pt with cognitive deficits at baseline, unable to state year or name of the hospital but wife reporting that has been normal for him recently. Pt ambulating with supervision, mild path deviation noted but no overt LOB with ambulation, no AD, and no reaching out for UE support. Pt able to ascend/descend steps with use of the rail and supervision for safety and line management, pt reports going down to his basement to exercise most days. Educated pt on easing back into activity once home, pt and his wife report no concerns with mobility. Acute PT will sign off, no current need for PT upon discharge.      Recommendations for follow up therapy are one component of a multi-disciplinary discharge planning process, led by the attending physician.  Recommendations may be updated based on patient status, additional functional criteria and insurance authorization.  Follow Up Recommendations       Assistance Recommended at Discharge Intermittent Supervision/Assistance  Patient can return home with the following  Help with stairs or ramp for entrance;Assist for transportation;Direct supervision/assist for medications management    Equipment Recommendations None recommended by PT  Recommendations for Other Services       Functional Status Assessment Patient has had a recent decline in their functional status and demonstrates the ability to make significant improvements in  function in a reasonable and predictable amount of time.     Precautions / Restrictions Precautions Precautions: Fall Restrictions Weight Bearing Restrictions: No      Mobility  Bed Mobility Overal bed mobility: Needs Assistance Bed Mobility: Supine to Sit, Sit to Supine     Supine to sit: HOB elevated, Modified independent (Device/Increase time) Sit to supine: Modified independent (Device/Increase time), HOB elevated   General bed mobility comments: slightly increased time and use of bed rails    Transfers Overall transfer level: Modified independent Equipment used: None Transfers: Sit to/from Stand Sit to Stand: Modified independent (Device/Increase time)                Ambulation/Gait Ambulation/Gait assistance: Supervision Gait Distance (Feet): 250 Feet Assistive device: None Gait Pattern/deviations: Step-through pattern, Wide base of support Gait velocity: grossly [facility]     General Gait Details: supervision for line management and safety, mild path deviation. increased time with posterior steps with 270 degree turn, mild LOB but pt self correcting  Stairs Stairs: Yes Stairs assistance: Supervision Stair Management: Alternating pattern, Step to pattern, Forwards, One rail Left Number of Stairs: 11 General stair comments: ascending alternating pattern, descending step to pattern progressing to alternating pattern  Wheelchair Mobility    Modified Rankin (Stroke Patients Only)       Balance Overall balance assessment: Needs assistance Sitting-balance support: No upper extremity supported, Feet supported Sitting balance-Leahy Scale: Good     Standing balance support: During functional activity, No upper extremity supported Standing balance-Leahy Scale: Fair  Pertinent Vitals/Pain Pain Assessment Pain Assessment: No/denies pain    Home Living Family/patient expects to be discharged to:: Private  residence Living Arrangements: Spouse/significant other Available Help at Discharge: Family;Available 24 hours/day Type of Home: House Home Access: Stairs to enter Entrance Stairs-Rails: None Entrance Stairs-Number of Steps: 2   Home Layout: One level Home Equipment: Agricultural consultant (2 wheels);Shower seat;Grab bars - toilet;Grab bars - tub/shower;Hand held shower head      Prior Function Prior Level of Function : Independent/Modified Independent;Driving             Mobility Comments: independent with all mobility, hikes daily and works out in his home gym ADLs Comments: independent with bathing and dressing, wife sets up medication in pill box     Hand Dominance   Dominant Hand: Right    Extremity/Trunk Assessment   Upper Extremity Assessment Upper Extremity Assessment: Defer to OT evaluation    Lower Extremity Assessment Lower Extremity Assessment: Overall WFL for tasks assessed (no sensation or coordination deficits noted)       Communication   Communication: No difficulties (pt and family reporting expressive difficulties resolved)  Cognition Arousal/Alertness: Awake/alert Behavior During Therapy: WFL for tasks assessed/performed Overall Cognitive Status: History of cognitive impairments - at baseline                                 General Comments: Pt's wife reports memory difficulty, pt able to state his name and birthday and wife's name, difficulty recalling year but able to state month, unable to name hospital, wife reporting this has been his recent baseline. Pt following commands with verbal cues        General Comments General comments (skin integrity, edema, etc.): VSS on room air    Exercises     Assessment/Plan    PT Assessment Patient does not need any further PT services  PT Problem List         PT Treatment Interventions      PT Goals (Current goals can be found in the Care Plan section)  Acute Rehab PT Goals Patient  Stated Goal: go home PT Goal Formulation: All assessment and education complete, DC therapy    Frequency       Co-evaluation               AM-PAC PT "6 Clicks" Mobility  Outcome Measure Help needed turning from your back to your side while in a flat bed without using bedrails?: None Help needed moving from lying on your back to sitting on the side of a flat bed without using bedrails?: None Help needed moving to and from a bed to a chair (including a wheelchair)?: None Help needed standing up from a chair using your arms (e.g., wheelchair or bedside chair)?: None Help needed to walk in hospital room?: A Little Help needed climbing 3-5 steps with a railing? : A Little 6 Click Score: 22    End of Session Equipment Utilized During Treatment: Gait belt Activity Tolerance: Patient tolerated treatment well Patient left: in bed;with call bell/phone within reach;with family/visitor present Nurse Communication: Mobility status PT Visit Diagnosis: Other abnormalities of gait and mobility (R26.89)    Time: 1610-9604 PT Time Calculation (min) (ACUTE ONLY): 15 min   Charges:   PT Evaluation $PT Eval Low Complexity: 1 Low          Lindalou Hose, PT DPT Acute Rehabilitation Services Office [phone]  Leonie Man 08/18/2022, 1:42 PM

## 2022-08-18 NOTE — Evaluation (Signed)
Occupational Therapy Evaluation Patient Details Name: Eric Case MRN: [id number] DOB: [birth year] Today's Date: 08/18/2022   History of Present Illness 77 y/o M admitted to [facility] on 5/28 for speech abnormality. Pt had a 45 minutes episode of word finding and expressive aphasia that resolved. MRI with no acute intracranial process. PMHx: hypothyroidism, TIA, GERD, HLD   Clinical Impression   PTA, pt lived with his wife who assists with medication management and finances. Upon eval, pt with limitations in memory and problem solving, however, pt and wife reporting this is his baseline. Pt performing BADL with mod I. Offered education regarding compensatory techniques for memory and problem solving, but pt and wife reporting they have been successful in self management and wife able to continue assisting with IADL. Due to pt and wife report of being at functional baseline and pt not requiring assist during ADL, Acute OT to sign off.      Recommendations for follow up therapy are one component of a multi-disciplinary discharge planning process, led by the attending physician.  Recommendations may be updated based on patient status, additional functional criteria and insurance authorization.   Assistance Recommended at Discharge Intermittent Supervision/Assistance  Patient can return home with the following Assist for transportation;Help with stairs or ramp for entrance;Assistance with cooking/housework;Direct supervision/assist for medications management;Direct supervision/assist for financial management    Functional Status Assessment  Patient has had a recent decline in their functional status and demonstrates the ability to make significant improvements in function in a reasonable and predictable amount of time.  Equipment Recommendations  None recommended by OT    Recommendations for Other Services       Precautions / Restrictions Precautions Precautions: Fall Restrictions Weight Bearing  Restrictions: No      Mobility Bed Mobility Overal bed mobility: Needs Assistance Bed Mobility: Supine to Sit, Sit to Supine     Supine to sit: HOB elevated, Modified independent (Device/Increase time) Sit to supine: Modified independent (Device/Increase time), HOB elevated   General bed mobility comments: slightly increased time and use of bed rails    Transfers Overall transfer level: Modified independent Equipment used: None Transfers: Sit to/from Stand Sit to Stand: Modified independent (Device/Increase time)                  Balance Overall balance assessment: Needs assistance Sitting-balance support: No upper extremity supported, Feet supported Sitting balance-Leahy Scale: Good     Standing balance support: During functional activity, No upper extremity supported Standing balance-Leahy Scale: Fair                             ADL either performed or assessed with clinical judgement   ADL Overall ADL's : Modified independent                                       General ADL Comments: Mod I for increased time. Wife assisting with medication management and keeping up with appointments due to decr memory at baseline. Aware of compensatory techniques, but they have chosen for wife to provide assist often over use of compensatory strategies.     Vision Baseline Vision/History: 0 No visual deficits Ability to See in Adequate Light: 0 Adequate Patient Visual Report: No change from baseline Vision Assessment?: No apparent visual deficits     Perception     Praxis  Pertinent Vitals/Pain       Hand Dominance Right   Extremity/Trunk Assessment Upper Extremity Assessment Upper Extremity Assessment: Overall WFL for tasks assessed (functionally)   Lower Extremity Assessment Lower Extremity Assessment: Defer to PT evaluation       Communication Communication Communication: No difficulties (pt and family reporting expressive  difficulties resolved)   Cognition Arousal/Alertness: Awake/alert Behavior During Therapy: WFL for tasks assessed/performed Overall Cognitive Status: History of cognitive impairments - at baseline                                 General Comments: Pt's wife reports memory difficulty, pt able to state his name and birthday and wife's name, difficulty recalling year but able to state month, unable to name hospital, wife reporting this has been his recent baseline. Pt following commands with verbal cues. able to answer simple questions regarding pill box use after set-up  (how do you know if you have already taken your medication)     General Comments  VSS    Exercises     Shoulder Instructions      Home Living Family/patient expects to be discharged to:: Private residence Living Arrangements: Spouse/significant other Available Help at Discharge: Family;Available 24 hours/day Type of Home: House Home Access: Stairs to enter [employer] of Steps: 2 Entrance Stairs-Rails: None Home Layout: One level     Bathroom Shower/Tub: Producer, television/film/video: Standard     Home Equipment: Agricultural consultant (2 wheels);Shower seat;Grab bars - toilet;Grab bars - tub/shower;Hand held shower head          Prior Functioning/Environment Prior Level of Function : Independent/Modified Independent;Driving             Mobility Comments: independent with all mobility, hikes daily and works out in his home gym ADLs Comments: independent with bathing and dressing, wife sets up medication in pill box        OT Problem List: Decreased strength;Decreased activity tolerance;Impaired balance (sitting and/or standing)      OT Treatment/Interventions:      OT Goals(Current goals can be found in the care plan section) Acute Rehab OT Goals Patient Stated Goal: go home OT Goal Formulation: With patient Time For Goal Achievement: 09/01/22 Potential to Achieve Goals:  Good  OT Frequency:      Co-evaluation              AM-PAC OT "6 Clicks" Daily Activity     Outcome Measure Help from another person eating meals?: None Help from another person taking care of personal grooming?: None Help from another person toileting, which includes using toliet, bedpan, or urinal?: None Help from another person bathing (including washing, rinsing, drying)?: None Help from another person to put on and taking off regular upper body clothing?: None Help from another person to put on and taking off regular lower body clothing?: None 6 Click Score: 24   End of Session Equipment Utilized During Treatment: Gait belt Nurse Communication: Mobility status  Activity Tolerance: Patient tolerated treatment well Patient left: in bed;with call bell/phone within reach;with bed alarm set  OT Visit Diagnosis: Unsteadiness on feet (R26.81);Muscle weakness (generalized) (M62.81);Other symptoms and signs involving cognitive function                Time: 9604-5409 OT Time Calculation (min): 22 min Charges:  OT General Charges $OT Visit: 1 Visit OT Evaluation $OT Eval Low Complexity: 1 Low  Tyler Deis, OTR/L [facility] Acute Rehabilitation Office: [phone]  Myrla Halsted 08/18/2022, 1:47 PM

## 2022-08-18 NOTE — Discharge Summary (Signed)
PATIENT DETAILS Name: Eric Case Age: 77 y.o. Sex: male Date of Birth: 30-Aug-1945 MRN: [id number]. Admitting Physician: Charlsie Quest, MD WUJ:[id number], Lorin Picket, MD  Admit Date: 08/17/2022 Discharge date: 08/18/2022  Recommendations for Outpatient Follow-up:  Follow up with PCP in 1-2 weeks Please obtain CMP/CBC in one week AIC pending-please follow  Admitted From:  Home  Disposition: Home   Discharge Condition: good  CODE STATUS:   Code Status: Full Code   Diet recommendation:  Diet Order             Diet - low sodium heart healthy           Diet Heart Room service appropriate? Yes; Fluid consistency: Thin  Diet effective now                    Brief Summary: Eric Case is a 77 y.o. male with medical history significant for hypothyroidism, TIA, GERD, HLD who presented with transient expressive aphasia and is admitted for CVA workup.  Brief Hospital Course: TIA versus amyloid spell Aphasia has resolved-back to baseline. MRI brain neg, CTA head/neck neg for stenosis, TTE stable. EEG no seizures Discussed with stroke MD-Dr Xu-felt to be amyloid spell Recommendations are to hold aspirin/statin Follow-up with outpatient neurologist.  Hypothyroid Synthroid  HLD Holding statin at the recommendation of stroke MD/neurology  Obesity: Estimated body mass index is 29.83 kg/m as calculated from the following:   Height as of 06/16/22: 5\' 8"  (1.727 m).   Weight as of this encounter: 89 kg.   Discharge Diagnoses:  Principal Problem:   Expressive aphasia   Discharge Instructions:  Activity:  As tolerated with Full fall precautions use walker/cane & assistance as needed  Discharge Instructions     Diet - low sodium heart healthy   Complete by: As directed    Discharge instructions   Complete by: As directed    Follow with Primary MD  Alysia Penna, MD in 1-2 weeks  Follow with your primary Neurologist in 1-2 weeks  Please get a complete  blood count and chemistry panel checked by your Primary MD at your next visit, and again as instructed by your Primary MD.  Get Medicines reviewed and adjusted: Please take all your medications with you for your next visit with your Primary MD  Laboratory/radiological data: Please request your Primary MD to go over all hospital tests and procedure/radiological results at the follow up, please ask your Primary MD to get all Hospital records sent to his/her office.  In some cases, they will be blood work, cultures and biopsy results pending at the time of your discharge. Please request that your primary care M.D. follows up on these results.  Also Note the following: If you experience worsening of your admission symptoms, develop shortness of breath, life threatening emergency, suicidal or homicidal thoughts you must seek medical attention immediately by calling 911 or calling your MD immediately  if symptoms less severe.  You must read complete instructions/literature along with all the possible adverse reactions/side effects for all the Medicines you take and that have been prescribed to you. Take any new Medicines after you have completely understood and accpet all the possible adverse reactions/side effects.   Do not drive when taking Pain medications or sleeping medications (Benzodaizepines)  Do not take more than prescribed Pain, Sleep and Anxiety Medications. It is not advisable to combine anxiety,sleep and pain medications without talking with your primary care practitioner  Special Instructions: If you have  smoked or chewed Tobacco  in the last 2 yrs please stop smoking, stop any regular Alcohol  and or any Recreational drug use.  Wear Seat belts while driving.  Please note: You were cared for by a hospitalist during your hospital stay. Once you are discharged, your primary care physician will handle any further medical issues. Please note that NO REFILLS for any discharge medications  will be authorized once you are discharged, as it is imperative that you return to your primary care physician (or establish a relationship with a primary care physician if you do not have one) for your post hospital discharge needs so that they can reassess your need for medications and monitor your lab values.   Increase activity slowly   Complete by: As directed       Allergies as of 08/18/2022       Reactions   Ciprofloxacin Other (See Comments)   chest pain   Diclofenac Sodium Other (See Comments)   "made me feel bad all over", Flu like symptoms/fever   Doxycycline Other (See Comments)   Hydromorphone Itching   Lithium Benzoate [lithium]    Dizziness and Passed Out   Nsaids    Other reaction(s): Unknown   Oxycodone-acetaminophen Itching   REACTION: itching   Pennsaid [diclofenac Sodium] Other (See Comments)   "made me feel bad all over" Flu like symptoms/fever   Percocet [oxycodone-acetaminophen]    unknown   Sulfamethoxazole-trimethoprim Rash   septra        Medication List     STOP taking these medications    aspirin EC 81 MG tablet   rosuvastatin 5 MG tablet Commonly known as: CRESTOR       TAKE these medications    co-enzyme Q-10 30 MG capsule Take 30 mg by mouth daily.   lansoprazole 15 MG capsule Commonly known as: PREVACID Take 15 mg by mouth daily.   levothyroxine 112 MCG tablet Commonly known as: SYNTHROID Take 112 mcg by mouth daily.   multivitamin with minerals Tabs tablet Take 1 tablet by mouth daily.   Salmon Oil Caps Take 1 capsule by mouth daily.   SAW PALMETTO PO Take 250 mg by mouth 2 (two) times daily.   vitamin C 1000 MG tablet Take 1,000 mg by mouth daily.   vitamin E 180 MG (400 UNITS) capsule Take 400 Units by mouth daily.        Follow-up Information     Alysia Penna, MD. Schedule an appointment as soon as possible for a visit in 1 week(s).   Specialty: Internal Medicine Contact information: [address] [place] [address] [phone]         Ocie Doyne, MD. Schedule an appointment as soon as possible for a visit in 2 week(s).   Specialty: Neurology Contact information: [address], [address] [place] [address] [phone]                Allergies  Allergen Reactions   Ciprofloxacin Other (See Comments)    chest pain   Diclofenac Sodium Other (See Comments)    "made me feel bad all over", Flu like symptoms/fever   Doxycycline Other (See Comments)   Hydromorphone Itching   Lithium Benzoate [Lithium]     Dizziness and Passed Out   Nsaids     Other reaction(s): Unknown   Oxycodone-Acetaminophen Itching    REACTION: itching   Pennsaid [Diclofenac Sodium] Other (See Comments)    "made me feel bad all over" Flu like  symptoms/fever    Percocet [Oxycodone-Acetaminophen]     unknown   Sulfamethoxazole-Trimethoprim Rash    septra     Other Procedures/Studies: EEG adult  Result Date: Aug 24, 2022 Charlsie Quest, MD     08-24-22  4:16 PM Patient Name: Eric Case MRN: [id number] Epilepsy Attending: Charlsie Quest Referring Physician/Provider: Marvel Plan, MD Date: 08/24/2022 Duration: 23.16 mins Patient history: 77 y.o. male past medical history of migraine headaches, hypothyroidism, prior TIAs, presenting for evaluation of acute speech abnormalities. Symptom onset was 1 PM today-had word finding difficulty that lasted about 45 minutes and then completely resolved.  EEG to evaluate for seizure Level of alertness: Awake AEDs during EEG study: None Technical aspects: This EEG study was done with scalp electrodes positioned according to the 10-20 International system of electrode placement. Electrical activity was reviewed with band pass filter of 1-70Hz , sensitivity of 7 uV/mm, display speed of 29mm/sec with a 60Hz  notched filter applied as appropriate. EEG data were recorded continuously and digitally stored.  Video monitoring was available and  reviewed as appropriate. Description: The posterior dominant rhythm consists of 8 Hz activity of moderate voltage (25-35 uV) seen predominantly in posterior head regions, symmetric and reactive to eye opening and eye closing.  Hyperventilation and photic stimulation were not performed.   IMPRESSION: This study is within normal limits. No seizures or epileptiform discharges were seen throughout the recording. A normal interictal EEG does not exclude the diagnosis of epilepsy. Charlsie Quest   ECHOCARDIOGRAM COMPLETE  Result Date: 2022/08/24    ECHOCARDIOGRAM REPORT   Patient Name:   Eric Case Date of Exam: August 24, 2022 Medical Rec #:  [id number]      Height:       68.0 in Accession #:    [id number]     Weight:       196.2 lb Date of Birth:  29-Sep-1945      BSA:          2.027 m Patient Age:    76 years       BP:           125/82 mmHg Patient Gender: M              HR:           73 bpm. Exam Location:  Inpatient Procedure: 2D Echo, Cardiac Doppler and Color Doppler Indications:    TIA  History:        Patient has prior history of Echocardiogram examinations, most                 recent 03/11/2022. TIA, Arrythmias:Atrial Fibrillation;                 Signs/Symptoms:Syncope.  Sonographer:    Wallie Char Referring Phys: Sharla Kidney Werner Lean Aubrie Lucien IMPRESSIONS  1. Left ventricular ejection fraction, by estimation, is 50 to 55%. The left ventricle has low normal function. The left ventricle has no regional wall motion abnormalities. Left ventricular diastolic parameters are consistent with Grade I diastolic dysfunction (impaired relaxation).  2. Right ventricular systolic function is normal. The right ventricular size is normal. There is normal pulmonary artery systolic pressure.  3. The mitral valve is grossly normal. No evidence of mitral valve regurgitation. No evidence of mitral stenosis.  4. The aortic valve was not well visualized. Aortic valve regurgitation is not visualized. No aortic stenosis is present.  5.  Aortic dilatation noted. There is mild dilatation of the ascending aorta, measuring 40 mm.  6. The inferior vena cava is normal in size with greater than 50% respiratory variability, suggesting right atrial pressure of 3 mmHg. Comparison(s): Similar to prior prior reporting. FINDINGS  Left Ventricle: Left ventricular ejection fraction, by estimation, is 50 to 55%. The left ventricle has low normal function. The left ventricle has no regional wall motion abnormalities. The left ventricular internal cavity size was normal in size. There is no left ventricular hypertrophy. Left ventricular diastolic parameters are consistent with Grade I diastolic dysfunction (impaired relaxation). Right Ventricle: The right ventricular size is normal. No increase in right ventricular wall thickness. Right ventricular systolic function is normal. There is normal pulmonary artery systolic pressure. The tricuspid regurgitant velocity is 2.08 m/s, and  with an assumed right atrial pressure of 3 mmHg, the estimated right ventricular systolic pressure is 20.3 mmHg. Left Atrium: Left atrial size was normal in size. Right Atrium: Right atrial size was normal in size. Pericardium: There is no evidence of pericardial effusion. Mitral Valve: The mitral valve is grossly normal. No evidence of mitral valve regurgitation. No evidence of mitral valve stenosis. MV peak gradient, 2.2 mmHg. The mean mitral valve gradient is 1.0 mmHg. Tricuspid Valve: The tricuspid valve is normal in structure. Tricuspid valve regurgitation is not demonstrated. No evidence of tricuspid stenosis. Aortic Valve: The aortic valve was not well visualized. Aortic valve regurgitation is not visualized. No aortic stenosis is present. Aortic valve mean gradient measures 4.0 mmHg. Aortic valve peak gradient measures 7.0 mmHg. Aortic valve area, by VTI measures 1.68 cm. Pulmonic Valve: The pulmonic valve was normal in structure. Pulmonic valve regurgitation is mild. No evidence  of pulmonic stenosis. Aorta: Aortic dilatation noted. There is mild dilatation of the ascending aorta, measuring 40 mm. Venous: The inferior vena cava is normal in size with greater than 50% respiratory variability, suggesting right atrial pressure of 3 mmHg. IAS/Shunts: No atrial level shunt detected by color flow Doppler.  LEFT VENTRICLE PLAX 2D LVIDd:         5.00 cm     Diastology LVIDs:         3.50 cm     LV e' medial:    4.72 cm/s LV PW:         1.10 cm     LV E/e' medial:  11.2 LV IVS:        1.00 cm     LV e' lateral:   10.90 cm/s LVOT diam:     1.70 cm     LV E/e' lateral: 4.9 LV SV:         39 LV SV Index:   19 LVOT Area:     2.27 cm  LV Volumes (MOD) LV vol d, MOD A2C: 98.7 ml LV vol d, MOD A4C: 97.8 ml LV vol s, MOD A2C: 50.2 ml LV vol s, MOD A4C: 45.0 ml LV SV MOD A2C:     48.5 ml LV SV MOD A4C:     97.8 ml LV SV MOD BP:      51.6 ml RIGHT VENTRICLE             IVC RV S prime:     12.40 cm/s  IVC diam: 1.50 cm TAPSE (M-mode): 2.1 cm LEFT ATRIUM             Index LA diam:        3.10 cm 1.53 cm/m LA Vol (A2C):   44.3 ml 21.85 ml/m LA Vol (A4C):   59.7 ml 29.45 ml/m  LA Biplane Vol: 53.2 ml 26.24 ml/m  AORTIC VALVE AV Area (Vmax):    1.53 cm AV Area (Vmean):   1.54 cm AV Area (VTI):     1.68 cm AV Vmax:           132.00 cm/s AV Vmean:          89.800 cm/s AV VTI:            0.234 m AV Peak Grad:      7.0 mmHg AV Mean Grad:      4.0 mmHg LVOT Vmax:         89.10 cm/s LVOT Vmean:        60.800 cm/s LVOT VTI:          0.173 m LVOT/AV VTI ratio: 0.74  AORTA Ao Root diam: 3.60 cm Ao Asc diam:  4.00 cm MITRAL VALVE               TRICUSPID VALVE MV Area (PHT): 3.39 cm    TR Peak grad:   17.3 mmHg MV Area VTI:   2.26 cm    TR Vmax:        208.00 cm/s MV Peak grad:  2.2 mmHg MV Mean grad:  1.0 mmHg    SHUNTS MV Vmax:       0.74 m/s    Systemic VTI:  0.17 m MV Vmean:      45.7 cm/s   Systemic Diam: 1.70 cm MV Decel Time: 224 msec MV E velocity: 53.10 cm/s MV A velocity: 81.10 cm/s MV E/A ratio:  0.65  Riley Lam MD Electronically signed by Riley Lam MD Signature Date/Time: 08/18/2022/3:03:55 PM    Final    MR BRAIN WO CONTRAST  Result Date: 08/17/2022 CLINICAL DATA:  Aphasia, stroke suspected EXAM: MRI HEAD WITHOUT CONTRAST TECHNIQUE: Multiplanar, multiecho pulse sequences of the brain and surrounding structures were obtained without intravenous contrast. COMPARISON:  03/10/2022 FINDINGS: Brain: No restricted diffusion to suggest acute or subacute infarct. No acute hemorrhage, mass, mass effect, or midline shift. No hydrocephalus or extra-axial collection. Normal craniocervical junction. Innumerable foci of hemosiderin deposition in the cerebral hemispheres, which are primarily peripheral, and cerebellar hemispheres. Many of these were not seen on the prior exam, although that may be due to differences in technique. In addition, superficial siderosis is noted in the bilateral frontal and parietal lobes, which was also less apparent the prior MRI, likely secondary to technique. Redemonstrated advanced cerebral atrophy for age and ventriculomegaly, slightly more prominent on the left than right. No definite lobar predominant. Confluent T2 hyperintense signal in the periventricular white matter, likely the sequela of severe chronic small vessel ischemic disease. , Vascular: Normal arterial flow voids. Skull and upper cervical spine: Normal marrow signal. Sinuses/Orbits: Clear paranasal sinuses. No acute finding in the orbits. Status post right lens replacement. Other: Trace fluid in right mastoid air cells. IMPRESSION: 1. No acute intracranial process. No evidence of acute or subacute infarct. 2. Innumerable foci of hemosiderin deposition in the cerebral hemispheres and cerebellar hemispheres, which are primarily peripheral, and concerning for cerebral amyloid angiopathy. Many of these were not seen on the prior exam, although that may be due to differences in technique. 3. Redemonstrated  advanced cerebral atrophy for age and ventriculomegaly, slightly more prominent on the left than right. No definite lobar predominant volume loss. 4. Superficial siderosis in the bilateral frontal and parietal lobes, which was also less apparent the prior MRI, likely secondary to technique. Electronically Signed  By: Wiliam Ke M.D.   On: 08/17/2022 20:25   CT ANGIO HEAD NECK W WO CM  Result Date: 08/17/2022 CLINICAL DATA:  TIA.  Trouble speaking EXAM: CT ANGIOGRAPHY HEAD AND NECK WITH AND WITHOUT CONTRAST TECHNIQUE: Multidetector CT imaging of the head and neck was performed using the standard protocol during bolus administration of intravenous contrast. Multiplanar CT image reconstructions and MIPs were obtained to evaluate the vascular anatomy. Carotid stenosis measurements (when applicable) are obtained utilizing NASCET criteria, using the distal internal carotid diameter as the denominator. RADIATION DOSE REDUCTION: This exam was performed according to the departmental dose-optimization program which includes automated exposure control, adjustment of the mA and/or kV according to patient size and/or use of iterative reconstruction technique. CONTRAST:  75mL OMNIPAQUE IOHEXOL 350 MG/ML SOLN COMPARISON:  Head CT from earlier today FINDINGS: Motion artifact causes significant blurring of structures in the upper neck. Normalization by the skull base. This artifact includes nondiagnostic evaluation of the major arteries. CTA NECK FINDINGS Aortic arch: Unremarkable Right carotid system: Limited atheromatous plaque at the bifurcation. No stenosis or ulceration seen. Left carotid system: Vessels are smoothly contoured and diffusely patent Vertebral arteries: No proximal subclavian stenosis. The vertebral arteries are smoothly contoured and widely patent where not distorted by artifact. Skeleton: Negative Other neck: Negative Upper chest: No acute finding Review of the MIP images confirms the above findings CTA  HEAD FINDINGS Anterior circulation: No significant stenosis, proximal occlusion, aneurysm, or vascular malformation. Posterior circulation: No significant stenosis, proximal occlusion, aneurysm, or vascular malformation. Venous sinuses: As permitted by contrast timing, patent. Anatomic variants: Negative Review of the MIP images confirms the above findings IMPRESSION: 1. Segment of nondiagnostic assessment in the upper neck due to motion artifact. 2. No emergent vascular finding. Mild atherosclerosis without significant stenosis or irregularity of major arteries in the head and neck. Electronically Signed   By: Tiburcio Pea M.D.   On: 08/17/2022 19:34   CT HEAD WO CONTRAST  Result Date: 08/17/2022 CLINICAL DATA:  TIA, neuro deficit EXAM: CT HEAD WITHOUT CONTRAST TECHNIQUE: Contiguous axial images were obtained from the base of the skull through the vertex without intravenous contrast. RADIATION DOSE REDUCTION: This exam was performed according to the departmental dose-optimization program which includes automated exposure control, adjustment of the mA and/or kV according to patient size and/or use of iterative reconstruction technique. COMPARISON:  05/13/2022 FINDINGS: Brain: No acute intracranial findings are seen. There are no signs of bleeding within the cranium. There is prominence of the third and both lateral ventricles with no significant interval change. Cortical sulci are prominent. There is decreased density in periventricular and subcortical white matter. Vascular: Scattered arterial calcifications are seen. Skull: No acute findings are seen. Sinuses/Orbits: Unremarkable. Other: No significant interval changes are noted. IMPRESSION: No acute intracranial findings are seen. Central and cortical atrophy. Small vessel disease. No significant interval changes are noted. Electronically Signed   By: Ernie Avena M.D.   On: 08/17/2022 16:03     TODAY-DAY OF DISCHARGE:  Subjective:   Eric Case today has no headache,no chest abdominal pain,no new weakness tingling or numbness, feels much better wants to go home today.  Objective:   Blood pressure (!) 141/90, pulse 71, temperature (!) 97.5 F (36.4 C), temperature source Oral, resp. rate 18, weight 89 kg, SpO2 90 %.  Intake/Output Summary (Last 24 hours) at 08/18/2022 1629 Last data filed at 08/18/2022 4098 Gross per 24 hour  Intake 480 ml  Output --  Net 480 ml  Filed Weights   08/17/22 1538  Weight: 89 kg    Exam: Awake Alert, Oriented *3, No new F.N deficits, Normal affect [place].AT,PERRAL Supple Neck,No JVD, No cervical lymphadenopathy appriciated.  Symmetrical Chest wall movement, Good air movement bilaterally, CTAB RRR,No Gallops,Rubs or new Murmurs, No Parasternal Heave +ve B.Sounds, Abd Soft, Non tender, No organomegaly appriciated, No rebound -guarding or rigidity. No Cyanosis, Clubbing or edema, No new Rash or bruise   PERTINENT RADIOLOGIC STUDIES: EEG adult  Result Date: 08-19-22 Charlsie Quest, MD     08/19/22  4:16 PM Patient Name: Eric Case MRN: [id number] Epilepsy Attending: Charlsie Quest Referring Physician/Provider: Marvel Plan, MD Date: 2022-08-19 Duration: 23.16 mins Patient history: 77 y.o. male past medical history of migraine headaches, hypothyroidism, prior TIAs, presenting for evaluation of acute speech abnormalities. Symptom onset was 1 PM today-had word finding difficulty that lasted about 45 minutes and then completely resolved.  EEG to evaluate for seizure Level of alertness: Awake AEDs during EEG study: None Technical aspects: This EEG study was done with scalp electrodes positioned according to the 10-20 International system of electrode placement. Electrical activity was reviewed with band pass filter of 1-70Hz , sensitivity of 7 uV/mm, display speed of 48mm/sec with a 60Hz  notched filter applied as appropriate. EEG data were recorded continuously and digitally stored.  Video  monitoring was available and reviewed as appropriate. Description: The posterior dominant rhythm consists of 8 Hz activity of moderate voltage (25-35 uV) seen predominantly in posterior head regions, symmetric and reactive to eye opening and eye closing.  Hyperventilation and photic stimulation were not performed.   IMPRESSION: This study is within normal limits. No seizures or epileptiform discharges were seen throughout the recording. A normal interictal EEG does not exclude the diagnosis of epilepsy. Charlsie Quest   ECHOCARDIOGRAM COMPLETE  Result Date: Aug 19, 2022    ECHOCARDIOGRAM REPORT   Patient Name:   Eric Case Date of Exam: Aug 19, 2022 Medical Rec #:  [id number]      Height:       68.0 in Accession #:    [id number]     Weight:       196.2 lb Date of Birth:  [birth year]      BSA:          2.027 m Patient Age:    76 years       BP:           125/82 mmHg Patient Gender: M              HR:           73 bpm. Exam Location:  Inpatient Procedure: 2D Echo, Cardiac Doppler and Color Doppler Indications:    TIA  History:        Patient has prior history of Echocardiogram examinations, most                 recent 03/11/2022. TIA, Arrythmias:Atrial Fibrillation;                 Signs/Symptoms:Syncope.  Sonographer:    Wallie Char Referring Phys: Sharla Kidney Werner Lean Avanelle Pixley IMPRESSIONS  1. Left ventricular ejection fraction, by estimation, is 50 to 55%. The left ventricle has low normal function. The left ventricle has no regional wall motion abnormalities. Left ventricular diastolic parameters are consistent with Grade I diastolic dysfunction (impaired relaxation).  2. Right ventricular systolic function is normal. The right ventricular size is normal. There is normal pulmonary artery systolic pressure.  3. The  mitral valve is grossly normal. No evidence of mitral valve regurgitation. No evidence of mitral stenosis.  4. The aortic valve was not well visualized. Aortic valve regurgitation is not visualized. No  aortic stenosis is present.  5. Aortic dilatation noted. There is mild dilatation of the ascending aorta, measuring 40 mm.  6. The inferior vena cava is normal in size with greater than 50% respiratory variability, suggesting right atrial pressure of 3 mmHg. Comparison(s): Similar to prior prior reporting. FINDINGS  Left Ventricle: Left ventricular ejection fraction, by estimation, is 50 to 55%. The left ventricle has low normal function. The left ventricle has no regional wall motion abnormalities. The left ventricular internal cavity size was normal in size. There is no left ventricular hypertrophy. Left ventricular diastolic parameters are consistent with Grade I diastolic dysfunction (impaired relaxation). Right Ventricle: The right ventricular size is normal. No increase in right ventricular wall thickness. Right ventricular systolic function is normal. There is normal pulmonary artery systolic pressure. The tricuspid regurgitant velocity is 2.08 m/s, and  with an assumed right atrial pressure of 3 mmHg, the estimated right ventricular systolic pressure is 20.3 mmHg. Left Atrium: Left atrial size was normal in size. Right Atrium: Right atrial size was normal in size. Pericardium: There is no evidence of pericardial effusion. Mitral Valve: The mitral valve is grossly normal. No evidence of mitral valve regurgitation. No evidence of mitral valve stenosis. MV peak gradient, 2.2 mmHg. The mean mitral valve gradient is 1.0 mmHg. Tricuspid Valve: The tricuspid valve is normal in structure. Tricuspid valve regurgitation is not demonstrated. No evidence of tricuspid stenosis. Aortic Valve: The aortic valve was not well visualized. Aortic valve regurgitation is not visualized. No aortic stenosis is present. Aortic valve mean gradient measures 4.0 mmHg. Aortic valve peak gradient measures 7.0 mmHg. Aortic valve area, by VTI measures 1.68 cm. Pulmonic Valve: The pulmonic valve was normal in structure. Pulmonic valve  regurgitation is mild. No evidence of pulmonic stenosis. Aorta: Aortic dilatation noted. There is mild dilatation of the ascending aorta, measuring 40 mm. Venous: The inferior vena cava is normal in size with greater than 50% respiratory variability, suggesting right atrial pressure of 3 mmHg. IAS/Shunts: No atrial level shunt detected by color flow Doppler.  LEFT VENTRICLE PLAX 2D LVIDd:         5.00 cm     Diastology LVIDs:         3.50 cm     LV e' medial:    4.72 cm/s LV PW:         1.10 cm     LV E/e' medial:  11.2 LV IVS:        1.00 cm     LV e' lateral:   10.90 cm/s LVOT diam:     1.70 cm     LV E/e' lateral: 4.9 LV SV:         39 LV SV Index:   19 LVOT Area:     2.27 cm  LV Volumes (MOD) LV vol d, MOD A2C: 98.7 ml LV vol d, MOD A4C: 97.8 ml LV vol s, MOD A2C: 50.2 ml LV vol s, MOD A4C: 45.0 ml LV SV MOD A2C:     48.5 ml LV SV MOD A4C:     97.8 ml LV SV MOD BP:      51.6 ml RIGHT VENTRICLE             IVC RV S prime:     12.40 cm/s  IVC diam: 1.50 cm TAPSE (M-mode): 2.1 cm LEFT ATRIUM             Index LA diam:        3.10 cm 1.53 cm/m LA Vol (A2C):   44.3 ml 21.85 ml/m LA Vol (A4C):   59.7 ml 29.45 ml/m LA Biplane Vol: 53.2 ml 26.24 ml/m  AORTIC VALVE AV Area (Vmax):    1.53 cm AV Area (Vmean):   1.54 cm AV Area (VTI):     1.68 cm AV Vmax:           132.00 cm/s AV Vmean:          89.800 cm/s AV VTI:            0.234 m AV Peak Grad:      7.0 mmHg AV Mean Grad:      4.0 mmHg LVOT Vmax:         89.10 cm/s LVOT Vmean:        60.800 cm/s LVOT VTI:          0.173 m LVOT/AV VTI ratio: 0.74  AORTA Ao Root diam: 3.60 cm Ao Asc diam:  4.00 cm MITRAL VALVE               TRICUSPID VALVE MV Area (PHT): 3.39 cm    TR Peak grad:   17.3 mmHg MV Area VTI:   2.26 cm    TR Vmax:        208.00 cm/s MV Peak grad:  2.2 mmHg MV Mean grad:  1.0 mmHg    SHUNTS MV Vmax:       0.74 m/s    Systemic VTI:  0.17 m MV Vmean:      45.7 cm/s   Systemic Diam: 1.70 cm MV Decel Time: 224 msec MV E velocity: 53.10 cm/s MV A velocity:  81.10 cm/s MV E/A ratio:  0.65 Riley Lam MD Electronically signed by Riley Lam MD Signature Date/Time: 08/18/2022/3:03:55 PM    Final    MR BRAIN WO CONTRAST  Result Date: 08/17/2022 CLINICAL DATA:  Aphasia, stroke suspected EXAM: MRI HEAD WITHOUT CONTRAST TECHNIQUE: Multiplanar, multiecho pulse sequences of the brain and surrounding structures were obtained without intravenous contrast. COMPARISON:  03/10/2022 FINDINGS: Brain: No restricted diffusion to suggest acute or subacute infarct. No acute hemorrhage, mass, mass effect, or midline shift. No hydrocephalus or extra-axial collection. Normal craniocervical junction. Innumerable foci of hemosiderin deposition in the cerebral hemispheres, which are primarily peripheral, and cerebellar hemispheres. Many of these were not seen on the prior exam, although that may be due to differences in technique. In addition, superficial siderosis is noted in the bilateral frontal and parietal lobes, which was also less apparent the prior MRI, likely secondary to technique. Redemonstrated advanced cerebral atrophy for age and ventriculomegaly, slightly more prominent on the left than right. No definite lobar predominant. Confluent T2 hyperintense signal in the periventricular white matter, likely the sequela of severe chronic small vessel ischemic disease. , Vascular: Normal arterial flow voids. Skull and upper cervical spine: Normal marrow signal. Sinuses/Orbits: Clear paranasal sinuses. No acute finding in the orbits. Status post right lens replacement. Other: Trace fluid in right mastoid air cells. IMPRESSION: 1. No acute intracranial process. No evidence of acute or subacute infarct. 2. Innumerable foci of hemosiderin deposition in the cerebral hemispheres and cerebellar hemispheres, which are primarily peripheral, and concerning for cerebral amyloid angiopathy. Many of these were not seen on the prior exam, although that may be due  to differences in  technique. 3. Redemonstrated advanced cerebral atrophy for age and ventriculomegaly, slightly more prominent on the left than right. No definite lobar predominant volume loss. 4. Superficial siderosis in the bilateral frontal and parietal lobes, which was also less apparent the prior MRI, likely secondary to technique. Electronically Signed   By: Wiliam Ke M.D.   On: 08/17/2022 20:25   CT ANGIO HEAD NECK W WO CM  Result Date: 08/17/2022 CLINICAL DATA:  TIA.  Trouble speaking EXAM: CT ANGIOGRAPHY HEAD AND NECK WITH AND WITHOUT CONTRAST TECHNIQUE: Multidetector CT imaging of the head and neck was performed using the standard protocol during bolus administration of intravenous contrast. Multiplanar CT image reconstructions and MIPs were obtained to evaluate the vascular anatomy. Carotid stenosis measurements (when applicable) are obtained utilizing NASCET criteria, using the distal internal carotid diameter as the denominator. RADIATION DOSE REDUCTION: This exam was performed according to the departmental dose-optimization program which includes automated exposure control, adjustment of the mA and/or kV according to patient size and/or use of iterative reconstruction technique. CONTRAST:  75mL OMNIPAQUE IOHEXOL 350 MG/ML SOLN COMPARISON:  Head CT from earlier today FINDINGS: Motion artifact causes significant blurring of structures in the upper neck. Normalization by the skull base. This artifact includes nondiagnostic evaluation of the major arteries. CTA NECK FINDINGS Aortic arch: Unremarkable Right carotid system: Limited atheromatous plaque at the bifurcation. No stenosis or ulceration seen. Left carotid system: Vessels are smoothly contoured and diffusely patent Vertebral arteries: No proximal subclavian stenosis. The vertebral arteries are smoothly contoured and widely patent where not distorted by artifact. Skeleton: Negative Other neck: Negative Upper chest: No acute finding Review of the MIP images  confirms the above findings CTA HEAD FINDINGS Anterior circulation: No significant stenosis, proximal occlusion, aneurysm, or vascular malformation. Posterior circulation: No significant stenosis, proximal occlusion, aneurysm, or vascular malformation. Venous sinuses: As permitted by contrast timing, patent. Anatomic variants: Negative Review of the MIP images confirms the above findings IMPRESSION: 1. Segment of nondiagnostic assessment in the upper neck due to motion artifact. 2. No emergent vascular finding. Mild atherosclerosis without significant stenosis or irregularity of major arteries in the head and neck. Electronically Signed   By: Tiburcio Pea M.D.   On: 08/17/2022 19:34   CT HEAD WO CONTRAST  Result Date: 08/17/2022 CLINICAL DATA:  TIA, neuro deficit EXAM: CT HEAD WITHOUT CONTRAST TECHNIQUE: Contiguous axial images were obtained from the base of the skull through the vertex without intravenous contrast. RADIATION DOSE REDUCTION: This exam was performed according to the departmental dose-optimization program which includes automated exposure control, adjustment of the mA and/or kV according to patient size and/or use of iterative reconstruction technique. COMPARISON:  05/13/2022 FINDINGS: Brain: No acute intracranial findings are seen. There are no signs of bleeding within the cranium. There is prominence of the third and both lateral ventricles with no significant interval change. Cortical sulci are prominent. There is decreased density in periventricular and subcortical white matter. Vascular: Scattered arterial calcifications are seen. Skull: No acute findings are seen. Sinuses/Orbits: Unremarkable. Other: No significant interval changes are noted. IMPRESSION: No acute intracranial findings are seen. Central and cortical atrophy. Small vessel disease. No significant interval changes are noted. Electronically Signed   By: Ernie Avena M.D.   On: 08/17/2022 16:03     PERTINENT LAB  RESULTS: CBC: Recent Labs    08/17/22 1558 08/17/22 1603 08/18/22 0428  WBC 9.0  --  9.3  HGB 15.4 16.0 15.3  HCT 47.4 47.0 46.4  PLT 274  --  268   CMET CMP     Component Value Date/Time   NA 138 08/18/2022 0428   K 3.5 08/18/2022 0428   CL 104 08/18/2022 0428   CO2 26 08/18/2022 0428   GLUCOSE 77 08/18/2022 0428   BUN 15 08/18/2022 0428   CREATININE 0.84 08/18/2022 0428   CALCIUM 8.9 08/18/2022 0428   PROT 7.3 08/17/2022 1558   ALBUMIN 4.1 08/17/2022 1558   AST 21 08/17/2022 1558   ALT 20 08/17/2022 1558   ALKPHOS 48 08/17/2022 1558   BILITOT 0.7 08/17/2022 1558   GFRNONAA >60 08/18/2022 0428   GFRAA >60 04/05/2016 0919    GFR Estimated Creatinine Clearance: 83.6 mL/min (by C-G formula based on SCr of 0.84 mg/dL). No results for input(s): "LIPASE", "AMYLASE" in the last 72 hours. No results for input(s): "CKTOTAL", "CKMB", "CKMBINDEX", "TROPONINI" in the last 72 hours. Invalid input(s): "POCBNP" No results for input(s): "DDIMER" in the last 72 hours. No results for input(s): "HGBA1C" in the last 72 hours. Recent Labs    08/18/22 0428  CHOL 111  HDL 46  LDLCALC 55  TRIG 48  CHOLHDL 2.4   No results for input(s): "TSH", "T4TOTAL", "T3FREE", "THYROIDAB" in the last 72 hours.  Invalid input(s): "FREET3" No results for input(s): "VITAMINB12", "FOLATE", "FERRITIN", "TIBC", "IRON", "RETICCTPCT" in the last 72 hours. Coags: Recent Labs    08/17/22 1558  INR 1.0   Microbiology: No results found for this or any previous visit (from the past 240 hour(s)).  FURTHER DISCHARGE INSTRUCTIONS:  Get Medicines reviewed and adjusted: Please take all your medications with you for your next visit with your Primary MD  Laboratory/radiological data: Please request your Primary MD to go over all hospital tests and procedure/radiological results at the follow up, please ask your Primary MD to get all Hospital records sent to his/her office.  In some cases, they will be  blood work, cultures and biopsy results pending at the time of your discharge. Please request that your primary care M.D. goes through all the records of your hospital data and follows up on these results.  Also Note the following: If you experience worsening of your admission symptoms, develop shortness of breath, life threatening emergency, suicidal or homicidal thoughts you must seek medical attention immediately by calling 911 or calling your MD immediately  if symptoms less severe.  You must read complete instructions/literature along with all the possible adverse reactions/side effects for all the Medicines you take and that have been prescribed to you. Take any new Medicines after you have completely understood and accpet all the possible adverse reactions/side effects.   Do not drive when taking Pain medications or sleeping medications (Benzodaizepines)  Do not take more than prescribed Pain, Sleep and Anxiety Medications. It is not advisable to combine anxiety,sleep and pain medications without talking with your primary care practitioner  Special Instructions: If you have smoked or chewed Tobacco  in the last 2 yrs please stop smoking, stop any regular Alcohol  and or any Recreational drug use.  Wear Seat belts while driving.  Please note: You were cared for by a hospitalist during your hospital stay. Once you are discharged, your primary care physician will handle any further medical issues. Please note that NO REFILLS for any discharge medications will be authorized once you are discharged, as it is imperative that you return to your primary care physician (or establish a relationship with a primary care physician if you do not have  one) for your post hospital discharge needs so that they can reassess your need for medications and monitor your lab values.  Total Time spent coordinating discharge including counseling, education and face to face time equals greater than 30  minutes.  SignedJeoffrey Massed 08/18/2022 4:29 PM

## 2022-08-18 NOTE — Procedures (Signed)
Patient Name: [name]  MRN: [id number]  Epilepsy Attending: Charlsie Quest  Referring Physician/Provider: Marvel Plan, MD  Date: 08/18/2022 Duration: 23.16 mins  Patient history: 77 y.o. male past medical history of migraine headaches, hypothyroidism, prior TIAs, presenting for evaluation of acute speech abnormalities. Symptom onset was 1 PM today-had word finding difficulty that lasted about 45 minutes and then completely resolved.  EEG to evaluate for seizure  Level of alertness: Awake  AEDs during EEG study: None  Technical aspects: This EEG study was done with scalp electrodes positioned according to the 10-20 International system of electrode placement. Electrical activity was reviewed with band pass filter of 1-70Hz , sensitivity of 7 uV/mm, display speed of 73mm/sec with a 60Hz  notched filter applied as appropriate. EEG data were recorded continuously and digitally stored.  Video monitoring was available and reviewed as appropriate.  Description: The posterior dominant rhythm consists of 8 Hz activity of moderate voltage (25-35 uV) seen predominantly in posterior head regions, symmetric and reactive to eye opening and eye closing.  Hyperventilation and photic stimulation were not performed.     IMPRESSION: This study is within normal limits. No seizures or epileptiform discharges were seen throughout the recording.  A normal interictal EEG does not exclude the diagnosis of epilepsy.   Shakti Fleer Annabelle Harman

## 2022-08-18 NOTE — Progress Notes (Signed)
SLP Cancellation Note  Patient Details Name: Eric Case MRN: [id number] DOB: Sep 07, 1945   Cancelled treatment:       Reason Eval/Treat Not Completed: SLP screened, no needs identified, will sign off. Chart review completed. Stroke workup negative. Consulted with pt and wife. Per SLP observation, pt's peech is mainly fluent with occasional paucity of speech. Per pt and wife, pt is at baseline for speech/language in setting of dementia.  [name], M.S., CCC-SLP Speech-Language Pathologist Secure Chat Preferred  O: [phone]  [name] 08/18/2022, 9:25 AM

## 2022-08-18 NOTE — TOC CM/SW Note (Signed)
Transition of Care [facility]) - Inpatient Brief Assessment   Patient Details  Name: Eric Case MRN: [id number] Date of Birth: 04/09/1945  [facility]) CM/SW Contact:    Mearl Latin, LCSW Phone Number: 08/18/2022, 4:32 PM   Clinical Narrative: Patient admitted from home with his wife. No current Transition of Care needs identified at this time. Please re-consult if needed.    Transition of Care Asessment: Insurance and Status: Insurance coverage has been reviewed Patient has primary care physician: Yes Home environment has been reviewed: From home Prior level of function:: Modified Independent Prior/Current Home Services: No current home services Social Determinants of Health Reivew: SDOH reviewed no interventions necessary Readmission risk has been reviewed: Yes Transition of care needs: no transition of care needs at this time

## 2022-08-18 NOTE — TOC Transition Note (Signed)
Transition of Care [facility]) - CM/SW Discharge Note   Patient Details  Name: ASHAUN KILLION MRN: [id number] Date of Birth: Nov 28, 1945  [facility]) CM/SW Contact:  Gordy Clement, RN Phone Number: 08/18/2022, 4:30 PM   Clinical Narrative:     Patient will DC to home No TOC Needs           Patient Goals and CMS Choice      Discharge Placement                         Discharge Plan and Services Additional resources added to the After Visit Summary for                                       Social Determinants of Health (SDOH) Interventions SDOH Screenings   Food Insecurity: Patient Declined (08/17/2022)  Housing: Low Risk  (08/17/2022)  Transportation Needs: No Transportation Needs (08/17/2022)  Utilities: Not At Risk (08/17/2022)  Tobacco Use: Low Risk  (08/17/2022)     Readmission Risk Interventions     No data to display

## 2022-08-18 NOTE — Progress Notes (Signed)
STROKE TEAM PROGRESS NOTE   SUBJECTIVE (INTERVAL HISTORY) His wife is at the bedside.  Wife stated that last year, he was lifting heavy stuff and he then had episode of word finding difficulty.  This time he again lifting heavy stuff, and then he had episode of word finding difficulty.  Now has resolved.  Patient has been following with Dr. Delena Bali at [facility] for TIA and cognitive impairment.  Previous MRI concerning for CAA.  He is on aspirin and Crestor at home.   OBJECTIVE Temp:  [97.5 F (36.4 C)-98.3 F (36.8 C)] 97.5 F (36.4 C) (05/29 1553) Pulse Rate:  [53-73] 71 (05/29 1553) Cardiac Rhythm: Normal sinus rhythm (05/29 0800) Resp:  [14-20] 18 (05/29 1553) BP: (119-159)/(71-99) 141/90 (05/29 1553) SpO2:  [90 %-100 %] 90 % (05/29 1149)  No results for input(s): "GLUCAP" in the last 168 hours. Recent Labs  Lab 08/17/22 1558 08/17/22 1603 08/18/22 0428  NA 136 138 138  K 3.9 4.1 3.5  CL 104 102 104  CO2 21*  --  26  GLUCOSE 146* 143* 77  BUN 23 24* 15  CREATININE 0.84 0.80 0.84  CALCIUM 9.0  --  8.9   Recent Labs  Lab 08/17/22 1558  AST 21  ALT 20  ALKPHOS 48  BILITOT 0.7  PROT 7.3  ALBUMIN 4.1   Recent Labs  Lab 08/17/22 1558 08/17/22 1603 08/18/22 0428  WBC 9.0  --  9.3  NEUTROABS 5.9  --   --   HGB 15.4 16.0 15.3  HCT 47.4 47.0 46.4  MCV 87.5  --  86.6  PLT 274  --  268   No results for input(s): "CKTOTAL", "CKMB", "CKMBINDEX", "TROPONINI" in the last 168 hours. Recent Labs    08/17/22 1558  LABPROT 13.3  INR 1.0   Recent Labs    08/17/22 1541 08/17/22 1735  COLORURINE URINE MISLABELED, ED NOTIFIED THAT WE DID NOT RECEIVED A URINE FOR THIS PATIENT YELLOW  LABSPEC URINE MISLABELED, ED NOTIFIED THAT WE DID NOT RECEIVED A URINE FOR THIS PATIENT 1.014  PHURINE 6.0 5.0  GLUCOSEU URINE MISLABELED, ED NOTIFIED THAT WE DID NOT RECEIVED A URINE FOR THIS PATIENT NEGATIVE  HGBUR URINE MISLABELED, ED NOTIFIED THAT WE DID NOT RECEIVED A URINE FOR THIS PATIENT  SMALL*  BILIRUBINUR URINE MISLABELED, ED NOTIFIED THAT WE DID NOT RECEIVED A URINE FOR THIS PATIENT NEGATIVE  KETONESUR URINE MISLABELED, ED NOTIFIED THAT WE DID NOT RECEIVED A URINE FOR THIS PATIENT NEGATIVE  PROTEINUR URINE MISLABELED, ED NOTIFIED THAT WE DID NOT RECEIVED A URINE FOR THIS PATIENT NEGATIVE  NITRITE URINE MISLABELED, ED NOTIFIED THAT WE DID NOT RECEIVED A URINE FOR THIS PATIENT NEGATIVE  LEUKOCYTESUR URINE MISLABELED, ED NOTIFIED THAT WE DID NOT RECEIVED A URINE FOR THIS PATIENT NEGATIVE       Component Value Date/Time   CHOL 111 08/18/2022 0428   TRIG 48 08/18/2022 0428   HDL 46 08/18/2022 0428   CHOLHDL 2.4 08/18/2022 0428   VLDL 10 08/18/2022 0428   LDLCALC 55 08/18/2022 0428   Lab Results  Component Value Date   HGBA1C 6.0 (H) 11/09/2021      Component Value Date/Time   LABOPIA NONE DETECTED 08/17/2022 1735   COCAINSCRNUR NONE DETECTED 08/17/2022 1735   LABBENZ NONE DETECTED 08/17/2022 1735   AMPHETMU NONE DETECTED 08/17/2022 1735   THCU NONE DETECTED 08/17/2022 1735   LABBARB NONE DETECTED 08/17/2022 1735    Recent Labs  Lab 08/17/22 1558  ETH <10  I have personally reviewed the radiological images below and agree with the radiology interpretations.  EEG adult  Result Date: 08/18/2022 Charlsie Quest, MD     08/18/2022  4:16 PM Patient Name: Eric Case MRN: [id number] Epilepsy Attending: Charlsie Quest Referring Physician/Provider: Marvel Plan, MD Date: 08/18/2022 Duration: 23.16 mins Patient history: 77 y.o. male past medical history of migraine headaches, hypothyroidism, prior TIAs, presenting for evaluation of acute speech abnormalities. Symptom onset was 1 PM today-had word finding difficulty that lasted about 45 minutes and then completely resolved.  EEG to evaluate for seizure Level of alertness: Awake AEDs during EEG study: None Technical aspects: This EEG study was done with scalp electrodes positioned according to the 10-20 International  system of electrode placement. Electrical activity was reviewed with band pass filter of 1-70Hz , sensitivity of 7 uV/mm, display speed of 76mm/sec with a 60Hz  notched filter applied as appropriate. EEG data were recorded continuously and digitally stored.  Video monitoring was available and reviewed as appropriate. Description: The posterior dominant rhythm consists of 8 Hz activity of moderate voltage (25-35 uV) seen predominantly in posterior head regions, symmetric and reactive to eye opening and eye closing.  Hyperventilation and photic stimulation were not performed.   IMPRESSION: This study is within normal limits. No seizures or epileptiform discharges were seen throughout the recording. A normal interictal EEG does not exclude the diagnosis of epilepsy. Charlsie Quest   ECHOCARDIOGRAM COMPLETE  Result Date: 08/18/2022    ECHOCARDIOGRAM REPORT   Patient Name:   Eric Case Date of Exam: 08/18/2022 Medical Rec #:  [id number]      Height:       68.0 in Accession #:    [id number]     Weight:       196.2 lb Date of Birth:  January 25, 1946      BSA:          2.027 m Patient Age:    77 years       BP:           125/82 mmHg Patient Gender: M              HR:           73 bpm. Exam Location:  Inpatient Procedure: 2D Echo, Cardiac Doppler and Color Doppler Indications:    TIA  History:        Patient has prior history of Echocardiogram examinations, most                 recent 03/11/2022. TIA, Arrythmias:Atrial Fibrillation;                 Signs/Symptoms:Syncope.  Sonographer:    Wallie Char Referring Phys: Sharla Kidney Werner Lean GHIMIRE IMPRESSIONS  1. Left ventricular ejection fraction, by estimation, is 50 to 55%. The left ventricle has low normal function. The left ventricle has no regional wall motion abnormalities. Left ventricular diastolic parameters are consistent with Grade I diastolic dysfunction (impaired relaxation).  2. Right ventricular systolic function is normal. The right ventricular size is normal.  There is normal pulmonary artery systolic pressure.  3. The mitral valve is grossly normal. No evidence of mitral valve regurgitation. No evidence of mitral stenosis.  4. The aortic valve was not well visualized. Aortic valve regurgitation is not visualized. No aortic stenosis is present.  5. Aortic dilatation noted. There is mild dilatation of the ascending aorta, measuring 40 mm.  6. The inferior vena cava is normal in size with  greater than 50% respiratory variability, suggesting right atrial pressure of 3 mmHg. Comparison(s): Similar to prior prior reporting. FINDINGS  Left Ventricle: Left ventricular ejection fraction, by estimation, is 50 to 55%. The left ventricle has low normal function. The left ventricle has no regional wall motion abnormalities. The left ventricular internal cavity size was normal in size. There is no left ventricular hypertrophy. Left ventricular diastolic parameters are consistent with Grade I diastolic dysfunction (impaired relaxation). Right Ventricle: The right ventricular size is normal. No increase in right ventricular wall thickness. Right ventricular systolic function is normal. There is normal pulmonary artery systolic pressure. The tricuspid regurgitant velocity is 2.08 m/s, and  with an assumed right atrial pressure of 3 mmHg, the estimated right ventricular systolic pressure is 20.3 mmHg. Left Atrium: Left atrial size was normal in size. Right Atrium: Right atrial size was normal in size. Pericardium: There is no evidence of pericardial effusion. Mitral Valve: The mitral valve is grossly normal. No evidence of mitral valve regurgitation. No evidence of mitral valve stenosis. MV peak gradient, 2.2 mmHg. The mean mitral valve gradient is 1.0 mmHg. Tricuspid Valve: The tricuspid valve is normal in structure. Tricuspid valve regurgitation is not demonstrated. No evidence of tricuspid stenosis. Aortic Valve: The aortic valve was not well visualized. Aortic valve regurgitation is  not visualized. No aortic stenosis is present. Aortic valve mean gradient measures 4.0 mmHg. Aortic valve peak gradient measures 7.0 mmHg. Aortic valve area, by VTI measures 1.68 cm. Pulmonic Valve: The pulmonic valve was normal in structure. Pulmonic valve regurgitation is mild. No evidence of pulmonic stenosis. Aorta: Aortic dilatation noted. There is mild dilatation of the ascending aorta, measuring 40 mm. Venous: The inferior vena cava is normal in size with greater than 50% respiratory variability, suggesting right atrial pressure of 3 mmHg. IAS/Shunts: No atrial level shunt detected by color flow Doppler.  LEFT VENTRICLE PLAX 2D LVIDd:         5.00 cm     Diastology LVIDs:         3.50 cm     LV e' medial:    4.72 cm/s LV PW:         1.10 cm     LV E/e' medial:  11.2 LV IVS:        1.00 cm     LV e' lateral:   10.90 cm/s LVOT diam:     1.70 cm     LV E/e' lateral: 4.9 LV SV:         39 LV SV Index:   19 LVOT Area:     2.27 cm  LV Volumes (MOD) LV vol d, MOD A2C: 98.7 ml LV vol d, MOD A4C: 97.8 ml LV vol s, MOD A2C: 50.2 ml LV vol s, MOD A4C: 45.0 ml LV SV MOD A2C:     48.5 ml LV SV MOD A4C:     97.8 ml LV SV MOD BP:      51.6 ml RIGHT VENTRICLE             IVC RV S prime:     12.40 cm/s  IVC diam: 1.50 cm TAPSE (M-mode): 2.1 cm LEFT ATRIUM             Index LA diam:        3.10 cm 1.53 cm/m LA Vol (A2C):   44.3 ml 21.85 ml/m LA Vol (A4C):   59.7 ml 29.45 ml/m LA Biplane Vol: 53.2 ml 26.24 ml/m  AORTIC VALVE  AV Area (Vmax):    1.53 cm AV Area (Vmean):   1.54 cm AV Area (VTI):     1.68 cm AV Vmax:           132.00 cm/s AV Vmean:          89.800 cm/s AV VTI:            0.234 m AV Peak Grad:      7.0 mmHg AV Mean Grad:      4.0 mmHg LVOT Vmax:         89.10 cm/s LVOT Vmean:        60.800 cm/s LVOT VTI:          0.173 m LVOT/AV VTI ratio: 0.74  AORTA Ao Root diam: 3.60 cm Ao Asc diam:  4.00 cm MITRAL VALVE               TRICUSPID VALVE MV Area (PHT): 3.39 cm    TR Peak grad:   17.3 mmHg MV Area VTI:    2.26 cm    TR Vmax:        208.00 cm/s MV Peak grad:  2.2 mmHg MV Mean grad:  1.0 mmHg    SHUNTS MV Vmax:       0.74 m/s    Systemic VTI:  0.17 m MV Vmean:      45.7 cm/s   Systemic Diam: 1.70 cm MV Decel Time: 224 msec MV E velocity: 53.10 cm/s MV A velocity: 81.10 cm/s MV E/A ratio:  0.65 Riley Lam MD Electronically signed by Riley Lam MD Signature Date/Time: 08/18/2022/3:03:55 PM    Final    MR BRAIN WO CONTRAST  Result Date: 08/17/2022 CLINICAL DATA:  Aphasia, stroke suspected EXAM: MRI HEAD WITHOUT CONTRAST TECHNIQUE: Multiplanar, multiecho pulse sequences of the brain and surrounding structures were obtained without intravenous contrast. COMPARISON:  03/10/2022 FINDINGS: Brain: No restricted diffusion to suggest acute or subacute infarct. No acute hemorrhage, mass, mass effect, or midline shift. No hydrocephalus or extra-axial collection. Normal craniocervical junction. Innumerable foci of hemosiderin deposition in the cerebral hemispheres, which are primarily peripheral, and cerebellar hemispheres. Many of these were not seen on the prior exam, although that may be due to differences in technique. In addition, superficial siderosis is noted in the bilateral frontal and parietal lobes, which was also less apparent the prior MRI, likely secondary to technique. Redemonstrated advanced cerebral atrophy for age and ventriculomegaly, slightly more prominent on the left than right. No definite lobar predominant. Confluent T2 hyperintense signal in the periventricular white matter, likely the sequela of severe chronic small vessel ischemic disease. , Vascular: Normal arterial flow voids. Skull and upper cervical spine: Normal marrow signal. Sinuses/Orbits: Clear paranasal sinuses. No acute finding in the orbits. Status post right lens replacement. Other: Trace fluid in right mastoid air cells. IMPRESSION: 1. No acute intracranial process. No evidence of acute or subacute infarct. 2.  Innumerable foci of hemosiderin deposition in the cerebral hemispheres and cerebellar hemispheres, which are primarily peripheral, and concerning for cerebral amyloid angiopathy. Many of these were not seen on the prior exam, although that may be due to differences in technique. 3. Redemonstrated advanced cerebral atrophy for age and ventriculomegaly, slightly more prominent on the left than right. No definite lobar predominant volume loss. 4. Superficial siderosis in the bilateral frontal and parietal lobes, which was also less apparent the prior MRI, likely secondary to technique. Electronically Signed   By: Wiliam Ke M.D.   On: 08/17/2022 20:25  CT ANGIO HEAD NECK W WO CM  Result Date: 08/17/2022 CLINICAL DATA:  TIA.  Trouble speaking EXAM: CT ANGIOGRAPHY HEAD AND NECK WITH AND WITHOUT CONTRAST TECHNIQUE: Multidetector CT imaging of the head and neck was performed using the standard protocol during bolus administration of intravenous contrast. Multiplanar CT image reconstructions and MIPs were obtained to evaluate the vascular anatomy. Carotid stenosis measurements (when applicable) are obtained utilizing NASCET criteria, using the distal internal carotid diameter as the denominator. RADIATION DOSE REDUCTION: This exam was performed according to the departmental dose-optimization program which includes automated exposure control, adjustment of the mA and/or kV according to patient size and/or use of iterative reconstruction technique. CONTRAST:  75mL OMNIPAQUE IOHEXOL 350 MG/ML SOLN COMPARISON:  Head CT from earlier today FINDINGS: Motion artifact causes significant blurring of structures in the upper neck. Normalization by the skull base. This artifact includes nondiagnostic evaluation of the major arteries. CTA NECK FINDINGS Aortic arch: Unremarkable Right carotid system: Limited atheromatous plaque at the bifurcation. No stenosis or ulceration seen. Left carotid system: Vessels are smoothly contoured  and diffusely patent Vertebral arteries: No proximal subclavian stenosis. The vertebral arteries are smoothly contoured and widely patent where not distorted by artifact. Skeleton: Negative Other neck: Negative Upper chest: No acute finding Review of the MIP images confirms the above findings CTA HEAD FINDINGS Anterior circulation: No significant stenosis, proximal occlusion, aneurysm, or vascular malformation. Posterior circulation: No significant stenosis, proximal occlusion, aneurysm, or vascular malformation. Venous sinuses: As permitted by contrast timing, patent. Anatomic variants: Negative Review of the MIP images confirms the above findings IMPRESSION: 1. Segment of nondiagnostic assessment in the upper neck due to motion artifact. 2. No emergent vascular finding. Mild atherosclerosis without significant stenosis or irregularity of major arteries in the head and neck. Electronically Signed   By: Tiburcio Pea M.D.   On: 08/17/2022 19:34   CT HEAD WO CONTRAST  Result Date: 08/17/2022 CLINICAL DATA:  TIA, neuro deficit EXAM: CT HEAD WITHOUT CONTRAST TECHNIQUE: Contiguous axial images were obtained from the base of the skull through the vertex without intravenous contrast. RADIATION DOSE REDUCTION: This exam was performed according to the departmental dose-optimization program which includes automated exposure control, adjustment of the mA and/or kV according to patient size and/or use of iterative reconstruction technique. COMPARISON:  05/13/2022 FINDINGS: Brain: No acute intracranial findings are seen. There are no signs of bleeding within the cranium. There is prominence of the third and both lateral ventricles with no significant interval change. Cortical sulci are prominent. There is decreased density in periventricular and subcortical white matter. Vascular: Scattered arterial calcifications are seen. Skull: No acute findings are seen. Sinuses/Orbits: Unremarkable. Other: No significant interval  changes are noted. IMPRESSION: No acute intracranial findings are seen. Central and cortical atrophy. Small vessel disease. No significant interval changes are noted. Electronically Signed   By: Ernie Avena M.D.   On: 08/17/2022 16:03     PHYSICAL EXAM  Temp:  [97.5 F (36.4 C)-98.3 F (36.8 C)] 97.5 F (36.4 C) (05/29 1553) Pulse Rate:  [53-73] 71 (05/29 1553) Resp:  [14-20] 18 (05/29 1553) BP: (119-159)/(71-99) 141/90 (05/29 1553) SpO2:  [90 %-100 %] 90 % (05/29 1149)  General - Well nourished, well developed, in no apparent distress.  Ophthalmologic - fundi not visualized due to noncooperation.  Cardiovascular - Regular rhythm and rate.  Neuro - awake, alert, eyes open, orientated to place and people, but told me age 52 instead of 64, not orientated to year or month. No  aphasia, intermittent speech hesitancy with word finding difficulty and anomia, following all simple commands. Able to name frequently used objects but not infrequently used words, able to repeat.  However, decreased attention and concentration, not able to backward spell world.  Memory impairment.  No gaze palsy, tracking bilaterally, visual field full, PERRL. No facial droop. Tongue midline. Bilateral UEs 5/5, no drift. Bilaterally LEs 5/5, no drift. Sensation symmetrical bilaterally, b/l FTN intact, gait not tested.     ASSESSMENT/PLAN Mr. Eric Case is a 77 y.o. male with history of amyloid angiopathy," TIA" admitted for episode of speech difficulty, word finding difficulty. No tPA given due to symptom resolved.    Likely TFNE (amyloid spells) vs. Progressive cognitive impairment  Including this admission, patient had 2 episode of speech difficulty, concerning for amyloid spells.  However patient does have cognitive impairment with anomia. CT no acute abnormality CT head and neck unremarkable MRI no acute infarct, progressive cerebral amyloid angiopathy EEG no seizure 2D Echo EF 50 to 55% LDL  55 HgbA1c pending Lovenox for VTE prophylaxis aspirin 81 mg daily prior to admission, now on No antithrombotic given progressive CAA Ongoing aggressive stroke risk factor management Therapy recommendations: None Disposition: Home today  Cerebral amyloid angiopathy Cognitive impairment 11/2021 MRI brain concerning for cerebral microhemorrhages Patient does have cognitive impairment, seems slowly progressing This admission MRI showed Innumerable foci of hemosiderin deposition in the cerebral hemispheres and cerebellar hemispheres, which are primarily peripheral, and concerning for cerebral amyloid angiopathy. Recommend BP goal less than 140, ideally less than 130 Avoid antiplatelet or anticoagulation Avoid statin at this time Discussed with wife  Hypertension Stable Long term BP goal less than 140, ideally less than 130 Home BP monitoring Close PCP follow-up for strict BP control  Hyperlipidemia Home meds: Crestor 5 LDL 55, goal < 70 Recommend no statin at this time given progressive CA  Other Stroke Risk Factors Advanced age  Other Active Problems Dementia, following with Dr. Dola Argyle day # 0  Neurology will sign off. Please call with questions. Pt will follow up with Dr. Delena Bali at [facility] in about 4 weeks. Thanks for the consult.   Marvel Plan, MD PhD Stroke Neurology 08/18/2022 4:37 PM    To contact Stroke Continuity provider, please refer to [email or web address]. After hours, contact General Neurology

## 2022-08-19 LAB — HEMOGLOBIN A1C
Hgb A1c MFr Bld: 5.8 % — ABNORMAL HIGH (ref 4.8–5.6)
Mean Plasma Glucose: 120 mg/dL

## 2022-08-25 DIAGNOSIS — I680 Cerebral amyloid angiopathy: Secondary | ICD-10-CM | POA: Diagnosis not present

## 2022-08-25 DIAGNOSIS — I10 Essential (primary) hypertension: Secondary | ICD-10-CM | POA: Diagnosis not present

## 2022-08-26 ENCOUNTER — Ambulatory Visit: Payer: Medicare Other | Admitting: Neurology

## 2022-08-26 ENCOUNTER — Encounter: Payer: Self-pay | Admitting: Neurology

## 2022-08-26 VITALS — BP 146/77 | HR 72 | Ht 68.0 in | Wt 196.4 lb

## 2022-08-26 DIAGNOSIS — R4701 Aphasia: Secondary | ICD-10-CM | POA: Diagnosis not present

## 2022-08-26 DIAGNOSIS — F03C0 Unspecified dementia, severe, without behavioral disturbance, psychotic disturbance, mood disturbance, and anxiety: Secondary | ICD-10-CM

## 2022-08-26 DIAGNOSIS — Z8673 Personal history of transient ischemic attack (TIA), and cerebral infarction without residual deficits: Secondary | ICD-10-CM

## 2022-08-26 DIAGNOSIS — I680 Cerebral amyloid angiopathy: Secondary | ICD-10-CM | POA: Diagnosis not present

## 2022-08-26 DIAGNOSIS — R413 Other amnesia: Secondary | ICD-10-CM

## 2022-08-26 DIAGNOSIS — R410 Disorientation, unspecified: Secondary | ICD-10-CM

## 2022-08-26 NOTE — Progress Notes (Signed)
CC:  memory loss  Follow-up Visit  Last visit: 03/16/22   Brief HPI: 77 year old male with a history of GERD, hypothyroidism who follows in clinic for memory loss which began after he developed COVID and fell hitting his head in November 2022. Brain MRI 11/27/21 showed moderate-severe atrophy and moderate chronic small vessel ischemic disease. It also showed 5 microhemorrhages. Had another MRI 02/2022 which showed an 11 mm focus of hemosiderin which was new from MRI in September, concerning for cerebral amyloid angiopathy.  At his last visit referral for neuropsychological evaluation was placed.  Interval History 08/26/2022:: Here with his wife who provides most information. Patient was seen in the hospital. has ESOPHAGEAL STRICTURE; GERD; DIVERTICULITIS OF COLON; PERSONAL HISTORY OF COLONIC POLYPS; S/P knee replacement; Primary osteoarthritis of one knee, right; Syncope, vasovagal; Syncope; and Expressive aphasia on their problem list. We see him for memory loss, I have never seen this patient his providers are out of the office. I reviewed chart.  This is a patient of my colleague Dr. Delena Bali and Ihor Austin however they are both not in the office.  He is seen here for memory loss, he was recently seen at the hospital at the end of May for a TIA versus an amyloid spell.  He had aphasia and not resolved back to baseline.  MRI of the brain was negative, CT of the head neck was negative for stenosis, TTE was stable, EEG showed no seizures.  It was felt to be an amyloid spell.  Recommendations were to hold aspirin and statin and follow-up with outpatient neurology.  He presented with expressive aphasia.He was aware during the events. No other focal neurologic deficits. We reviewed the mri of his brain images with wife and patient which showed multiple microhemorrhages likely CAA. Explained CAA and relation to alzheimer's. I agree amyloid spell vs seizure. Think he needs longer eeg, discussed withour  epilepsy expert Dr. Teresa Coombs. MoCA 13/30.   IMPRESSION: MRI brain : reviewed images with patient and wife 1. No acute intracranial process. No evidence of acute or subacute infarct. 2. Innumerable foci of hemosiderin deposition in the cerebral hemispheres and cerebellar hemispheres, which are primarily peripheral, and concerning for cerebral amyloid angiopathy. Many of these were not seen on the prior exam, although that may be due to differences in technique. 3. Redemonstrated advanced cerebral atrophy for age and ventriculomegaly, slightly more prominent on the left than right. No definite lobar predominant volume loss. 4. Superficial siderosis in the bilateral frontal and parietal lobes, which was also less apparent the prior MRI, likely secondary to technique.  Reviewed labs  Recent Results (from the past 2160 hour(s))  Urinalysis, Routine w reflex microscopic -Urine, Clean Catch     Status: None   Collection Time: 08/17/22  3:41 PM  Result Value Ref Range   Color, Urine  YELLOW    URINE MISLABELED, ED NOTIFIED THAT WE DID NOT RECEIVED A URINE FOR THIS PATIENT    Comment: CORRECTED ON 05/28 AT 1746: PREVIOUSLY REPORTED AS YELLOW   APPearance  CLEAR    URINE MISLABELED, ED NOTIFIED THAT WE DID NOT RECEIVED A URINE FOR THIS PATIENT    Comment: CORRECTED ON 05/28 AT 1746: PREVIOUSLY REPORTED AS CLEAR   Specific Gravity, Urine  1.005 - 1.030    URINE MISLABELED, ED NOTIFIED THAT WE DID NOT RECEIVED A URINE FOR THIS PATIENT    Comment: CORRECTED ON 05/28 AT 1746: PREVIOUSLY REPORTED AS 1.010   pH 6.0 5.0 -  8.0   Glucose, UA  NEGATIVE mg/dL    URINE MISLABELED, ED NOTIFIED THAT WE DID NOT RECEIVED A URINE FOR THIS PATIENT    Comment: CORRECTED ON 05/28 AT 1746: PREVIOUSLY REPORTED AS NEGATIVE   Hgb urine dipstick  NEGATIVE    URINE MISLABELED, ED NOTIFIED THAT WE DID NOT RECEIVED A URINE FOR THIS PATIENT    Comment: CORRECTED ON 05/28 AT 1746: PREVIOUSLY REPORTED AS NEGATIVE    Bilirubin Urine  NEGATIVE    URINE MISLABELED, ED NOTIFIED THAT WE DID NOT RECEIVED A URINE FOR THIS PATIENT    Comment: CORRECTED ON 05/28 AT 1746: PREVIOUSLY REPORTED AS NEGATIVE   Ketones, ur  NEGATIVE mg/dL    URINE MISLABELED, ED NOTIFIED THAT WE DID NOT RECEIVED A URINE FOR THIS PATIENT    Comment: CORRECTED ON 05/28 AT 1746: PREVIOUSLY REPORTED AS NEGATIVE   Protein, ur  NEGATIVE mg/dL    URINE MISLABELED, ED NOTIFIED THAT WE DID NOT RECEIVED A URINE FOR THIS PATIENT    Comment: CORRECTED ON 05/28 AT 1746: PREVIOUSLY REPORTED AS NEGATIVE   Nitrite  NEGATIVE    URINE MISLABELED, ED NOTIFIED THAT WE DID NOT RECEIVED A URINE FOR THIS PATIENT    Comment: CORRECTED ON 05/28 AT 1746: PREVIOUSLY REPORTED AS NEGATIVE   Leukocytes,Ua  NEGATIVE    URINE MISLABELED, ED NOTIFIED THAT WE DID NOT RECEIVED A URINE FOR THIS PATIENT    Comment: Performed at [facility], [address] [place], [place] [address] CORRECTED ON 05/28 AT 1746: PREVIOUSLY REPORTED AS NEGATIVE   Ethanol     Status: None   Collection Time: 08/17/22  3:58 PM  Result Value Ref Range   Alcohol, Ethyl (B) <10 <10 mg/dL    Comment: (NOTE) Lowest detectable limit for serum alcohol is 10 mg/dL.  For medical purposes only. Performed at [facility], [address], [place], [place] [address]   Protime-INR     Status: None   Collection Time: 08/17/22  3:58 PM  Result Value Ref Range   Prothrombin Time 13.3 11.4 - 15.2 seconds   INR 1.0 0.8 - 1.2    Comment: (NOTE) INR goal varies based on device and disease states. Performed at [facility], [address] [place], [place] [address]   APTT     Status: None   Collection Time: 08/17/22  3:58 PM  Result Value Ref Range   aPTT 32 24 - 36 seconds    Comment: Performed at [facility], [address], [place], [place] [address]  CBC     Status: None   Collection Time: 08/17/22  3:58 PM  Result  Value Ref Range   WBC 9.0 4.0 - 10.5 K/uL   RBC 5.42 4.22 - 5.81 MIL/uL   Hemoglobin 15.4 13.0 - 17.0 g/dL   HCT 44.0 10.2 - 72.5 %   MCV 87.5 80.0 - 100.0 fL   MCH 28.4 26.0 - 34.0 pg   MCHC 32.5 30.0 - 36.0 g/dL   RDW 36.6 44.0 - 34.7 %   Platelets 274 150 - 400 K/uL   nRBC 0.0 0.0 - 0.2 %    Comment: Performed at [facility], [address] [place], [place] [address]  Differential     Status: None   Collection Time: 08/17/22  3:58 PM  Result Value Ref Range   Neutrophils Relative % 67 %   Neutro Abs 5.9 1.7 - 7.7 K/uL  Lymphocytes Relative 22 %   Lymphs Abs 2.0 0.7 - 4.0 K/uL   Monocytes Relative 7 %   Monocytes Absolute 0.7 0.1 - 1.0 K/uL   Eosinophils Relative 3 %   Eosinophils Absolute 0.3 0.0 - 0.5 K/uL   Basophils Relative 1 %   Basophils Absolute 0.1 0.0 - 0.1 K/uL   Immature Granulocytes 0 %   Abs Immature Granulocytes 0.02 0.00 - 0.07 K/uL    Comment: Performed at [facility], [address], [place], [place] [address]  Comprehensive metabolic panel     Status: Abnormal   Collection Time: 08/17/22  3:58 PM  Result Value Ref Range   Sodium 136 135 - 145 mmol/L   Potassium 3.9 3.5 - 5.1 mmol/L   Chloride 104 98 - 111 mmol/L   CO2 21 (L) 22 - 32 mmol/L   Glucose, Bld 146 (H) 70 - 99 mg/dL    Comment: Glucose reference range applies only to samples taken after fasting for at least 8 hours.   BUN 23 8 - 23 mg/dL   Creatinine, Ser 6.04 0.61 - 1.24 mg/dL   Calcium 9.0 8.9 - 54.0 mg/dL   Total Protein 7.3 6.5 - 8.1 g/dL   Albumin 4.1 3.5 - 5.0 g/dL   AST 21 15 - 41 U/L   ALT 20 0 - 44 U/L   Alkaline Phosphatase 48 38 - 126 U/L   Total Bilirubin 0.7 0.3 - 1.2 mg/dL   GFR, Estimated >98 >11 mL/min    Comment: (NOTE) Calculated using the CKD-EPI Creatinine Equation (2021)    Anion gap 11 5 - 15    Comment: Performed at [facility], [address], [place], [place] [address]  Dickie La 8, ED     Status:  Abnormal   Collection Time: 08/17/22  4:03 PM  Result Value Ref Range   Sodium 138 135 - 145 mmol/L   Potassium 4.1 3.5 - 5.1 mmol/L   Chloride 102 98 - 111 mmol/L   BUN 24 (H) 8 - 23 mg/dL   Creatinine, Ser 2.95 0.61 - 1.24 mg/dL   Glucose, Bld 621 (H) 70 - 99 mg/dL    Comment: Glucose reference range applies only to samples taken after fasting for at least 8 hours.   Calcium, Ion 1.19 1.15 - 1.40 mmol/L   TCO2 24 22 - 32 mmol/L   Hemoglobin 16.0 13.0 - 17.0 g/dL   HCT 30.8 65.7 - 84.6 %  Rapid urine drug screen (hospital performed)     Status: None   Collection Time: 08/17/22  5:35 PM  Result Value Ref Range   Opiates NONE DETECTED NONE DETECTED   Cocaine NONE DETECTED NONE DETECTED   Benzodiazepines NONE DETECTED NONE DETECTED   Amphetamines NONE DETECTED NONE DETECTED   Tetrahydrocannabinol NONE DETECTED NONE DETECTED   Barbiturates NONE DETECTED NONE DETECTED    Comment: (NOTE) DRUG SCREEN FOR MEDICAL PURPOSES ONLY.  IF CONFIRMATION IS NEEDED FOR ANY PURPOSE, NOTIFY LAB WITHIN 5 DAYS.  LOWEST DETECTABLE LIMITS FOR URINE DRUG SCREEN Drug Class                     Cutoff (ng/mL) Amphetamine and metabolites    1000 Barbiturate and metabolites    200 Benzodiazepine                 200 Opiates and metabolites        300 Cocaine and metabolites  300 THC                            50 Performed at [facility], [address] [place], [place] [address]   Urinalysis, Routine w reflex microscopic -     Status: Abnormal   Collection Time: 08/17/22  5:35 PM  Result Value Ref Range   Color, Urine YELLOW YELLOW   APPearance CLEAR CLEAR   Specific Gravity, Urine 1.014 1.005 - 1.030   pH 5.0 5.0 - 8.0   Glucose, UA NEGATIVE NEGATIVE mg/dL   Hgb urine dipstick SMALL (A) NEGATIVE   Bilirubin Urine NEGATIVE NEGATIVE   Ketones, ur NEGATIVE NEGATIVE mg/dL   Protein, ur NEGATIVE NEGATIVE mg/dL   Nitrite NEGATIVE NEGATIVE   Leukocytes,Ua NEGATIVE NEGATIVE    RBC / HPF 0-5 0 - 5 RBC/hpf   WBC, UA 0-5 0 - 5 WBC/hpf   Bacteria, UA NONE SEEN NONE SEEN   Squamous Epithelial / HPF 0-5 0 - 5 /HPF   Mucus PRESENT     Comment: Performed at [facility], [address] [place], [place] [address]  Lipid panel     Status: None   Collection Time: 08/18/22  4:28 AM  Result Value Ref Range   Cholesterol 111 0 - 200 mg/dL   Triglycerides 48 <191 mg/dL   HDL 46 >47 mg/dL   Total CHOL/HDL Ratio 2.4 RATIO   VLDL 10 0 - 40 mg/dL   LDL Cholesterol 55 0 - 99 mg/dL    Comment:        Total Cholesterol/HDL:CHD Risk Coronary Heart Disease Risk Table                     Men   Women  1/2 Average Risk   3.4   3.3  Average Risk       5.0   4.4  2 X Average Risk   9.6   7.1  3 X Average Risk  23.4   11.0        Use the calculated Patient Ratio above and the CHD Risk Table to determine the patient's CHD Risk.        ATP III CLASSIFICATION (LDL):  <100     mg/dL   Optimal  829-562  mg/dL   Near or Above                    Optimal  130-159  mg/dL   Borderline  130-865  mg/dL   High  >784     mg/dL   Very High Performed at [facility], [address], [place], [place] [address]   Hemoglobin A1c     Status: Abnormal   Collection Time: 08/18/22  4:28 AM  Result Value Ref Range   Hgb A1c MFr Bld 5.8 (H) 4.8 - 5.6 %    Comment: (NOTE)         Prediabetes: 5.7 - 6.4         Diabetes: >6.4         Glycemic control for adults with diabetes: <7.0    Mean Plasma Glucose 120 mg/dL    Comment: (NOTE) Performed At: [facility] [address] [place], [place] [id number] Jolene Schimke MD [id number]   CBC     Status: None   Collection Time: 08/18/22  4:28 AM  Result Value Ref Range   WBC 9.3 4.0 -  10.5 K/uL   RBC 5.36 4.22 - 5.81 MIL/uL   Hemoglobin 15.3 13.0 - 17.0 g/dL   HCT 16.1 09.6 - 04.5 %   MCV 86.6 80.0 - 100.0 fL   MCH 28.5 26.0 - 34.0 pg   MCHC 33.0 30.0 - 36.0 g/dL   RDW 40.9 81.1 - 91.4 %   Platelets  268 150 - 400 K/uL   nRBC 0.0 0.0 - 0.2 %    Comment: Performed at [facility], [address] [place], [place] [address]  Basic metabolic panel     Status: None   Collection Time: 08/18/22  4:28 AM  Result Value Ref Range   Sodium 138 135 - 145 mmol/L   Potassium 3.5 3.5 - 5.1 mmol/L   Chloride 104 98 - 111 mmol/L   CO2 26 22 - 32 mmol/L   Glucose, Bld 77 70 - 99 mg/dL    Comment: Glucose reference range applies only to samples taken after fasting for at least 8 hours.   BUN 15 8 - 23 mg/dL   Creatinine, Ser 6.21 0.61 - 1.24 mg/dL   Calcium 8.9 8.9 - 30.8 mg/dL   GFR, Estimated >65 >78 mL/min    Comment: (NOTE) Calculated using the CKD-EPI Creatinine Equation (2021)    Anion gap 8 5 - 15    Comment: Performed at [facility], [address] [place], [place] [address]  ECHOCARDIOGRAM COMPLETE     Status: None   Collection Time: 08/18/22  2:36 PM  Result Value Ref Range   Weight 3,139.35 oz   BP 125/82 mmHg   Single Plane A2C EF 49.1 %   Single Plane A4C EF 54.0 %   Calc EF 52.1 %   S' Lateral 3.50 cm   AR max vel 1.53 cm2   AV Area VTI 1.68 cm2   AV Mean grad 4.0 mmHg   AV Peak grad 7.0 mmHg   Ao pk vel 1.32 m/s   Area-P 1/2 3.39 cm2   AV Area mean vel 1.54 cm2   MV VTI 2.26 cm2   Est EF 50 - 55%    Patient complains of symptoms per HPI as well as the following symptoms: aphasia . Pertinent negatives and positives per HPI. All others negative  Vitals:   08/26/22 1504  BP: (!) 146/77  Pulse: 72      Physical Exam:  Exam: NAD, pleasant                  Speech:    Speech is normal; fluent and spontaneous with normal comprehension.  Cognition:    06/16/2022    3:55 PM 03/16/2022    8:12 AM 11/09/2021    1:26 PM  Montreal Cognitive Assessment   Visuospatial/ Executive (0/5) 0 0 2  Naming (0/3) 1 2 1   Attention: Read list of digits (0/2) 2 1 1   Attention: Read list of letters (0/1) 1 1 1   Attention: Serial 7 subtraction starting at 100 (0/3) 1  1 2   Language: Repeat phrase (0/2) 2 2 1   Language : Fluency (0/1) 1 1 0  Abstraction (0/2) 1 2 2   Delayed Recall (0/5) 0 0 0  Orientation (0/6) 4 4 4   Total 13 14 14   Adjusted Score (based on education) 13 14    ;    Cranial Nerves:    The pupils are equal, round, and reactive to light.Trigeminal sensation is intact and the muscles of mastication are normal.  The face is symmetric. The palate elevates in the midline. Hearing intact. Voice is normal. Shoulder shrug is normal. The tongue has normal motion without fasciculations.   Coordination:  No dysmetria  Motor Observation:    No asymmetry, no atrophy, and no involuntary movements noted. Tone:    Normal muscle tone.     Strength:    Strength is V/V in the upper and lower limbs.      Sensation: intact to LT   IMPRESSION: 77 year old male with a history of GERD, hypothyroidism who presents for follow up of memory loss. Most recent MRI was suggestive of cerebral amyloid angiopathy. Discussed increased incidence of dementia and transient neurologic spells associated with CAA.   Here with his wife who provides most information. Patient was seen in the hospital. has ESOPHAGEAL STRICTURE; GERD; DIVERTICULITIS OF COLON; PERSONAL HISTORY OF COLONIC POLYPS; S/P knee replacement; Primary osteoarthritis of one knee, right; Syncope, vasovagal; Syncope; and Expressive aphasia on their problem list. We see him for memory loss, I have never seen this patient his providers are out of the office. I reviewed chart.  This is a patient of my colleague Dr. Delena Bali and Ihor Austin however they are both not in the office.  He is seen here for memory loss, he was recently seen at the hospital at the end of May for a TIA versus an amyloid spell.  He had aphasia and not resolved back to baseline.  MRI of the brain was negative, CT of the head neck was negative for stenosis, TTE was stable, EEG showed no seizures.  It was felt to be an amyloid spell.   Recommendations were to hold aspirin and statin and follow-up with outpatient neurology.  He presented with expressive aphasia.He was aware during the events. No other focal neurologic deficits. We reviewed the mri of his brain images with wife and patient which showed multiple microhemorrhages likely CAA. Explained CAA and relation to alzheimer's. I agree amyloid spell vs seizure. Think he needs longer eeg, discussed withour epilepsy expert Dr. Teresa Coombs. MoCA 13/30.   Discussed dementia, seizures, amyloid, CAA, association with alzheimers, discussed other workup we could provide and medications such as aricept., formal neurocognitive testing, bloodwork. He is not a candidate for lequembi, discussed.    CAA:   Stop statin and asa due to microhemorrhages.   Discussed managing blood pressure and other risk factors for bleeds and that he is at high risk for bleeding In the brain Provided literature on seizures and CAA and CAA itself  Amyloid spell vs seizure or both:  23 minute eeg negative. 3-day eeg ordered. Seizures are highly associated with CAA.   Follow-up: 8 months  I spent over 40 minutes of face-to-face and non-face-to-face time with patient on the  1. Severe dementia, unspecified dementia type, unspecified whether behavioral, psychotic, or mood disturbance or anxiety (HCC)   2. Cerebral amyloid angiopathy (CODE)   3. Transient confusion   4. Aphasia   5. History of stroke    diagnosis.  This included previsit chart review, lab review, study review, order entry, electronic health record documentation, patient education on the different diagnostic and therapeutic options, counseling and coordination of care, risks and benefits of management, compliance, or risk factor reduction

## 2022-08-26 NOTE — Patient Instructions (Addendum)
3-day eeg at home  Per [facility] statutes, patients with seizures are not allowed to drive until they have been seizure-free for six months.    Use caution when using heavy equipment or power tools. Avoid working on ladders or at heights. Take showers instead of baths. Ensure the water temperature is not too high on the home water heater. Do not go swimming alone. Do not lock yourself in a room alone (i.e. bathroom). When caring for infants or small children, sit down when holding, feeding, or changing them to minimize risk of injury to the child in the event you have a seizure. Maintain good sleep hygiene. Avoid alcohol.    If patient has another seizure, call 911 and bring them back to the ED if: A.  The seizure lasts longer than 5 minutes.      B.  The patient doesn't wake shortly after the seizure or has new problems such as difficulty seeing, speaking or moving following the seizure C.  The patient was injured during the seizure D.  The patient has a temperature over 102 F (39C) E.  The patient vomited during the seizure and now is having trouble breathing  Per [facility] statutes, patients with seizures are not allowed to drive until they have been seizure-free for six months.  Other recommendations include using caution when using heavy equipment or power tools. Avoid working on ladders or at heights. Take showers instead of baths.  Do not swim alone.  Ensure the water temperature is not too high on the home water heater. Do not go swimming alone. Do not lock yourself in a room alone (i.e. bathroom). When caring for infants or small children, sit down when holding, feeding, or changing them to minimize risk of injury to the child in the event you have a seizure. Maintain good sleep hygiene. Avoid alcohol.  Also recommend adequate sleep, hydration, good diet and minimize stress.  During the Seizure  - First, ensure adequate ventilation and place patients on the floor on their  left side  Loosen clothing around the neck and ensure the airway is patent. If the patient is clenching the teeth, do not force the mouth open with any object as this can cause severe damage - Remove all items from the surrounding that can be hazardous. The patient may be oblivious to what's happening and may not even know what he or she is doing. If the patient is confused and wandering, either gently guide him/her away and block access to outside areas - Reassure the individual and be comforting - Call 911. In most cases, the seizure ends before EMS arrives. However, there are cases when seizures may last over 3 to 5 minutes. Or the individual may have developed breathing difficulties or severe injuries. If a pregnant patient or a person with diabetes develops a seizure, it is prudent to call an ambulance. - Finally, if the patient does not regain full consciousness, then call EMS. Most patients will remain confused for about 45 to 90 minutes after a seizure, so you must use judgment in calling for help. - Avoid restraints but make sure the patient is in a bed with padded side rails - Place the individual in a lateral position with the neck slightly flexed; this will help the saliva drain from the mouth and prevent the tongue from falling backward - Remove all nearby furniture and other hazards from the area - Provide verbal assurance as the individual is regaining consciousness -  Provide the patient with privacy if possible - Call for help and start treatment as ordered by the caregiver   fter the Seizure (Postictal Stage)  After a seizure, most patients experience confusion, fatigue, muscle pain and/or a headache. Thus, one should permit the individual to sleep. For the next few days, reassurance is essential. Being calm and helping reorient the person is also of importance.  Most seizures are painless and end spontaneously. Seizures are not harmful to others but can lead to complications such  as stress on the lungs, brain and the heart. Individuals with prior lung problems may develop labored breathing and respiratory distress.    Epilepsy Epilepsy is a condition in which a person has repeated seizures over time. A seizure is a sudden burst of abnormal electrical and chemical activity in the brain. Seizures can cause a change in attention, behavior, or ability to remain awake and alert (altered mental status). Epilepsy increases a person's risk of falls, accidents, and injury. It can also lead to: Depression. Poor memory. Sudden unexplained death in epilepsy (SUDEP). This is rare, and its cause is not known. Most people with epilepsy lead normal lives. What are the causes? This condition may be caused by: A head injury or injury that happens at birth. A high fever during childhood. A stroke. Bleeding into or around the brain. Certain medicines and drugs. Having too little oxygen for a long period of time. Abnormal brain development. Certain conditions. These may include: Brain infection. Brain tumor. Conditions that are passed from parent to child (are hereditary). Many times, the cause of this condition is not known. What are the signs or symptoms? Symptoms of a seizure vary greatly from person to person. They may include: Uncontrollable shaking (convulsions) with fast, jerking movements of the arms or legs. Stiffening of the body. Breathing problems. Confusion, staring, or unresponsiveness. Head nodding, eye blinking or fluttering, or rapid eye movements. Drooling, grunting, or making clicking sounds with your mouth. Loss of bladder control and bowel control. Some people have symptoms right before a seizure happens (aura) and right after a seizure happens. Symptoms of an aura include: Fear or anxiety. Nausea. Vertigo. This is a feeling like: You are moving when you are not. Your surroundings are moving when they are not. Dj vu. This is a feeling of having seen or  heard something before. Odd tastes or smells. Changes in vision, such as seeing flashing lights or spots. Symptoms that follow a seizure include: Confusion. Sleepiness. Headache. Sore muscles. How is this diagnosed? This condition is diagnosed based on: Your symptoms. Your medical history. A physical exam. A neurological exam. This includes checking your strength, reflexes, coordination, and sensations. Tests. These may include: A painless test that records your brain waves (electroencephalogram, orEEG). MRI. CT scan. A test of your spinal fluid (lumbar puncture, or spinal tap). Blood tests to check for signs of infection or abnormal blood chemistry. How is this treated? Treatment can control seizures. Some types of epilepsy will need lifelong treatment, and some types go away in time. This condition may be treated with: Medicines to control seizures and prevent future seizures. A vagus nerve stimulator. This is a device that is implanted in the chest. The device sends electrical impulses to the vagus nerve and to the brain to prevent seizures. This treatment may be recommended if medicines do not help. Brain surgery. There are several kinds of surgeries that may be done to stop seizures from happening or to reduce how often seizures happen.  Blood tests. You may need to have blood tests regularly to check that you are getting the right amount of medicine. The ketogenic diet. This diet involves foods that are low in carbohydrates and high in fat. When this condition has been diagnosed, it is important to begin treatment as soon as possible. For some people, epilepsy goes away in time. Follow these instructions at home: Medicines Take over-the-counter and prescription medicines only as told by your health care provider. Avoid any substances that may prevent your medicine from working properly, such as alcohol. Activity Get enough rest. Lack of sleep can make seizures more likely to  happen. Follow instructions from your health care provider about driving, swimming, and doing any other activities that would be dangerous if you had a seizure. If you live in the [place] check with your local department of motor vehicles [facility]) to find out about local driving laws. Each state has specific rules about when you can legally start driving again. Educating others  Teach friends and family what to do if you have a seizure. They should: Help you get down to the ground to prevent a fall. Cushion your head and body. Loosen any tight clothing around your neck. Turn you on your side. If vomiting occurs, this helps keep your airway clear. Not hold you down. Holding you down will not stop the seizure. Not put anything in your mouth. Stay with you until you recover. Know whether or not you need emergency care. General instructions Avoid anything that has ever triggered a seizure for you. Keep a seizure diary. Record what you remember about each seizure, especially anything that might have triggered the seizure. Keep all follow-up visits. This is important. Where to find more information Epilepsy Foundation: epilepsy.com International League Against Epilepsy: ilae.org Contact a health care provider if: Your seizure pattern changes. You continue to have seizures with treatment. You have symptoms of an infection or illness. Either of these might increase your risk of having a seizure. You are unable to take your medicine. Get help right away if: You have: A seizure that does not stop after 5 minutes. Several seizures in a row without a complete recovery between seizures. A seizure that makes it harder to breathe. A seizure that leaves you unable to speak or use a part of your body. You did not wake up right away after a seizure. You injure yourself during a seizure. You have confusion or pain right after a seizure. These symptoms may represent a serious problem that is an emergency.  Do not wait to see if the symptoms will go away. Get medical help right away. Call your local emergency services (911 in the [place] Do not drive yourself to the hospital. If you ever feel like you may hurt yourself or others, or have thoughts about taking your own life, get help right away. Go to your nearest emergency department or: Call your local emergency services (911 in the [place] Call a suicide crisis helpline, such as the National Suicide Prevention Lifeline at 813-241-9236 or 988 in the [place] This is open 24 hours a day in the [place] Text the Crisis Text Line at 340-398-5506 (in the [place] Summary Epilepsy is a condition in which a person has repeated seizures over time. Some types of epilepsy will need lifelong treatment, and some types go away in time. Seizures can cause many symptoms, such as brief staring and uncontrollable shaking or fast movements of the arms or legs. Treatment can control seizures. Take over-the-counter  and prescription medicines only as told by your health care provider. Follow instructions from your health care provider about driving, swimming, and doing any other activities that would be dangerous if you had a seizure. Teach friends and family what to do if you have a seizure. This information is not intended to replace advice given to you by your health care provider. Make sure you discuss any questions you have with your health care provider. Document Revised: 10/01/2020 Document Reviewed: 09/10/2019 Elsevier Patient Education  2023 [employer].

## 2022-08-29 ENCOUNTER — Encounter: Payer: Self-pay | Admitting: Neurology

## 2022-08-30 ENCOUNTER — Telehealth: Payer: Self-pay | Admitting: *Deleted

## 2022-08-30 NOTE — Telephone Encounter (Signed)
Ambulatory EEG (72 hour) order form has been signed by Dr Lucia Gaskins and faxed to [facility]. Received a receipt of confirmation.

## 2022-08-30 NOTE — Telephone Encounter (Signed)
-----   Message from Anson Fret, MD sent at 08/29/2022  3:31 PM EDT ----- Regarding: 3-day ambulatory eeg Please order 3-day eeg for patient due to episode of confusion. Routine EEG hospital negative

## 2022-08-30 NOTE — Telephone Encounter (Signed)
Order form completed for 72 hour ambulatory video EEG. Order form is awaiting Dr Trevor Mace signature then it needs to be faxed to [employer] (fax # [phone]).

## 2022-09-07 NOTE — Telephone Encounter (Signed)
Insurance approved coverage of video ambulatory EEG from August 31, 2022 through September 30, 2022. Authorization number #: [id number]

## 2022-09-22 DIAGNOSIS — R569 Unspecified convulsions: Secondary | ICD-10-CM | POA: Diagnosis not present

## 2022-09-22 DIAGNOSIS — R410 Disorientation, unspecified: Secondary | ICD-10-CM | POA: Diagnosis not present

## 2022-09-23 DIAGNOSIS — R569 Unspecified convulsions: Secondary | ICD-10-CM | POA: Diagnosis not present

## 2022-09-23 DIAGNOSIS — R410 Disorientation, unspecified: Secondary | ICD-10-CM | POA: Diagnosis not present

## 2022-09-24 DIAGNOSIS — R569 Unspecified convulsions: Secondary | ICD-10-CM | POA: Diagnosis not present

## 2022-09-24 DIAGNOSIS — R410 Disorientation, unspecified: Secondary | ICD-10-CM | POA: Diagnosis not present

## 2022-09-25 DIAGNOSIS — R410 Disorientation, unspecified: Secondary | ICD-10-CM | POA: Diagnosis not present

## 2022-09-25 DIAGNOSIS — R569 Unspecified convulsions: Secondary | ICD-10-CM | POA: Diagnosis not present

## 2022-10-01 ENCOUNTER — Other Ambulatory Visit: Payer: Self-pay | Admitting: Neurology

## 2022-10-01 DIAGNOSIS — R410 Disorientation, unspecified: Secondary | ICD-10-CM

## 2022-10-01 NOTE — Procedures (Signed)
Clinical History : This is a 77 y/o M who presents with migraine headaches. He is also presenting with acute word finding issues. Routine EEG was read WNL.  INTERMITTENT MONITORING with VIDEO TECHNICAL SUMMARY: This AVEEG was performed using equipment provided by [employer] utilizing Bluetooth ( [employer] ) amplifiers with continuous EEGT attended video collection using encrypted remote transmission via [employer] secured cellular tower network with data rates for each AVEEG performed. This is a Therapist, music AVEEG, obtained, according to the 10-20 international electrode placement system, reformatted digitally into referential and bipolar montages. Data was acquired with a minimum of 21 bipolar connections and sampled at a minimum rate of 250 cycles per second per channel, maximum rate of 450 cycles per second per channel and two channels for EKG. The entire VEEG study was recorded through cable and or radio telemetry for subsequent analysis. Specified epochs of the AVEEG data were identified at the direction of the subject by the depression of a push button by the patient. Each patients event file included data acquired two minutes prior to the push button activation and continuing until two minutes afterwards. AVEEG files were reviewed on [facility] server, Licensed Software provided by [employer] with a digital high frequency filter set at 70 Hz and a low frequency filter set at 1 Hz with a paper speed of 65mm/s resulting in 10 seconds per digital page. This entire AVEEG was reviewed by the EEG Technologist. Random time samples, random sleep samples, clips, patient initiated push button files with included patient daily diary logs, EEG Technologist pruned data was reviewed and verified for accuracy and validity by the governing reading neurologist in full details. This AEEGV was fully compliant with all requirements for CPT 97500 for setup, patient education, take down and  administered by an EEG technologist.  Long-Term EEG with Video was monitored intermittently by a qualified EEG technologist for the entirety of the recording; quality check-ins were performed at a minimum of every two hours, checking and documenting real-time data and video to assure the integrity and quality of the recording (e.g., camera position, electrode integrity and impedance), and identify the need for maintenance. For intermittent monitoring, an EEG Technologist monitored no more than 12 patients concurrently. Diagnostic video was captured at least 80% of the time during the recording.  PATIENT EVENTS: There were no patient events noted or captured during this recording.  TECHNOLOGIST EVENTS: No clear epileptiform activity was detected by the reviewing neurodiagnostic technologist during the recording for further evaluation.  TIME SAMPLES: 10-minutes of every 2 hours recorded are reviewed as random time samples.  SLEEP SAMPLES: 5-minutes of every 24 hours recorded are reviewed as random sleep samples.  AWAKE: At maximal level of alertness, the posterior dominant background activity was continuous, reactive, low voltage rhythm of 8-9.5 Hz at best. This was symmetric, well-modulated, and attenuated with eye opening. Diffuse, symmetric, frontocentral beta range activity was present.  SLEEP: N1 Sleep (Stage 1) was observed and characterized by the disappearance of alpha rhythm and the appearance of vertex activity N2 Sleep (Stage 2) was observed and characterized by vertex waves, K-complexes, and sleep spindles. N3 (Stage 3) sleep was observed and characterized by high amplitude Delta activity of 20%. REM sleep was observed.  EKG: There were no arrhythmias or abnormalities noted during this recording.  Impression: This is a normal 72 hours ambulatory video EEG. There were no seizures, events or epileptiform discharges seen during this recording.   Windell Norfolk, MD [facility]

## 2022-10-04 ENCOUNTER — Telehealth: Payer: Self-pay | Admitting: Neurology

## 2022-10-04 ENCOUNTER — Encounter: Payer: Self-pay | Admitting: Neurology

## 2022-10-04 NOTE — Telephone Encounter (Signed)
Dr. Lucia Gaskins patient, EEG sent to me, result was normal. I sent the patient a my chart message. Supposed to see Dr. Delena Bali in November. Thanks  Impression: This is a normal 72 hours ambulatory video EEG. There were no seizures, events or epileptiform discharges seen during this recording.

## 2022-10-04 NOTE — Telephone Encounter (Signed)
He can follow up with Shanda Bumps, she was seeing him as well in the past, I think Freddi Che can take it from here.

## 2022-10-04 NOTE — Telephone Encounter (Signed)
EEG result sent via Mychart and seen by patient.

## 2022-10-05 NOTE — Telephone Encounter (Signed)
This pt has a upcoming appt with Dr Delena Bali 02/07/23, this will need to be r/s with Anderson Malta as Dr Delena Bali will not be in office.  Thanks

## 2022-10-05 NOTE — Telephone Encounter (Signed)
 Noted, thank you

## 2022-10-19 DIAGNOSIS — K080 Exfoliation of teeth due to systemic causes: Secondary | ICD-10-CM | POA: Diagnosis not present

## 2022-11-03 DIAGNOSIS — R3915 Urgency of urination: Secondary | ICD-10-CM | POA: Diagnosis not present

## 2022-11-03 DIAGNOSIS — K080 Exfoliation of teeth due to systemic causes: Secondary | ICD-10-CM | POA: Diagnosis not present

## 2022-11-03 DIAGNOSIS — N401 Enlarged prostate with lower urinary tract symptoms: Secondary | ICD-10-CM | POA: Diagnosis not present

## 2022-11-03 DIAGNOSIS — N5201 Erectile dysfunction due to arterial insufficiency: Secondary | ICD-10-CM | POA: Diagnosis not present

## 2022-11-04 DIAGNOSIS — C44722 Squamous cell carcinoma of skin of right lower limb, including hip: Secondary | ICD-10-CM | POA: Diagnosis not present

## 2022-11-18 DIAGNOSIS — H524 Presbyopia: Secondary | ICD-10-CM | POA: Diagnosis not present

## 2023-02-03 DIAGNOSIS — N401 Enlarged prostate with lower urinary tract symptoms: Secondary | ICD-10-CM | POA: Diagnosis not present

## 2023-02-03 DIAGNOSIS — I10 Essential (primary) hypertension: Secondary | ICD-10-CM | POA: Diagnosis not present

## 2023-02-03 DIAGNOSIS — R7303 Prediabetes: Secondary | ICD-10-CM | POA: Diagnosis not present

## 2023-02-07 ENCOUNTER — Ambulatory Visit: Payer: Medicare Other | Admitting: Psychiatry

## 2023-02-09 ENCOUNTER — Ambulatory Visit: Payer: Medicare Other | Admitting: Adult Health

## 2023-02-09 ENCOUNTER — Encounter: Payer: Self-pay | Admitting: Adult Health

## 2023-02-09 VITALS — BP 135/84 | HR 75 | Ht 68.0 in | Wt 202.0 lb

## 2023-02-09 DIAGNOSIS — E854 Organ-limited amyloidosis: Secondary | ICD-10-CM | POA: Diagnosis not present

## 2023-02-09 DIAGNOSIS — R479 Unspecified speech disturbances: Secondary | ICD-10-CM | POA: Diagnosis not present

## 2023-02-09 DIAGNOSIS — I680 Cerebral amyloid angiopathy: Secondary | ICD-10-CM | POA: Diagnosis not present

## 2023-02-09 NOTE — Progress Notes (Signed)
[facility]  PATIENT: Eric Case DOB: [birth year]  REFERRING CLINICIAN: Alysia Penna, MD HISTORY FROM: self, wife [name] REASON FOR VISIT: memory loss   HISTORICAL  CHIEF COMPLAINT:  Chief Complaint  Patient presents with   Follow-up    Patient in room #2 with donna his wife.. Patient wife states he had an episode last week where he couldn't find the words to say what's needed.    FOLLOW UP VISIT  Prior visit: 08/26/2022 with Dr. Lucia Gaskins  Brief HPI:  Eric Case is a 77 y.o. male who is being followed by Dr. Delena Bali for memory loss since 01/2021 after COVID likely in setting of dementia.  MRI brain 11/2021 showed moderate to severe atrophy, chronic small vessel disease and cerebral microhemorrhages felt to be associated with chronic small vessel ischemic disease.  He also noted imbalance with exam showing decreased pinprick and vibration to bilateral feet likely contributing, A1c 6.0, B12 WNL. Had another MRI 02/2022 which showed an 11 mm focus of hemosiderin which was new from MRI in September, concerning for cerebral amyloid angiopathy.   At prior visit, discussed results hospital visit in May for TIA vs amyloid spell after presenting with expressive aphasia.  Recommended discontinuing aspirin and statin due to multiple microhemorrhages recommended completion of 72-hour EEG as seizure is highly associated with CAA.     Interval history:  Accompanied today again by his wife. Memory has been stable since prior visit.  Reports recurrent episode last week with aphasia similar to prior episode in 07/2022 and 02/2022. Lasted 30 minutes and returned back to baseline. No loss of consciousness or altered mental status, no other associated symptoms. 72-hour EEG normal without evidence of seizures.  Continues to maintain ADLs independently.  He continues to drive without difficulty, only short distance and wife accompanies him.      REVIEW OF SYSTEMS: Full 14  system review of systems performed and negative with exception of: memory loss, imbalance  ALLERGIES: Allergies  Allergen Reactions   Ciprofloxacin Other (See Comments)    chest pain   Diclofenac Sodium Other (See Comments)    "made me feel bad all over", Flu like symptoms/fever   Doxycycline Other (See Comments)   Hydromorphone Itching   Lithium Benzoate [Lithium]     Dizziness and Passed Out   Nsaids     Other reaction(s): Unknown   Oxycodone-Acetaminophen Itching    REACTION: itching   Pennsaid [Diclofenac Sodium] Other (See Comments)    "made me feel bad all over" Flu like symptoms/fever    Percocet [Oxycodone-Acetaminophen]     unknown   Sulfamethoxazole-Trimethoprim Rash    septra    HOME MEDICATIONS: Outpatient Medications Prior to Visit  Medication Sig Dispense Refill   co-enzyme Q-10 30 MG capsule Take 30 mg by mouth daily.      lansoprazole (PREVACID) 15 MG capsule Take 15 mg by mouth daily.     levothyroxine (SYNTHROID, LEVOTHROID) 112 MCG tablet Take 112 mcg by mouth daily.       Multiple Vitamin (MULTIVITAMIN WITH MINERALS) TABS tablet Take 1 tablet by mouth daily.     Nutritional Supplements (SALMON OIL) CAPS Take 1 capsule by mouth daily.     Saw Palmetto, Serenoa repens, (SAW PALMETTO PO) Take 250 mg by mouth 2 (two) times daily.      No facility-administered medications prior to visit.    PAST MEDICAL HISTORY: Past Medical History:  Diagnosis Date   Anemia    on Iron- comes  and goes per patient    Arthritis    Cataract    starting but very small- RIGHT REMOVED   Complication of anesthesia    severe headache after esophagus stretched years ago   Diverticulitis    Diverticulosis    GERD (gastroesophageal reflux disease)    Hemorrhoids    Hiatal hernia    Hx of adenomatous colonic polyps    Hypothyroidism    Migraine, unspecified, not intractable, without status migrainosus    Peptic stricture of esophagus     PAST SURGICAL HISTORY: Past  Surgical History:  Procedure Laterality Date   ACHILLES TENDON SURGERY Right 03/19/2021   Procedure: Right Achilles tendon reconstruction with autograft FHL transfer to the calcaneus; gastroc recession;  Surgeon: [name], MD;  Location: [facility];  Service: Orthopedics;  Laterality: Right;    CATARACT EXTRACTION Right    COLONOSCOPY     KNEE SURGERY Right    tendon tear    MENISCUS REPAIR Right    2015 and May 2017   POLYPECTOMY     SHOULDER SURGERY     bilateral   TOTAL KNEE ARTHROPLASTY Right 04/02/2016   Procedure: RIGHT TOTAL KNEE ARTHROPLASTY;  Surgeon: Eugenia Mcalpine, MD;  Location: [facility];  Service: Orthopedics;  Laterality: Right;  Adductor Block   UPPER GASTROINTESTINAL ENDOSCOPY      FAMILY HISTORY: Family History  Problem Relation Age of Onset   Breast cancer Mother    Heart disease Mother    Liver cancer Father    Prostate cancer Father    Prostate cancer Maternal Uncle    Prostate cancer Maternal Uncle    Prostate cancer Maternal Uncle    Colon cancer Cousin    Colon polyps Neg Hx    Esophageal cancer Neg Hx    Rectal cancer Neg Hx    Stomach cancer Neg Hx     SOCIAL HISTORY: Social History   Socioeconomic History   Marital status: Married    Spouse name: [name]   Number of children: 1   Years of education: Not on file   Highest education level: Associate degree: occupational, Scientist, product/process development, or vocational program  Occupational History   Occupation: carpenter-retired  Tobacco Use   Smoking status: Never   Smokeless tobacco: Never  Substance and Sexual Activity   Alcohol use: No    Alcohol/week: 0.0 standard drinks of alcohol   Drug use: No   Sexual activity: Not on file  Other Topics Concern   Not on file  Social History Narrative   11/09/21 Live with wife   Daily caffeine   Social Determinants of Health   Financial Resource Strain: Not on file  Food Insecurity: Patient Declined (08/17/2022)   Hunger Vital Sign     Worried About Running Out of Food in the Last Year: Patient declined    Ran Out of Food in the Last Year: Patient declined  Transportation Needs: No Transportation Needs (08/17/2022)   PRAPARE - Administrator, Civil Service (Medical): No    Lack of Transportation (Non-Medical): No  Physical Activity: Not on file  Stress: Not on file  Social Connections: Not on file  Intimate Partner Violence: Not At Risk (08/17/2022)   Humiliation, Afraid, Rape, and Kick questionnaire    Fear of Current or Ex-Partner: No    Emotionally Abused: No    Physically Abused: No    Sexually Abused: No     PHYSICAL EXAM  GENERAL EXAM/CONSTITUTIONAL: Vitals:  Vitals:  02/09/23 1352  BP: 135/84  Pulse: 75  Weight: 202 lb (91.6 kg)  Height: 5\' 8"  (1.727 m)    NEUROLOGIC: MENTAL STATUS: Fluent speech and language.  Follows commands without difficulty.  No evidence of dysarthria.    02/09/2023    2:15 PM 06/16/2022    3:55 PM 03/16/2022    8:12 AM 11/09/2021    1:26 PM  Montreal Cognitive Assessment   Visuospatial/ Executive (0/5) 0 0 0 2  Naming (0/3) 1 1 2 1   Attention: Read list of digits (0/2) 2 2 1 1   Attention: Read list of letters (0/1) 0 1 1 1   Attention: Serial 7 subtraction starting at 100 (0/3) 0 1 1 2   Language: Repeat phrase (0/2) 1 2 2 1   Language : Fluency (0/1) 0 1 1 0  Abstraction (0/2) 2 1 2 2   Delayed Recall (0/5) 1 0 0 0  Orientation (0/6) 5 4 4 4   Total 12 13 14 14   Adjusted Score (based on education)  13 14      CRANIAL NERVE:  2nd, 3rd, 4th, 6th - pupils equal and reactive to light, visual fields full to confrontation, extraocular muscles intact, no nystagmus 5th - facial sensation symmetric 7th - facial strength symmetric 8th - hearing intact 9th - palate elevates symmetrically, uvula midline 11th - shoulder shrug symmetric 12th - tongue protrusion midline  MOTOR:  normal bulk and tone, no cogwheeling, full strength in the BUE, BLE, no evidence of  action or resting tremor  COORDINATION:  finger-nose-finger, fine finger movements normal, no tremor  REFLEXES:  deep tendon reflexes present and symmetric  GAIT/STATION:  Wide based gait, stooped posture, mild unsteadiness with turns, no use of AD     DIAGNOSTIC DATA (LABS, IMAGING, TESTING) - I reviewed patient records, labs, notes, testing and imaging myself where available.  MRI brain w/wo contrast 11/27/2021 IMPRESSION: MRI brain with and without contrast demonstrating: - Moderate to severe atrophy with ventriculomegaly on ex vacuo basis.   - Moderate chronic small vessel ischemic disease.  Approximately 5 chronic cerebral microhemorrhages noted, can also be associated with chronic small vessel ischemic disease. - No acute findings.  08/17/2022 IMPRESSION: 1. No acute intracranial process. No evidence of acute or subacute infarct. 2. Innumerable foci of hemosiderin deposition in the cerebral hemispheres and cerebellar hemispheres, which are primarily peripheral, and concerning for cerebral amyloid angiopathy. Many of these were not seen on the prior exam, although that may be due to differences in technique. 3. Redemonstrated advanced cerebral atrophy for age and ventriculomegaly, slightly more prominent on the left than right. No definite lobar predominant volume loss. 4. Superficial siderosis in the bilateral frontal and parietal lobes, which was also less apparent the prior MRI, likely secondary to technique.   Lab Results  Component Value Date   WBC 9.3 08/18/2022   HGB 15.3 08/18/2022   HCT 46.4 08/18/2022   MCV 86.6 08/18/2022   PLT 268 08/18/2022      Component Value Date/Time   NA 138 08/18/2022 0428   K 3.5 08/18/2022 0428   CL 104 08/18/2022 0428   CO2 26 08/18/2022 0428   GLUCOSE 77 08/18/2022 0428   BUN 15 08/18/2022 0428   CREATININE 0.84 08/18/2022 0428   CALCIUM 8.9 08/18/2022 0428   PROT 7.3 08/17/2022 1558   ALBUMIN 4.1 08/17/2022 1558    AST 21 08/17/2022 1558   ALT 20 08/17/2022 1558   ALKPHOS 48 08/17/2022 1558   BILITOT 0.7 08/17/2022 1558  GFRNONAA >60 08/18/2022 0428   GFRAA >60 04/05/2016 0919       ASSESSMENT AND PLAN  77 y.o. year old male with a history of GERD and hypothyroidism who presents for evaluation of worsening memory loss and imbalance following COVID infection and a fall in November 2022.  Suspect memory loss likely in setting of CAA. He has had transient episodes of aphasia in 02/2022, 07/2022 and 01/2023 suspected to be in setting of amyloid spell, less likely TIA. 72 hr EEG negative for seizures.      1. Cerebral amyloid angiopathy (HCC)   2. Transient speech disturbance       PLAN:  -Discussed recent transient aphasia which was similar to prior episodes -did discuss trialing ASM to see if this helps stop episodes but as episodes infrequent, declines interest at this time.  Patient/wife was advised to call or send MyChart message with any recurrent episodes -Advised to call 911 immediately with any recurrent episodes that last longer than normal or associated with other neurological deficits -Currently not interested in other medication such as donepezil or memantine at this time     Return in about 6 months (around 08/09/2023).    I spent 30 minutes of face-to-face and non-face-to-face time with patient and wife.  This included previsit chart review, lab review, study review, order entry, electronic health record documentation, patient and wife education and discussion regarding above diagnoses and treatment plan and answered all the questions to patient's satisfaction  Ihor Austin, [facility]  [facility] [address] [place], [place] [address]  Phone [phone] Fax [phone] Note: This document was prepared with digital dictation and possible smart phrase technology. Any transcriptional errors that result from this process are  unintentional.

## 2023-02-09 NOTE — Patient Instructions (Addendum)
Please call with any recurrent episodes of speech difficulties   Please call 911 with any recurrent episodes which last longer than normal or associated with any other symptoms     Follow up in 6 months or call earlier if needed

## 2023-02-11 DIAGNOSIS — Z1331 Encounter for screening for depression: Secondary | ICD-10-CM | POA: Diagnosis not present

## 2023-02-11 DIAGNOSIS — H6122 Impacted cerumen, left ear: Secondary | ICD-10-CM | POA: Diagnosis not present

## 2023-02-11 DIAGNOSIS — I680 Cerebral amyloid angiopathy: Secondary | ICD-10-CM | POA: Diagnosis not present

## 2023-02-11 DIAGNOSIS — R7303 Prediabetes: Secondary | ICD-10-CM | POA: Diagnosis not present

## 2023-02-11 DIAGNOSIS — Z1339 Encounter for screening examination for other mental health and behavioral disorders: Secondary | ICD-10-CM | POA: Diagnosis not present

## 2023-02-11 DIAGNOSIS — Z0000 Encounter for general adult medical examination without abnormal findings: Secondary | ICD-10-CM | POA: Diagnosis not present

## 2023-02-11 DIAGNOSIS — I10 Essential (primary) hypertension: Secondary | ICD-10-CM | POA: Diagnosis not present

## 2023-05-03 DIAGNOSIS — K080 Exfoliation of teeth due to systemic causes: Secondary | ICD-10-CM | POA: Diagnosis not present

## 2023-05-26 DIAGNOSIS — H40023 Open angle with borderline findings, high risk, bilateral: Secondary | ICD-10-CM | POA: Diagnosis not present

## 2023-07-22 ENCOUNTER — Ambulatory Visit: Admitting: Podiatry

## 2023-07-22 DIAGNOSIS — B351 Tinea unguium: Secondary | ICD-10-CM | POA: Diagnosis not present

## 2023-07-22 DIAGNOSIS — B49 Unspecified mycosis: Secondary | ICD-10-CM

## 2023-07-22 DIAGNOSIS — L600 Ingrowing nail: Secondary | ICD-10-CM

## 2023-07-22 DIAGNOSIS — A499 Bacterial infection, unspecified: Secondary | ICD-10-CM | POA: Diagnosis not present

## 2023-07-22 DIAGNOSIS — L603 Nail dystrophy: Secondary | ICD-10-CM | POA: Diagnosis not present

## 2023-07-22 DIAGNOSIS — L608 Other nail disorders: Secondary | ICD-10-CM | POA: Diagnosis not present

## 2023-07-22 NOTE — Progress Notes (Unsigned)
 Subjective:   Patient ID: Eric Case, male   DOB: 78 y.o.   MRN: [id number]   HPI Chief Complaint  Patient presents with   Ingrown Toenail    RM#14 Left foot big toe ingrown discolored as well.   78 year old male presents the office today for concerns of his left big toenail getting discolored.  They think it may be starting get ingrown.  Does not see any swelling or redness or any drainage.  No recent treatment.  No injuries.   Review of Systems  All other systems reviewed and are negative.  Past Medical History:  Diagnosis Date   Anemia    on Iron- comes and goes per patient    Arthritis    Cataract    starting but very small- RIGHT REMOVED   Complication of anesthesia    severe headache after esophagus stretched years ago   Diverticulitis    Diverticulosis    GERD (gastroesophageal reflux disease)    Hemorrhoids    Hiatal hernia    Hx of adenomatous colonic polyps    Hypothyroidism    Migraine, unspecified, not intractable, without status migrainosus    Peptic stricture of esophagus     Past Surgical History:  Procedure Laterality Date   ACHILLES TENDON SURGERY Right 03/19/2021   Procedure: Right Achilles tendon reconstruction with autograft FHL transfer to the calcaneus; gastroc recession;  Surgeon: Amada Backer, MD;  Location: [facility];  Service: Orthopedics;  Laterality: Right;    CATARACT EXTRACTION Right    COLONOSCOPY     KNEE SURGERY Right    tendon tear    MENISCUS REPAIR Right    2015 and May 2017   POLYPECTOMY     SHOULDER SURGERY     bilateral   TOTAL KNEE ARTHROPLASTY Right 04/02/2016   Procedure: RIGHT TOTAL KNEE ARTHROPLASTY;  Surgeon: Genevie Kerns, MD;  Location: [facility];  Service: Orthopedics;  Laterality: Right;  Adductor Block   UPPER GASTROINTESTINAL ENDOSCOPY       Current Outpatient Medications:    co-enzyme Q-10 30 MG capsule, Take 30 mg by mouth daily. , Disp: , Rfl:    lansoprazole (PREVACID) 15 MG  capsule, Take 15 mg by mouth daily., Disp: , Rfl:    levothyroxine  (SYNTHROID , LEVOTHROID) 112 MCG tablet, Take 112 mcg by mouth daily.  , Disp: , Rfl:    Multiple Vitamin (MULTIVITAMIN WITH MINERALS) TABS tablet, Take 1 tablet by mouth daily., Disp: , Rfl:    Nutritional Supplements (SALMON OIL) CAPS, Take 1 capsule by mouth daily., Disp: , Rfl:    Saw Palmetto, Serenoa repens, (SAW PALMETTO PO), Take 250 mg by mouth 2 (two) times daily. , Disp: , Rfl:   Allergies  Allergen Reactions   Ciprofloxacin Other (See Comments)    chest pain   Diclofenac Sodium Other (See Comments)    "made me feel bad all over", Flu like symptoms/fever   Doxycycline  Other (See Comments)   Hydromorphone Itching   Lithium Benzoate [Lithium]     Dizziness and Passed Out   Nsaids     Other reaction(s): Unknown   Oxycodone -Acetaminophen  Itching    REACTION: itching   Pennsaid [Diclofenac Sodium] Other (See Comments)    "made me feel bad all over" Flu like symptoms/fever    Percocet [Oxycodone -Acetaminophen ]     unknown   Sulfamethoxazole-Trimethoprim Rash    septra          Objective:  Physical Exam  General: AAO x3, NAD  Dermatological: On the left hallux there is mild incurvation of the distal portion of the toenail.  The nail itself is hypertrophic, dystrophic with yellow, brown discoloration.  There is no edema, erythema or any signs of infection noted today.  Vascular: Dorsalis Pedis artery and Posterior Tibial artery pedal pulses are 2/4 bilateral with immedate capillary fill time.  There is no pain with calf compression, swelling, warmth, erythema.   Neruologic: Grossly intact via light touch bilateral.   Musculoskeletal: Toenail distally along the ingrown toenail.      Assessment:   Ingrown toenail, onychomycosis     Plan:  -Treatment options discussed including all alternatives, risks, and complications -Etiology of symptoms were discussed - We discussed partial nail avulsion he  like to hold off on this and try debridement.  I sharply debrided the nail without any complications or bleeding.  There is no significant after debridement.  I did send the clippings for culture, pathology to [facility] to evaluate for ongoing abscess -If symptoms persist would recommend at least partial nail avulsion.  Charity Conch DPM

## 2023-07-22 NOTE — Patient Instructions (Signed)
 Ingrown Toenail  An ingrown toenail occurs when the corner or sides of a toenail grow into the surrounding skin. This causes discomfort and pain. The big toe is most commonly affected, but any of the toes can be affected. If an ingrown toenail is not treated, it can become infected. What are the causes? This condition may be caused by: Wearing shoes that are too small or tight. An injury, such as stubbing your toe or having your toe stepped on. Improper cutting or care of your toenails. Having nail or foot abnormalities that were present from birth (congenital abnormalities), such as having a nail that is too big for your toe. What increases the risk? The following factors may make you more likely to develop ingrown toenails: Age. Nails tend to get thicker with age, so ingrown nails are more common among older people. Cutting your toenails incorrectly, such as cutting them very short or cutting them unevenly. An ingrown toenail is more likely to get infected if you have: Diabetes. Blood flow (circulation) problems. What are the signs or symptoms? Symptoms of an ingrown toenail may include: Pain, soreness, or tenderness. Redness. Swelling. Hardening of the skin that surrounds the toenail. Signs that an ingrown toenail may be infected include: Fluid or pus. Symptoms that get worse. How is this diagnosed? Ingrown toenails may be diagnosed based on: Your symptoms and medical history. A physical exam. Labs or tests. If you have fluid or blood coming from your toenail, a sample may be collected to test for the specific type of bacteria that is causing the infection. How is this treated? Treatment depends on the severity of your symptoms. You may be able to care for your toenail at home. If you have an infection, you may be prescribed antibiotic medicines. If you have fluid or pus draining from your toenail, your health care provider may drain it. If you have trouble walking, you may be  given crutches to use. If you have a severe or infected ingrown toenail, you may need a procedure to remove part or all of the nail. Follow these instructions at home: Foot care  Check your wound every day for signs of infection, or as often as told by your health care provider. Check for: More redness, swelling, or pain. More fluid or blood. Warmth. Pus or a bad smell. Do not pick at your toenail or try to remove it yourself. Soak your foot in warm, soapy water . Do this for 20 minutes, 3 times a day, or as often as told by your health care provider. This helps to keep your toe clean and your skin soft. Wear shoes that fit well and are not too tight. Your health care provider may recommend that you wear open-toed shoes while you heal. Trim your toenails regularly and carefully. Cut your toenails straight across to prevent injury to the skin at the corners of the toenail. Do not cut your nails in a curved shape. Keep your feet clean and dry to help prevent infection. General instructions Take over-the-counter and prescription medicines only as told by your health care provider. If you were prescribed an antibiotic, take it as told by your health care provider. Do not stop taking the antibiotic even if you start to feel better. If your health care provider told you to use crutches to help you move around, use them as instructed. Return to your normal activities as told by your health care provider. Ask your health care provider what activities are safe for you.  Keep all follow-up visits. This is important. Contact a health care provider if: You have more redness, swelling, pain, or other symptoms that do not improve with treatment. You have fluid, blood, or pus coming from your toenail. You have a red streak on your skin that starts at your foot and spreads up your leg. You have a fever. Summary An ingrown toenail occurs when the corner or sides of a toenail grow into the surrounding skin.  This causes discomfort and pain. The big toe is most commonly affected, but any of the toes can be affected. If an ingrown toenail is not treated, it can become infected. Fluid or pus draining from your toenail is a sign of infection. Your health care provider may need to drain it. You may be given antibiotics to treat the infection. Trimming your toenails regularly and properly can help you prevent an ingrown toenail. This information is not intended to replace advice given to you by your health care provider. Make sure you discuss any questions you have with your health care provider. Document Revised: 07/08/2020 Document Reviewed: 07/08/2020 Elsevier Patient Education  2024 [employer] -- Fungal Nail Infection A fungal nail infection is a common infection of the toenails or fingernails. This condition affects toenails more often than fingernails. It often affects the great, or big, toes. More than one nail may be infected. The condition can be passed from person to person (is contagious). What are the causes? This condition is caused by a fungus, such as yeast or molds. Several types of fungi can cause the infection. These fungi are common in moist and warm areas. If your hands or feet come into contact with the fungus, it may get into a crack in your fingernail or toenail or in the surrounding skin, and cause an infection. What increases the risk? The following factors may make you more likely to develop this condition: Being of older age. Having certain medical conditions, such as: Athlete's foot. Diabetes. Poor circulation. A weak body defense system (immune system). Walking barefoot in areas where the fungus thrives, such as showers or locker rooms. Wearing shoes and socks that cause your feet to sweat. Having a nail injury or a recent nail surgery. What are the signs or symptoms? Symptoms of this condition include: A pale spot on the nail. Thickening of the nail. A nail that  becomes yellow, brown, or white. A brittle or ragged nail edge. A nail that has lifted away from the nail bed. How is this diagnosed? This condition is diagnosed with a physical exam. Your health care provider may take a scraping or clipping from your nail to test for the fungus. How is this treated? Treatment is not needed for mild infections. If you have significant nail changes, treatment may include: Antifungal medicines taken by mouth (orally). You may need to take the medicine for several weeks or several months, and you may not see the results for a long time. These medicines can cause side effects. Ask your health care provider what problems to watch for. Antifungal nail polish or nail cream. These may be used along with oral antifungal medicines. Laser treatment of the nail. Surgery to remove the nail. This may be needed for the most severe infections. It can take a long time, usually up to a year, for the infection to go away. The infection may also come back. Follow these instructions at home: Medicines Take or apply over-the-counter and prescription medicines only as told by your health care  provider. Ask your health care provider about using over-the-counter mentholated ointment on your nails. Nail care Trim your nails often. Wash and dry your hands and feet every day. Keep your feet dry. To do this: Wear absorbent socks, and change your socks frequently. Wear shoes that allow air to circulate, such as sandals or canvas tennis shoes. Throw out old shoes. If you go to a nail salon, make sure you choose one that uses clean instruments. Use antifungal foot powder on your feet and in your shoes. General instructions Do not share personal items, such as towels or nail clippers. Do not walk barefoot in shower rooms or locker rooms. Wear rubber gloves if you are working with your hands in wet areas. Keep all follow-up visits. This is important. Contact a health care provider  if: You have redness, pain, or pus near the toenail or fingernail. Your infection is not getting better, or it is getting worse after several months. You have more circulation problems near the toenail or fingernail. You have brown or black discoloration of the nail that spreads to the surrounding skin. Summary A fungal nail infection is a common infection of the toenails or fingernails. Treatment is not needed for mild infections. If you have significant nail changes, treatment may include taking medicine orally and applying medicine to your nails. It can take a long time, usually up to a year, for the infection to go away. The infection may also come back. Take or apply over-the-counter and prescription medicines only as told by your health care provider. This information is not intended to replace advice given to you by your health care provider. Make sure you discuss any questions you have with your health care provider. Document Revised: 06/09/2020 Document Reviewed: 06/09/2020 Elsevier Patient Education  2024 [employer].

## 2023-07-28 ENCOUNTER — Encounter: Payer: Self-pay | Admitting: Podiatry

## 2023-07-28 ENCOUNTER — Other Ambulatory Visit: Payer: Self-pay | Admitting: Podiatry

## 2023-07-28 MED ORDER — GENTAMICIN SULFATE 0.1 % EX OINT: 1.0000 | TOPICAL_OINTMENT | Freq: Three times a day (TID) | CUTANEOUS | 0 refills | Status: AC

## 2023-08-26 NOTE — Progress Notes (Signed)
 [facility]  PATIENT: Eric Case DOB: 1945-11-08  REFERRING CLINICIAN: Barnetta Liberty, MD HISTORY FROM: self, wife Abe Abed REASON FOR VISIT: memory loss   HISTORICAL  CHIEF COMPLAINT:  Chief Complaint  Patient presents with   Follow-up    Pt in room 3. Wife in room. Here for memory follow up. Wife and pt said memory stable.    FOLLOW UP VISIT  Prior visit: 02/09/2023  Brief HPI:  Eric Case is a 78 y.o. male who is being followed by Dr. Billy Bue for memory loss since 01/2021 after COVID likely in setting of dementia.  MRI brain 11/2021 showed moderate to severe atrophy, chronic small vessel disease and cerebral microhemorrhages felt to be associated with chronic small vessel ischemic disease.  He also noted imbalance with exam showing decreased pinprick and vibration to bilateral feet likely contributing, A1c 6.0, B12 WNL. Had another MRI 02/2022 which showed an 11 mm focus of hemosiderin which was new from MRI in September, concerning for cerebral amyloid angiopathy.  Possible TIA vs amyloid spell 07/2022 after episode of transient expressive aphasia.  72-hour EEG no evidence of seizures.  At prior visit, reported recurrent transient aphasia 1 week prior to visit lasted about 30 minutes, no other associated symptoms.  Discussed trial of ASM but declined interest as episodes infrequent.    Interval history:  Returns for follow up visit accompanied by his wife.  No recurrent episodes of aphasia or any other new stroke/TIA symptoms. Does have occasional difficulty finding the correct word but denies any worsening. Reports cognition has been over stable since prior visit. Wife mentions some increased difficulty at times with certain tasks such as getting a bowl of cereal (remembering to get all the items needed such as bowl, spoon, cereal and milk) or brushing his teeth (will grab shaving creams instead of the toothpaste) but eventually is able to figure it out.   Continues to maintain ADLs independently. No longer driving due to concern of recurrent transient event while driving. No behavioral concerns, no changes in personality. MOCA toady 6/30 (prior 12/30) - wife believes this test is not accurate at his actual cognitive impairment due to difficulty coming up with the right word (naming animals), he knows what it is but has difficulty getting it out.  He recently took a trip to [facility] and remember the directions on how to get there (wife was driving).  Reports he sleeps well and has a good appetite.  Blood pressure well-controlled.  Routinely follows with PCP.      REVIEW OF SYSTEMS: Full 14 system review of systems performed and negative with exception of those listed in HPI  ALLERGIES: Allergies  Allergen Reactions   Ciprofloxacin Other (See Comments)    chest pain   Diclofenac Sodium Other (See Comments)    "made me feel bad all over", Flu like symptoms/fever   Doxycycline  Other (See Comments)   Hydromorphone Itching   Lithium Benzoate [Lithium]     Dizziness and Passed Out   Nsaids     Other reaction(s): Unknown   Oxycodone -Acetaminophen  Itching    REACTION: itching   Pennsaid [Diclofenac Sodium] Other (See Comments)    "made me feel bad all over" Flu like symptoms/fever    Percocet [Oxycodone -Acetaminophen ]     unknown   Sulfamethoxazole-Trimethoprim Rash    septra    HOME MEDICATIONS: Outpatient Medications Prior to Visit  Medication Sig Dispense Refill   co-enzyme Q-10 30 MG capsule Take 30 mg by mouth  daily.      gentamicin  ointment (GARAMYCIN ) 0.1 % Apply 1 Application topically 3 (three) times daily. (Patient not taking: Reported on 08/29/2023) 15 g 0   Grape Seed OIL by Does not apply route. 1 pill daily     lansoprazole (PREVACID) 15 MG capsule Take 15 mg by mouth daily.     levothyroxine  (SYNTHROID , LEVOTHROID) 112 MCG tablet Take 112 mcg by mouth daily.       Multiple Vitamin (MULTIVITAMIN WITH MINERALS) TABS  tablet Take 1 tablet by mouth daily.     Nutritional Supplements (SALMON OIL) CAPS Take 1 capsule by mouth daily.     Saw Palmetto, Serenoa repens, (SAW PALMETTO PO) Take 250 mg by mouth 2 (two) times daily.      No facility-administered medications prior to visit.    PAST MEDICAL HISTORY: Past Medical History:  Diagnosis Date   Anemia    on Iron- comes and goes per patient    Arthritis    Cataract    starting but very small- RIGHT REMOVED   Complication of anesthesia    severe headache after esophagus stretched years ago   Diverticulitis    Diverticulosis    GERD (gastroesophageal reflux disease)    Hemorrhoids    Hiatal hernia    Hx of adenomatous colonic polyps    Hypothyroidism    Migraine, unspecified, not intractable, without status migrainosus    Peptic stricture of esophagus     PAST SURGICAL HISTORY: Past Surgical History:  Procedure Laterality Date   ACHILLES TENDON SURGERY Right 03/19/2021   Procedure: Right Achilles tendon reconstruction with autograft FHL transfer to the calcaneus; gastroc recession;  Surgeon: Amada Backer, MD;  Location: [facility];  Service: Orthopedics;  Laterality: Right;    CATARACT EXTRACTION Right    COLONOSCOPY     KNEE SURGERY Right    tendon tear    MENISCUS REPAIR Right    2015 and May 2017   POLYPECTOMY     SHOULDER SURGERY     bilateral   TOTAL KNEE ARTHROPLASTY Right 04/02/2016   Procedure: RIGHT TOTAL KNEE ARTHROPLASTY;  Surgeon: Genevie Kerns, MD;  Location: [facility];  Service: Orthopedics;  Laterality: Right;  Adductor Block   UPPER GASTROINTESTINAL ENDOSCOPY      FAMILY HISTORY: Family History  Problem Relation Age of Onset   Breast cancer Mother    Heart disease Mother    Liver cancer Father    Prostate cancer Father    Prostate cancer Maternal Uncle    Prostate cancer Maternal Uncle    Prostate cancer Maternal Uncle    Colon cancer Cousin    Colon polyps Neg Hx    Esophageal cancer Neg Hx     Rectal cancer Neg Hx    Stomach cancer Neg Hx     SOCIAL HISTORY: Social History   Socioeconomic History   Marital status: Married    Spouse name: Abe Abed   Number of children: 1   Years of education: Not on file   Highest education level: Associate degree: occupational, Scientist, product/process development, or vocational program  Occupational History   Occupation: carpenter-retired  Tobacco Use   Smoking status: Never   Smokeless tobacco: Never  Vaping Use   Vaping status: Never Used  Substance and Sexual Activity   Alcohol  use: No    Alcohol /week: 0.0 standard drinks of alcohol    Drug use: No   Sexual activity: Not on file  Other Topics Concern   Not on file  Social History Narrative   11/09/21 Live with wife   Daily caffeine   Social Drivers of Health   Financial Resource Strain: Not on file  Food Insecurity: Patient Declined (08/17/2022)   Hunger Vital Sign    Worried About Running Out of Food in the Last Year: Patient declined    Ran Out of Food in the Last Year: Patient declined  Transportation Needs: No Transportation Needs (08/17/2022)   PRAPARE - Administrator, Civil Service (Medical): No    Lack of Transportation (Non-Medical): No  Physical Activity: Not on file  Stress: Not on file  Social Connections: Not on file  Intimate Partner Violence: Not At Risk (08/17/2022)   Humiliation, Afraid, Rape, and Kick questionnaire    Fear of Current or Ex-Partner: No    Emotionally Abused: No    Physically Abused: No    Sexually Abused: No     PHYSICAL EXAM  GENERAL EXAM/CONSTITUTIONAL: Vitals:  Vitals:   08/29/23 1114  BP: 126/85  Pulse: 85  Weight: 209 lb 6.4 oz (95 kg)  Height: 5\' 8"  (1.727 m)     NEUROLOGIC: MENTAL STATUS: Unable to appreciate any significant aphasia today.  Follows commands without difficulty.  No evidence of dysarthria.    08/29/2023   11:19 AM 02/09/2023    2:15 PM 06/16/2022    3:55 PM 03/16/2022    8:12 AM 11/09/2021    1:26 PM  [place]  Cognitive Assessment   Visuospatial/ Executive (0/5) 0 0 0 0 2  Naming (0/3) 0 1 1 2 1   Attention: Read list of digits (0/2) 1 2 2 1 1   Attention: Read list of letters (0/1) 0 0 1 1 1   Attention: Serial 7 subtraction starting at 100 (0/3) 0 0 1 1 2   Language: Repeat phrase (0/2) 1 1 2 2 1   Language : Fluency (0/1) 0 0 1 1 0  Abstraction (0/2) 1 2 1 2 2   Delayed Recall (0/5) 0 1 0 0 0  Orientation (0/6) 3 5 4 4 4   Total 6 12 13 14 14   Adjusted Score (based on education)   13 14     CRANIAL NERVE:  2nd, 3rd, 4th, 6th - pupils equal and reactive to light, visual fields full to confrontation, extraocular muscles intact, no nystagmus 5th - facial sensation symmetric 7th - facial strength symmetric 8th - hearing intact 9th - palate elevates symmetrically, uvula midline 11th - shoulder shrug symmetric 12th - tongue protrusion midline  MOTOR:  normal bulk and tone, no cogwheeling, full strength in the BUE, BLE, no evidence of action or resting tremor  COORDINATION:  finger-nose-finger, fine finger movements normal, no tremor  REFLEXES:  deep tendon reflexes present and symmetric  GAIT/STATION:  Wide based gait, stooped posture, mild unsteadiness with turns, no use of AD     DIAGNOSTIC DATA (LABS, IMAGING, TESTING) - I reviewed patient records, labs, notes, testing and imaging myself where available.  MRI brain w/wo contrast 11/27/2021 IMPRESSION: MRI brain with and without contrast demonstrating: - Moderate to severe atrophy with ventriculomegaly on ex vacuo basis.   - Moderate chronic small vessel ischemic disease.  Approximately 5 chronic cerebral microhemorrhages noted, can also be associated with chronic small vessel ischemic disease. - No acute findings.  08/17/2022 IMPRESSION: 1. No acute intracranial process. No evidence of acute or subacute infarct. 2. Innumerable foci of hemosiderin deposition in the cerebral hemispheres and cerebellar hemispheres, which are  primarily peripheral, and concerning for cerebral amyloid  angiopathy. Many of these were not seen on the prior exam, although that may be due to differences in technique. 3. Redemonstrated advanced cerebral atrophy for age and ventriculomegaly, slightly more prominent on the left than right. No definite lobar predominant volume loss. 4. Superficial siderosis in the bilateral frontal and parietal lobes, which was also less apparent the prior MRI, likely secondary to technique.   Lab Results  Component Value Date   WBC 9.3 08/18/2022   HGB 15.3 08/18/2022   HCT 46.4 08/18/2022   MCV 86.6 08/18/2022   PLT 268 08/18/2022      Component Value Date/Time   NA 138 08/18/2022 0428   K 3.5 08/18/2022 0428   CL 104 08/18/2022 0428   CO2 26 08/18/2022 0428   GLUCOSE 77 08/18/2022 0428   BUN 15 08/18/2022 0428   CREATININE 0.84 08/18/2022 0428   CALCIUM  8.9 08/18/2022 0428   PROT 7.3 08/17/2022 1558   ALBUMIN 4.1 08/17/2022 1558   AST 21 08/17/2022 1558   ALT 20 08/17/2022 1558   ALKPHOS 48 08/17/2022 1558   BILITOT 0.7 08/17/2022 1558   GFRNONAA >60 08/18/2022 0428   GFRAA >60 04/05/2016 0919        ASSESSMENT AND PLAN  78 y.o. year old male with a history of GERD and hypothyroidism who presents for evaluation of worsening memory loss and imbalance following COVID infection and a fall in November 2022.  Suspect memory loss likely in setting of CAA. He has had transient episodes of aphasia in 02/2022, 07/2022 and 01/2023 suspected to be in setting of amyloid spell, less likely TIA. 72 hr EEG negative for seizures.      - Subjectively cognition overall stable, MOCA today 6/30 (prior 12/30) - likely overestimates extent of cognition due to word finding difficulty.  - question possible underlying AD contributing and initiating donepezil or memantine but not interested at this time - Discussed importance of routine physical and cognitive exercises as well as healthy sleep, healthy  diet and management of vascular risk factors - Advised to call with any recurrent transient neurological symptoms     Return in about 7 months (around 03/30/2024). With MD to establish care (prior patient of Dr. Billy Bue)   I personally spent a total of 25 minutes in the care of the patient today including preparing to see the patient, performing a medically appropriate exam/evaluation, counseling and educating, and documenting clinical information in the EHR.  Johny Nap, AGNP-BC  [facility] [address] [place], [place] [address]  Phone [phone] Fax [phone] Note: This document was prepared with digital dictation and possible smart phrase technology. Any transcriptional errors that result from this process are unintentional.

## 2023-08-29 ENCOUNTER — Encounter: Payer: Self-pay | Admitting: Adult Health

## 2023-08-29 ENCOUNTER — Ambulatory Visit: Payer: Medicare Other | Admitting: Adult Health

## 2023-08-29 VITALS — BP 126/85 | HR 85 | Ht 68.0 in | Wt 209.4 lb

## 2023-08-29 DIAGNOSIS — E854 Organ-limited amyloidosis: Secondary | ICD-10-CM

## 2023-08-29 DIAGNOSIS — I680 Cerebral amyloid angiopathy: Secondary | ICD-10-CM

## 2023-08-29 DIAGNOSIS — R479 Unspecified speech disturbances: Secondary | ICD-10-CM

## 2023-08-29 DIAGNOSIS — F03B0 Unspecified dementia, moderate, without behavioral disturbance, psychotic disturbance, mood disturbance, and anxiety: Secondary | ICD-10-CM | POA: Diagnosis not present

## 2023-08-29 NOTE — Patient Instructions (Addendum)
 Your Plan:   Please let me know if you are interested in starting Aricept or Namenda which can help slow your memory decline  Please call with any recurrent neurological episodes       Follow up with Dr. Janett Medin in 7-8 months or call earlier if needed        Thank you for coming to see us  at [facility]. I hope we have been able to provide you high quality care today.  You may receive a patient satisfaction survey over the next few weeks. We would appreciate your feedback and comments so that we may continue to improve ourselves and the health of our patients.

## 2023-10-03 IMAGING — CR DG CHEST 2V
2 series · 2 of 2 positions shown · non-contrast
Comparison: Portable chest x-ray and CTA chest 04/05/2016.

CLINICAL DATA: 75-year-old who fell earlier today in his driveway
at home. Cough. Generalized weakness. Recent CCAVT-4D.

EXAM:
CHEST - 2 VIEW

[w chest pa]
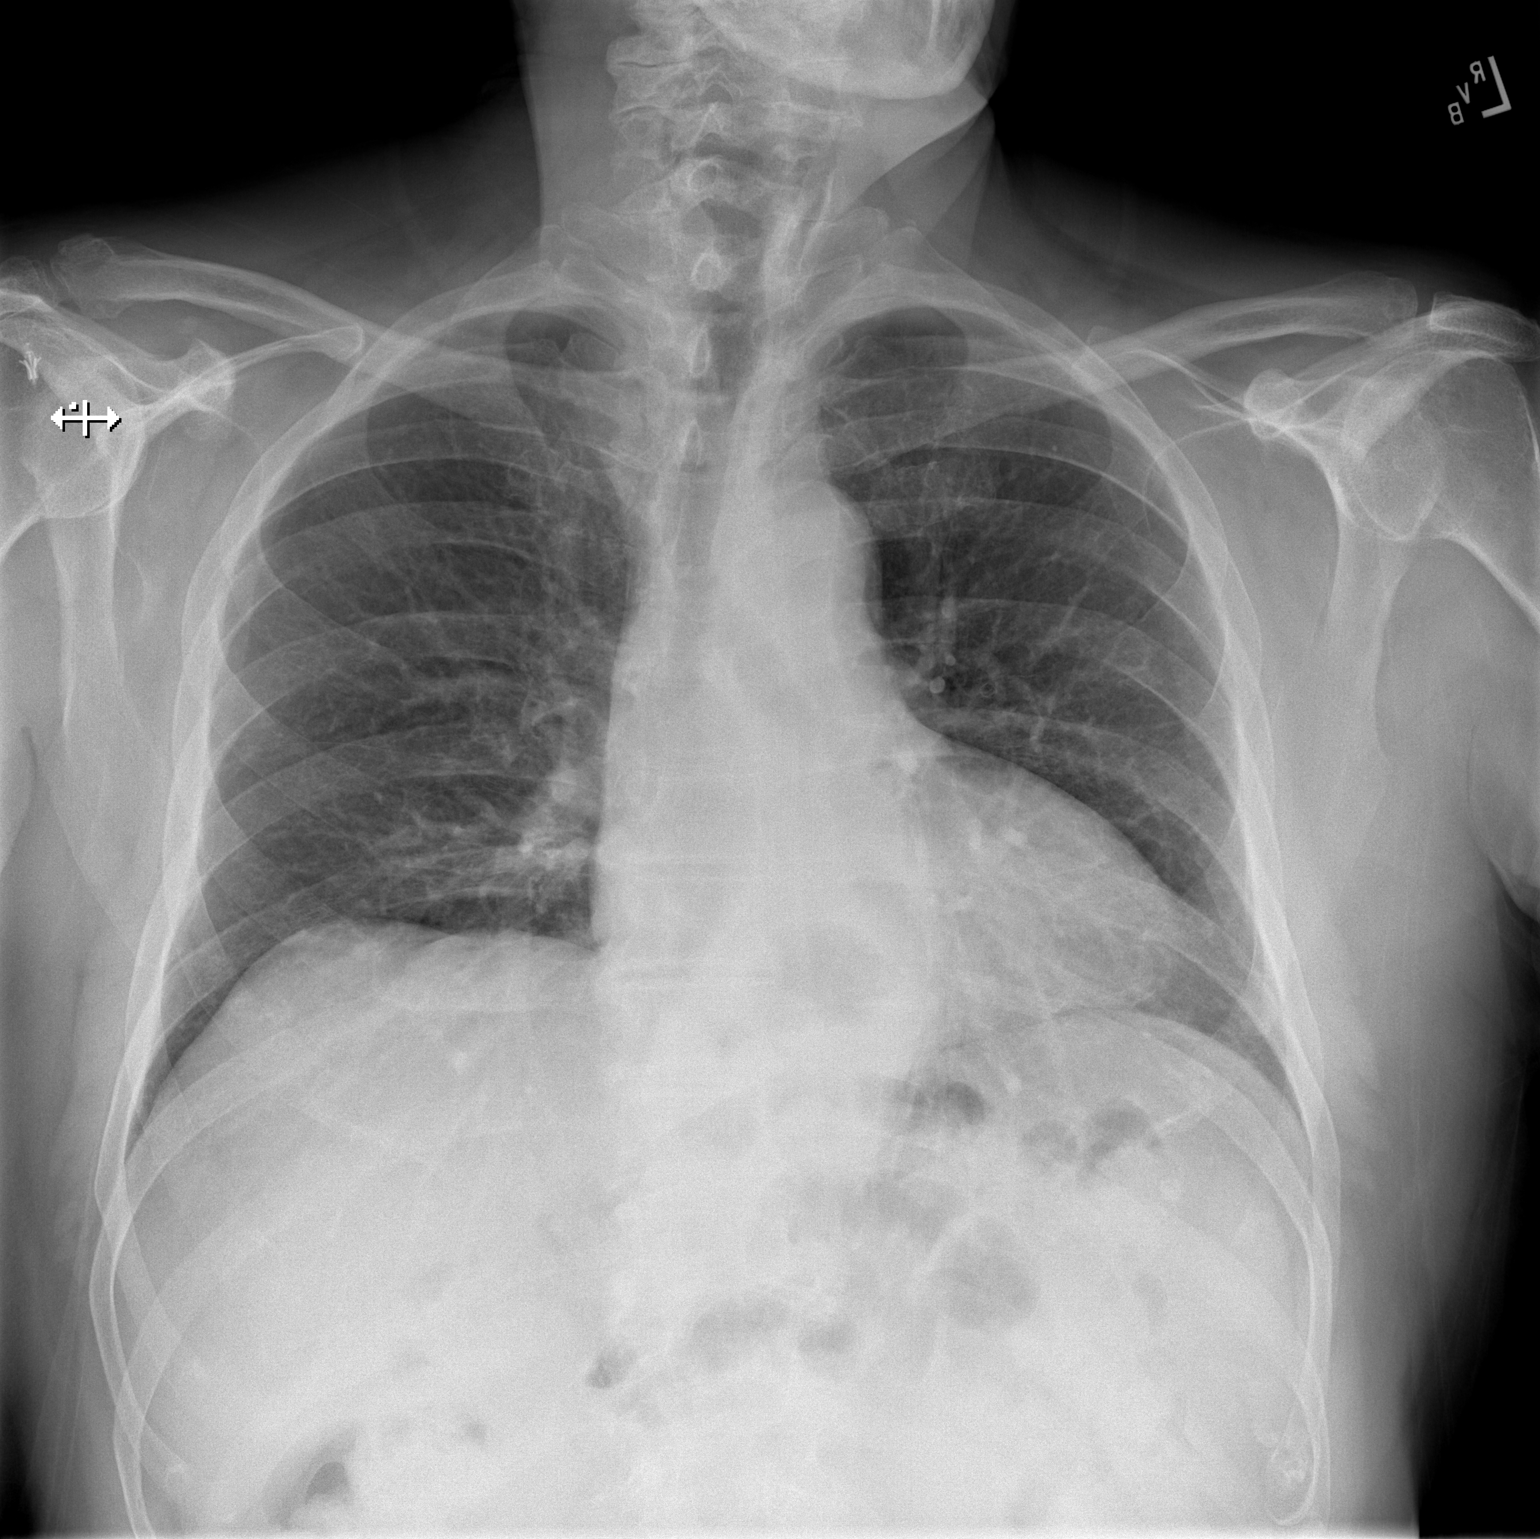

[w chest lat]
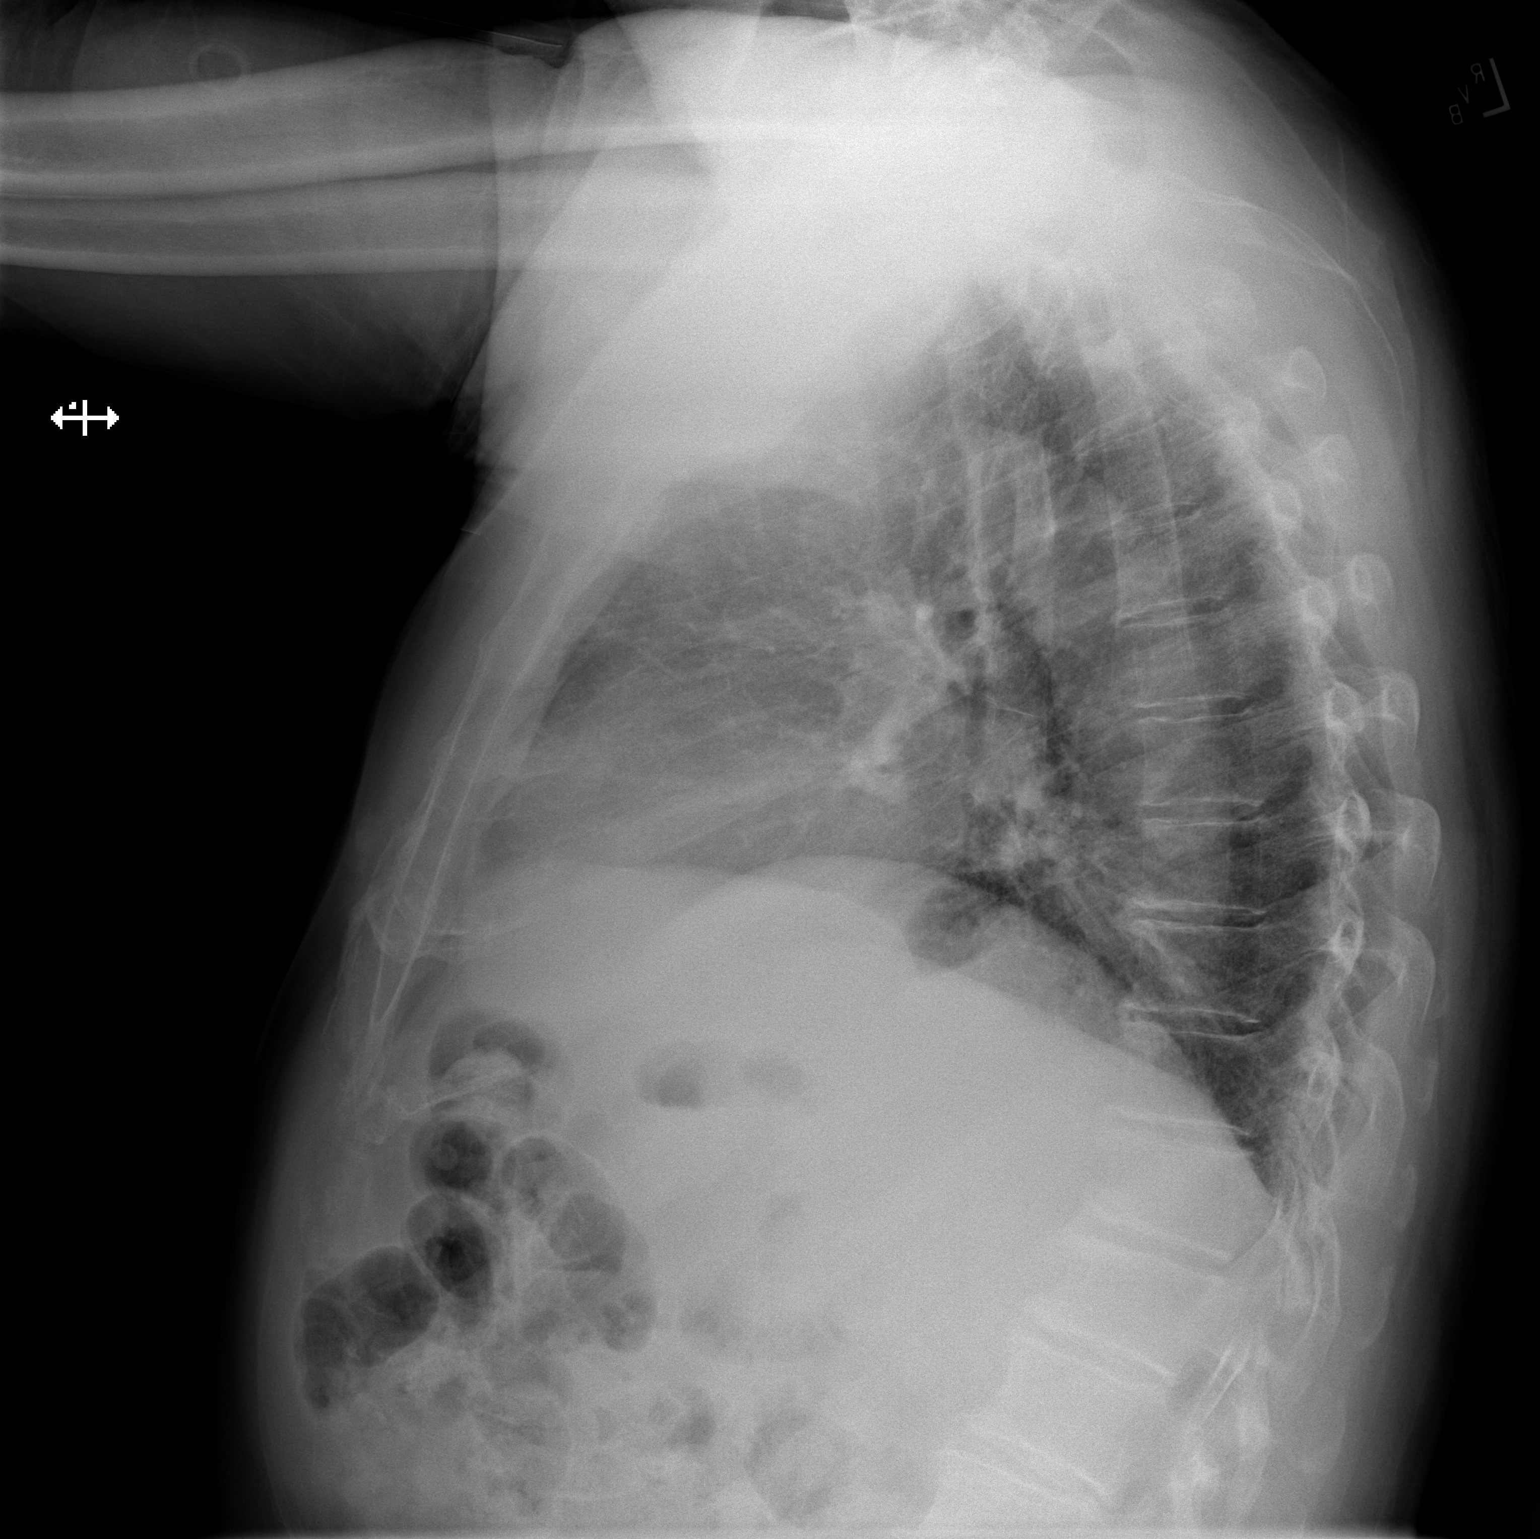

[2 of 2 positions shown; findings below may reference images not displayed]

FINDINGS: Cardiac silhouette upper normal in size, unchanged. Small hiatal
hernia, unchanged. Hilar and mediastinal contours otherwise
unremarkable. Lungs clear. Bronchovascular markings normal.
Pulmonary vascularity normal. No visible pleural effusions. No
pneumothorax. Degenerative changes involving the lower thoracic and
upper lumbar spine.
IMPRESSION: 1.  No acute cardiopulmonary disease.
2. Stable small hiatal hernia.

## 2023-11-02 DIAGNOSIS — H401131 Primary open-angle glaucoma, bilateral, mild stage: Secondary | ICD-10-CM | POA: Diagnosis not present

## 2023-11-04 DIAGNOSIS — H6123 Impacted cerumen, bilateral: Secondary | ICD-10-CM | POA: Diagnosis not present

## 2023-11-04 DIAGNOSIS — H9192 Unspecified hearing loss, left ear: Secondary | ICD-10-CM | POA: Diagnosis not present

## 2023-11-23 DIAGNOSIS — H25812 Combined forms of age-related cataract, left eye: Secondary | ICD-10-CM | POA: Diagnosis not present

## 2023-12-05 DIAGNOSIS — L821 Other seborrheic keratosis: Secondary | ICD-10-CM | POA: Diagnosis not present

## 2023-12-05 DIAGNOSIS — L720 Epidermal cyst: Secondary | ICD-10-CM | POA: Diagnosis not present

## 2023-12-05 DIAGNOSIS — L570 Actinic keratosis: Secondary | ICD-10-CM | POA: Diagnosis not present

## 2023-12-05 DIAGNOSIS — D2261 Melanocytic nevi of right upper limb, including shoulder: Secondary | ICD-10-CM | POA: Diagnosis not present

## 2023-12-05 DIAGNOSIS — Z85828 Personal history of other malignant neoplasm of skin: Secondary | ICD-10-CM | POA: Diagnosis not present

## 2023-12-07 DIAGNOSIS — Z01818 Encounter for other preprocedural examination: Secondary | ICD-10-CM | POA: Diagnosis not present

## 2023-12-19 DIAGNOSIS — H25812 Combined forms of age-related cataract, left eye: Secondary | ICD-10-CM | POA: Diagnosis not present

## 2023-12-19 DIAGNOSIS — F419 Anxiety disorder, unspecified: Secondary | ICD-10-CM | POA: Diagnosis not present

## 2023-12-19 DIAGNOSIS — E039 Hypothyroidism, unspecified: Secondary | ICD-10-CM | POA: Diagnosis not present

## 2023-12-19 DIAGNOSIS — H401121 Primary open-angle glaucoma, left eye, mild stage: Secondary | ICD-10-CM | POA: Diagnosis not present

## 2024-01-11 ENCOUNTER — Emergency Department (HOSPITAL_COMMUNITY)

## 2024-01-11 ENCOUNTER — Encounter (HOSPITAL_COMMUNITY): Payer: Self-pay

## 2024-01-11 ENCOUNTER — Emergency Department (HOSPITAL_COMMUNITY)
Admission: EM | Admit: 2024-01-11 | Discharge: 2024-01-11 | Disposition: A | Discharge status: Home / Self Care | Attending: Emergency Medicine | Admitting: Emergency Medicine

## 2024-01-11 ENCOUNTER — Other Ambulatory Visit: Payer: Self-pay

## 2024-01-11 DIAGNOSIS — K219 Gastro-esophageal reflux disease without esophagitis: Secondary | ICD-10-CM | POA: Diagnosis not present

## 2024-01-11 DIAGNOSIS — R200 Anesthesia of skin: Secondary | ICD-10-CM | POA: Diagnosis not present

## 2024-01-11 DIAGNOSIS — I680 Cerebral amyloid angiopathy: Secondary | ICD-10-CM | POA: Diagnosis not present

## 2024-01-11 DIAGNOSIS — D649 Anemia, unspecified: Secondary | ICD-10-CM | POA: Diagnosis not present

## 2024-01-11 DIAGNOSIS — R269 Unspecified abnormalities of gait and mobility: Secondary | ICD-10-CM

## 2024-01-11 DIAGNOSIS — Z79899 Other long term (current) drug therapy: Secondary | ICD-10-CM | POA: Diagnosis not present

## 2024-01-11 DIAGNOSIS — G9389 Other specified disorders of brain: Secondary | ICD-10-CM | POA: Diagnosis not present

## 2024-01-11 DIAGNOSIS — R2689 Other abnormalities of gait and mobility: Secondary | ICD-10-CM | POA: Insufficient documentation

## 2024-01-11 DIAGNOSIS — R4701 Aphasia: Secondary | ICD-10-CM

## 2024-01-11 DIAGNOSIS — I639 Cerebral infarction, unspecified: Secondary | ICD-10-CM | POA: Diagnosis not present

## 2024-01-11 DIAGNOSIS — Z79890 Hormone replacement therapy (postmenopausal): Secondary | ICD-10-CM | POA: Diagnosis not present

## 2024-01-11 DIAGNOSIS — R410 Disorientation, unspecified: Secondary | ICD-10-CM | POA: Diagnosis not present

## 2024-01-11 DIAGNOSIS — E854 Organ-limited amyloidosis: Secondary | ICD-10-CM | POA: Diagnosis not present

## 2024-01-11 DIAGNOSIS — E039 Hypothyroidism, unspecified: Secondary | ICD-10-CM | POA: Insufficient documentation

## 2024-01-11 DIAGNOSIS — R262 Difficulty in walking, not elsewhere classified: Secondary | ICD-10-CM | POA: Diagnosis not present

## 2024-01-11 DIAGNOSIS — K5730 Diverticulosis of large intestine without perforation or abscess without bleeding: Secondary | ICD-10-CM | POA: Diagnosis not present

## 2024-01-11 DIAGNOSIS — R319 Hematuria, unspecified: Secondary | ICD-10-CM | POA: Diagnosis not present

## 2024-01-11 DIAGNOSIS — R9431 Abnormal electrocardiogram [ECG] [EKG]: Secondary | ICD-10-CM | POA: Diagnosis not present

## 2024-01-11 DIAGNOSIS — I491 Atrial premature depolarization: Secondary | ICD-10-CM | POA: Diagnosis not present

## 2024-01-11 DIAGNOSIS — Z9889 Other specified postprocedural states: Secondary | ICD-10-CM | POA: Diagnosis not present

## 2024-01-11 DIAGNOSIS — E859 Amyloidosis, unspecified: Secondary | ICD-10-CM | POA: Diagnosis not present

## 2024-01-11 DIAGNOSIS — R531 Weakness: Secondary | ICD-10-CM | POA: Diagnosis not present

## 2024-01-11 DIAGNOSIS — W010XXA Fall on same level from slipping, tripping and stumbling without subsequent striking against object, initial encounter: Secondary | ICD-10-CM | POA: Insufficient documentation

## 2024-01-11 DIAGNOSIS — R569 Unspecified convulsions: Secondary | ICD-10-CM | POA: Diagnosis not present

## 2024-01-11 DIAGNOSIS — I10 Essential (primary) hypertension: Secondary | ICD-10-CM | POA: Diagnosis not present

## 2024-01-11 HISTORY — DX: Organ-limited amyloidosis: I68.0

## 2024-01-11 LAB — I-STAT CHEM 8, ED
BUN: 28 mg/dL — ABNORMAL HIGH (ref 8–23)
Calcium, Ion: 1.16 mmol/L (ref 1.15–1.40)
Chloride: 103 mmol/L (ref 98–111)
Creatinine, Ser: 1.1 mg/dL (ref 0.61–1.24)
Glucose, Bld: 127 mg/dL — ABNORMAL HIGH (ref 70–99)
HCT: 47 % (ref 39.0–52.0)
Hemoglobin: 16 g/dL (ref 13.0–17.0)
Potassium: 3.9 mmol/L (ref 3.5–5.1)
Sodium: 139 mmol/L (ref 135–145)
TCO2: 24 mmol/L (ref 22–32)

## 2024-01-11 LAB — COMPREHENSIVE METABOLIC PANEL WITH GFR
ALT: 17 U/L (ref 0–44)
AST: 20 U/L (ref 15–41)
Albumin: 3.7 g/dL (ref 3.5–5.0)
Alkaline Phosphatase: 47 U/L (ref 38–126)
Anion gap: 9 (ref 5–15)
BUN: 24 mg/dL — ABNORMAL HIGH (ref 8–23)
CO2: 21 mmol/L — ABNORMAL LOW (ref 22–32)
Calcium: 8.6 mg/dL — ABNORMAL LOW (ref 8.9–10.3)
Chloride: 106 mmol/L (ref 98–111)
Creatinine, Ser: 1.09 mg/dL (ref 0.61–1.24)
GFR, Estimated: >60 mL/min (ref >60)
Glucose, Bld: 125 mg/dL — ABNORMAL HIGH (ref 70–99)
Potassium: 3.9 mmol/L (ref 3.5–5.1)
Sodium: 136 mmol/L (ref 135–145)
Total Bilirubin: 0.7 mg/dL (ref 0.0–1.2)
Total Protein: 6.6 g/dL (ref 6.5–8.1)

## 2024-01-11 LAB — PROTIME-INR
INR: 1 (ref 0.8–1.2)
Prothrombin Time: 14.1 s (ref 11.4–15.2)

## 2024-01-11 LAB — URINALYSIS, W/ REFLEX TO CULTURE (INFECTION SUSPECTED)
Bacteria, UA: NONE SEEN
Bilirubin Urine: NEGATIVE
Glucose, UA: NEGATIVE mg/dL
Ketones, ur: NEGATIVE mg/dL
Leukocytes,Ua: NEGATIVE
Nitrite: NEGATIVE
Protein, ur: NEGATIVE mg/dL
Specific Gravity, Urine: 1.018 (ref 1.005–1.030)
pH: 6 (ref 5.0–8.0)

## 2024-01-11 LAB — DIFFERENTIAL
Abs Immature Granulocytes: 0.03 K/uL (ref 0.00–0.07)
Basophils Absolute: 0.1 K/uL (ref 0.0–0.1)
Basophils Relative: 2 %
Eosinophils Absolute: 0.3 K/uL (ref 0.0–0.5)
Eosinophils Relative: 3 %
Immature Granulocytes: 0 %
Lymphocytes Relative: 24 %
Lymphs Abs: 1.9 K/uL (ref 0.7–4.0)
Monocytes Absolute: 0.6 K/uL (ref 0.1–1.0)
Monocytes Relative: 7 %
Neutro Abs: 4.9 K/uL (ref 1.7–7.7)
Neutrophils Relative %: 64 %

## 2024-01-11 LAB — CBC
HCT: 46.8 % (ref 39.0–52.0)
Hemoglobin: 14.8 g/dL (ref 13.0–17.0)
MCH: 26.1 pg (ref 26.0–34.0)
MCHC: 31.6 g/dL (ref 30.0–36.0)
MCV: 82.5 fL (ref 80.0–100.0)
Platelets: 309 K/uL (ref 150–400)
RBC: 5.67 MIL/uL (ref 4.22–5.81)
RDW: 15.2 % (ref 11.5–15.5)
WBC: 7.7 K/uL (ref 4.0–10.5)
nRBC: 0 % (ref 0.0–0.2)

## 2024-01-11 LAB — CBG MONITORING, ED: Glucose-Capillary: 125 mg/dL — ABNORMAL HIGH (ref 70–99)

## 2024-01-11 LAB — APTT: aPTT: 33 s (ref 24–36)

## 2024-01-11 LAB — ETHANOL: Alcohol, Ethyl (B): <15 mg/dL (ref <15)

## 2024-01-11 MED ORDER — SODIUM CHLORIDE 0.9% FLUSH
3.0000 mL | Freq: Once | INTRAVENOUS | Status: AC
  Administered 2024-01-11: 3 mL via INTRAVENOUS

## 2024-01-11 NOTE — ED Notes (Signed)
 Patient verbalizes understanding of discharge instructions. Opportunity for questioning and answers were provided. Pt discharged from ED.

## 2024-01-11 NOTE — ED Provider Notes (Signed)
 Eric EMERGENCY DEPARTMENT AT [facility] Provider Note   CSN: [id number] Arrival date & time: 01/11/24  9182     Patient presents with: No chief complaint on file.   Eric Case is a 78 y.o. male.   HPI   78 year old male with medical history significant for Eric Case, Eric Eric Case, Eric Case emergency department as a code stroke.  Case patient presents with aphasia and right lower extremity weakness.  Last known normal 0730.  Case patient reportedly stumbled a little bit and fell forward, EMS was called because he is not quite acting himself.  He has word finding issues at baseline and has trouble communicating.  Has a history of cerebral amyloid angiopathy.  He denied feeling near syncopal, lightheaded, he has no chest pain, shortness of breath.  He denies any abdominal pain, nausea or vomiting.  He has been eating and drinking normally.  He denies any cough.  No recent fevers or chills.  Prior to Admission medications   Medication Sig Start Date End Date Taking? Authorizing Provider  gentamicin  ointment (GARAMYCIN ) 0.1 % Apply 1 Application topically 3 (three) times daily. Patient not taking: Reported on 08/29/2023 07/28/23   Gershon Donnice SAUNDERS, DPM  co-enzyme Q-10 30 MG capsule Take 30 mg by mouth daily.     [provider]  Grape Seed OIL by Does not apply route. 1 pill daily    [provider]  lansoprazole (PREVACID) 15 MG capsule Take 15 mg by mouth daily. 07/07/20   [provider]  levothyroxine  (SYNTHROID , LEVOTHROID) 112 MCG tablet Take 112 mcg by mouth daily.      [provider]  Multiple Vitamin (MULTIVITAMIN WITH MINERALS) TABS tablet Take 1 tablet by mouth daily.    [provider]  Nutritional Supplements (SALMON OIL) CAPS Take 1 capsule by mouth daily.    [provider]  Saw Palmetto, Serenoa repens, (SAW PALMETTO PO) Take 250 mg by mouth 2 (two) times  daily.     [provider]    Allergies: Ciprofloxacin, Diclofenac sodium, Doxycycline , Hydromorphone, Lithium benzoate [lithium], Nsaids, Oxycodone -acetaminophen , Pennsaid [diclofenac sodium], Percocet [oxycodone -acetaminophen ], and Sulfamethoxazole-trimethoprim    Review of Systems  Unable to perform ROS: Acuity of condition    Updated Vital Signs BP (!) 144/94   Pulse 65   Temp 97.8 F (36.6 C) (Oral)   Resp 13   Wt 94.6 kg   SpO2 97%   BMI 31.71 kg/m   Physical Exam Vitals and nursing note reviewed.  Constitutional:      General: He is not in acute distress. HENT:     Head: Normocephalic and atraumatic.  Eyes:     Conjunctiva/sclera: Conjunctivae normal.     Pupils: Pupils are equal, round, and reactive to light.  Cardiovascular:     Rate and Rhythm: Normal rate and regular rhythm.  Pulmonary:     Effort: Pulmonary effort is normal. No respiratory distress.     Breath sounds: Normal breath sounds.  Abdominal:     General: There is no distension.     Tenderness: There is no abdominal tenderness. There is no guarding.  Musculoskeletal:        General: No deformity or signs of injury.     Cervical back: Neck supple.  Skin:    Findings: No lesion or rash.  Neurological:     General: No focal deficit present.     Mental Status: He is alert and oriented to person,  place, and time. Mental status is at baseline.     Cranial Nerves: No cranial nerve deficit.     Sensory: No sensory deficit.     Motor: No weakness.     (all labs ordered are listed, but only abnormal results are displayed) Labs Reviewed  COMPREHENSIVE METABOLIC PANEL WITH GFR - Abnormal; Notable for Case following components:      Result Value   CO2 21 (*)    Glucose, Bld 125 (*)    BUN 24 (*)    Calcium  8.6 (*)    All other components within normal limits  I-STAT CHEM 8, ED - Abnormal; Notable for Case following components:   BUN 28 (*)    Glucose, Bld 127 (*)    All other components  within normal limits  CBG MONITORING, ED - Abnormal; Notable for Case following components:   Glucose-Capillary 125 (*)    All other components within normal limits  PROTIME-INR  APTT  CBC  DIFFERENTIAL  ETHANOL  URINALYSIS, W/ REFLEX TO CULTURE (INFECTION SUSPECTED)    EKG: EKG Interpretation Date/Time:  Wednesday January 11 2024 08:47:03 EDT Ventricular Rate:  76 PR Interval:  186 QRS Duration:  111 QT Interval:  415 QTC Calculation: 467 R Axis:   -38  Text Interpretation: Sinus rhythm Multiple ventricular premature complexes Incomplete RBBB and LAFB Abnormal R-wave progression, late transition Confirmed by Jerrol Agent ([id number]) on 01/11/2024 10:52:43 AM  Radiology: EEG adult Result Date: 01/11/2024 Shelton Arlin KIDD, MD     01/11/2024 10:59 AM Patient Name: Eric Case MRN: [id number] Epilepsy Attending: Arlin KIDD Shelton Referring Physician/Provider: Waddell Karna LABOR, NP Date: 01/11/2024 Duration: 22.42 mins Patient history: 78 y.o. male with hx of TIA, CAA, memory loss Eric Case, migraines, who presents to [facility] via EMS for acute onset decreased sensation on face and aphasia. EEG to evaluate for seizure Level of alertness: Awake AEDs during EEG study: None Technical aspects: This EEG study was done with scalp electrodes positioned according to Case 10-20 International system of electrode placement. Electrical activity was reviewed with band pass filter of 1-70Hz , sensitivity of 7 uV/mm, display speed of 41mm/sec with a 60Hz  notched filter applied as appropriate. EEG data were recorded continuously and digitally stored.  Video monitoring was available and reviewed as appropriate. Description: Case posterior dominant rhythm consists of 8 Hz activity of moderate voltage (25-35 uV) seen predominantly in posterior head regions, symmetric and reactive to eye opening and eye closing.  Hyperventilation and photic stimulation were not performed.    IMPRESSION: This study is within  normal limits. No seizures or epileptiform discharges were seen throughout Case recording.  A normal interictal EEG does not exclude Case diagnosis of epilepsy.  Arlin KIDD Shelton   CT HEAD CODE STROKE WO CONTRAST Result Date: 01/11/2024 EXAM: CT HEAD WITHOUT CONTRAST 08/18/2022 TECHNIQUE: CT of Case head was performed without Case administration of intravenous contrast. Automated exposure control, iterative reconstruction, and/or weight based adjustment of the mA/kV was utilized to reduce Case radiation dose to as low as reasonably achievable. COMPARISON: Brain MRI 08/16/2021 and Head CT 08/16/2021. CLINICAL HISTORY: 78 year old male. No gaze deviation. FINDINGS: BRAIN AND VENTRICLES: No acute hemorrhage. Stable cerebral volume, nonspecific ventriculomegaly. Confluent chronic cerebral white matter hypodensity. Stable gray-white differentiation since last year. No suspicious intracranial vascular hyperdensity. Subtle inferior temporal lobe cortical and subcortical malacia bilaterally is stable, on Case right coronal image 41. Mild chronic encephalomalacia right anterior inferior frontal gyrus also stable. No extra-axial collection. No mass  effect or midline shift. ORBITS: No acute abnormality. SINUSES: No acute abnormality. SOFT TISSUES AND SKULL: No acute soft tissue abnormality. No skull fracture. [place] stroke program early CT score (aspects) ----- Ganglionic (caudate, IC, lentiform nucleus, insula, M1-M3): 7 Supraganglionic (M4-M6): 3 Total: 10 IMPRESSION: 1. No acute intracranial abnormality. Stable from last year. ASPECTS 10. 2. These results were communicated to Dr. Vanessa at 08:34 hours on 09/02/2022 by text page via Case [facility] messaging system. Electronically signed by: Helayne Hurst MD 01/11/2024 08:38 AM EDT RP Workstation: [id number]     Procedures   Medications Ordered in Case ED  sodium chloride  flush (NS) 0.9 % injection 3 mL (3 mLs Intravenous Given 01/11/24 0846)                                     Medical Decision Making Amount and/or Complexity of Data Reviewed Labs: ordered. Radiology: ordered.    78 year old male with medical history significant for Eric Case, Eric Eric Case, Eric Case emergency department as a code stroke.  Case patient presents with aphasia and right lower extremity weakness.  Last known normal 0730.  Case patient reportedly stumbled a little bit and fell forward, EMS was called because he is not quite acting himself.  He has word finding issues at baseline and has trouble communicating.  Has a history of cerebral amyloid angiopathy.  He denied feeling near syncopal, lightheaded, he has no chest pain, shortness of breath.  He denies any abdominal pain, nausea or vomiting.  He has been eating and drinking normally.  He denies any cough.  No recent fevers or chills.  On arrival, Case patient was vitally stable, afebrile, not tachycardic or tachypneic, BP 145/98, saturating 95% on room air.  CBG 125 on arrival.  Case patient's airway was cleared at Case bridge and he was taken to Case CT scanner for code stroke imaging.  CT head: IMPRESSION:  1. No acute intracranial abnormality. Stable from last year. ASPECTS 10.   EKG: Sinus rhythm, ventricular rate 76, multiple PVCs, incomplete right bundle branch block present.  No STEMI.  Labs: CBC without a leukocytosis or Eric Case, CMP with mildly low bicarb at 21, creatinine at 1.09, LFTs normal, ethanol level normal.  Per neurology: Impression/Key exam findings/Plan: Noted aphasia today is baseline for him per discussion with wife [name]. With his known hx of CAA, tnkase is contraindicated and not offered. Low suspicion for stroke and therefore CTA head and neck not obtained. I dont think he is going to benefit from MRI brain. Even if he does have a new stroke, he is not going to be a candidate for antiplatelet or anticoagulation with his known hx of CAA. Case episode today with him  leaning forward and sitting down on Case driveway also is not very concerning for stroke.   Will get rEEG, check orthostatics. Rule out infections.  Orthostatics: Negative UA: Negative for UTI EEG: Obtained and read as normal  Pt symptomatically at his baseline on repeat assessment, reassuring workup.  He has had no chest pain or shortness of breath.  His lungs are clear to auscultation on exam.  He did not have an episode of near syncope, described more of a gait abnormality in Case setting of his known cerebral amyloid angiopathy.  His wife clarifies that he is currently at baseline.  Ambulatory at baseline and mental status at baseline.  Overall stable for discharge  and outpatient follow-up.      Final diagnoses:  Gait abnormality    ED Discharge Orders     None          Jerrol Agent, MD 01/11/24 1312

## 2024-01-11 NOTE — ED Triage Notes (Signed)
 Pt bib ems from home; ems reports pt walking and suddenly began stumbling and feeling like he was going to fall; hx TIA, cerebral amyloid angiopathy; pt also aphasic, which wife reports is baseline; not on thinner; symptoms began around 0740; 150/80, hr 86, 97%; pt denies dizziness, weakness, sates not feeling right

## 2024-01-11 NOTE — Procedures (Signed)
 Patient Name: Eric Case  MRN: [id number]  Epilepsy Attending: Arlin MALVA Krebs  Referring Physician/Provider: Waddell Karna LABOR, NP  Date: 01/11/2024 Duration: 22.42 mins  Patient history: 78 y.o. male with hx of TIA, CAA, memory loss GERD, hypothyroidism, migraines, who presents to [facility] Ed via EMS for acute onset decreased sensation on face and aphasia. EEG to evaluate for seizure  Level of alertness: Awake  AEDs during EEG study: None  Technical aspects: This EEG study was done with scalp electrodes positioned according to the 10-20 International system of electrode placement. Electrical activity was reviewed with band pass filter of 1-70Hz , sensitivity of 7 uV/mm, display speed of 65mm/sec with a 60Hz  notched filter applied as appropriate. EEG data were recorded continuously and digitally stored.  Video monitoring was available and reviewed as appropriate.  Description: The posterior dominant rhythm consists of 8 Hz activity of moderate voltage (25-35 uV) seen predominantly in posterior head regions, symmetric and reactive to eye opening and eye closing.  Hyperventilation and photic stimulation were not performed.      IMPRESSION: This study is within normal limits. No seizures or epileptiform discharges were seen throughout the recording.   A normal interictal EEG does not exclude the diagnosis of epilepsy.    Yasser Hepp O Bethany Hirt

## 2024-01-11 NOTE — ED Notes (Signed)
 Pt ambulatory with stand by assist; pt noted to have a couple of minor unsteady steps; per wife, pt's gait is at baseline

## 2024-01-11 NOTE — ED Notes (Signed)
 CCMD called.

## 2024-01-11 NOTE — Consult Note (Signed)
 NEUROLOGY CONSULT NOTE   Date of service: January 11, 2024 Patient Name: Eric Case MRN:  [id number] DOB:  [birth year] Chief Complaint: Code stroke  Requesting Provider: Jerrol Agent, MD  History of Present Illness  Eric Case is a 78 y.o. male with hx of TIA, CAA, memory loss GERD, hypothyroidism, migraines, who presents to [facility] via EMS for acute onset decreased sensation on face and aphasia. EMS activated a code stroke. Spoke with wife, Eric Case via phone and she states LKW 0730. She states they were walking down the driveway when he suddenly kept leaning forward and stumbling a little. They turned around and he continued to lean forward while walking then started to sit on the ground. She called EMS because he seemed to not be acting himself. She states that he has word finding issues at baseline and has trouble communicating. She did not see a facial droop, no slurred speech or weakness. NIHSS 3  CT head with no acute process   LKW: 0730 Modified rankin score: 2-Slight disability-UNABLE to perform all activities but does not need assistance IV Thrombolysis:  No acute stroke  EVT:  low suspicion for stroke and therefore not offered.   NIHSS components Score: Comment  1a Level of Conscious 0[]  1[]  2[]  3[]      1b LOC Questions 0[]  1[]  2[x]       1c LOC Commands 0[]  1[]  2[]       2 Best Gaze 0[]  1[]  2[]       3 Visual 0[]  1[]  2[]  3[]      4 Facial Palsy 0[]  1[]  2[]  3[]      5a Motor Arm - left 0[]  1[]  2[]  3[]  4[]  UN[]    5b Motor Arm - Right 0[]  1[]  2[]  3[]  4[]  UN[]    6a Motor Leg - Left 0[]  1[]  2[]  3[]  4[]  UN[]    6b Motor Leg - Right 0[]  1[]  2[]  3[]  4[]  UN[]    7 Limb Ataxia 0[]  1[]  2[]  UN[]      8 Sensory 0[]  1[]  2[]  UN[]      9 Best Language 0[]  1[x]  2[]  3[]      10 Dysarthria 0[]  1[]  2[]  UN[]      11 Extinct. and Inattention 0[]  1[]  2[]       TOTAL: 3      ROS  Comprehensive ROS Unable to ascertain due to aphasia   Past History   Past Medical History:  Diagnosis Date    Anemia    on Iron- comes and goes per patient    Arthritis    Cataract    starting but very small- RIGHT REMOVED   Complication of anesthesia    severe headache after esophagus stretched years ago   Diverticulitis    Diverticulosis    GERD (gastroesophageal reflux disease)    Hemorrhoids    Hiatal hernia    Hx of adenomatous colonic polyps    Hypothyroidism    Migraine, unspecified, not intractable, without status migrainosus    Peptic stricture of esophagus     Past Surgical History:  Procedure Laterality Date   ACHILLES TENDON SURGERY Right 03/19/2021   Procedure: Right Achilles tendon reconstruction with autograft FHL transfer to the calcaneus; gastroc recession;  Surgeon: Kit Rush, MD;  Location: [facility];  Service: Orthopedics;  Laterality: Right;    CATARACT EXTRACTION Right    COLONOSCOPY     KNEE SURGERY Right    tendon tear    MENISCUS REPAIR Right    2015 and May 2017  POLYPECTOMY     SHOULDER SURGERY     bilateral   TOTAL KNEE ARTHROPLASTY Right 04/02/2016   Procedure: RIGHT TOTAL KNEE ARTHROPLASTY;  Surgeon: Lamar Collet, MD;  Location: [facility];  Service: Orthopedics;  Laterality: Right;  Adductor Block   UPPER GASTROINTESTINAL ENDOSCOPY      Family History: Family History  Problem Relation Age of Onset   Breast cancer Mother    Heart disease Mother    Liver cancer Father    Prostate cancer Father    Prostate cancer Maternal Uncle    Prostate cancer Maternal Uncle    Prostate cancer Maternal Uncle    Colon cancer Cousin    Colon polyps Neg Hx    Esophageal cancer Neg Hx    Rectal cancer Neg Hx    Stomach cancer Neg Hx     Social History  reports that he has never smoked. He has never used smokeless tobacco. He reports that he does not drink alcohol  and does not use drugs.  Allergies  Allergen Reactions   Ciprofloxacin Other (See Comments)    chest pain   Diclofenac Sodium Other (See Comments)    made me feel bad  all over, Flu like symptoms/fever   Doxycycline  Other (See Comments)   Hydromorphone Itching   Lithium Benzoate [Lithium]     Dizziness and Passed Out   Nsaids     Other reaction(s): Unknown   Oxycodone -Acetaminophen  Itching    REACTION: itching   Pennsaid [Diclofenac Sodium] Other (See Comments)    made me feel bad all over Flu like symptoms/fever    Percocet [Oxycodone -Acetaminophen ]     unknown   Sulfamethoxazole-Trimethoprim Rash    septra    Medications   Current Facility-Administered Medications:    sodium chloride  flush (NS) 0.9 % injection 3 mL, 3 mL, Intravenous, Once, Jerrol Agent, MD  Current Outpatient Medications:    gentamicin  ointment (GARAMYCIN ) 0.1 %, Apply 1 Application topically 3 (three) times daily. (Patient not taking: Reported on 08/29/2023), Disp: 15 g, Rfl: 0   co-enzyme Q-10 30 MG capsule, Take 30 mg by mouth daily. , Disp: , Rfl:    Grape Seed OIL, by Does not apply route. 1 pill daily, Disp: , Rfl:    lansoprazole (PREVACID) 15 MG capsule, Take 15 mg by mouth daily., Disp: , Rfl:    levothyroxine  (SYNTHROID , LEVOTHROID) 112 MCG tablet, Take 112 mcg by mouth daily.  , Disp: , Rfl:    Multiple Vitamin (MULTIVITAMIN WITH MINERALS) TABS tablet, Take 1 tablet by mouth daily., Disp: , Rfl:    Nutritional Supplements (SALMON OIL) CAPS, Take 1 capsule by mouth daily., Disp: , Rfl:    Saw Palmetto, Serenoa repens, (SAW PALMETTO PO), Take 250 mg by mouth 2 (two) times daily. , Disp: , Rfl:   Vitals   Vitals:   01/11/24 0800  Weight: 94.6 kg    Body mass index is 31.71 kg/m.   Physical Exam   Constitutional: Appears well-developed and well-nourished.   Psych: Affect appropriate to situation.   Eyes: No scleral injection.   HENT: No OP obstruction.   Head: Normocephalic.   Cardiovascular: Normal rate and regular rhythm.   Respiratory: Effort normal, non-labored breathing.   GI: Soft.  No distension. There is no tenderness.   Skin: WDI.     Neurologic Examination   Mental Status -  Aphasia and Word finding difficulties at baseline. Unable to answer orientation questions and name some objects. No dysarthria   Cranial Nerves  II - XII - II - Visual field intact OU . III, IV, VI - Extraocular movements intact . V - Facial sensation intact bilaterally . VII - Facial movement intact bilaterally . VIII - Hearing & vestibular intact bilaterally . X - Palate elevates symmetrically . XI - Chin turning & shoulder shrug intact bilaterally . XII - Tongue protrusion intact .  Motor Strength - The patient's strength was normal in all extremities and pronator drift was absent.  Bulk was normal and fasciculations were absent .   Motor Tone - Muscle tone was assessed at the neck and appendages and was normal . Sensory - Light touch, temperature/pinprick were assessed and were symmetrical.   Coordination - The patient had normal movements in the hands and feet with no ataxia or dysmetria.  Tremor was absent. Gait and Station - deferred.  Labs/Imaging/Neurodiagnostic studies   CBC: No results for input(s): WBC, NEUTROABS, HGB, HCT, MCV, PLT in the last 168 hours. Basic Metabolic Panel:  Lab Results  Component Value Date   NA 138 08/18/2022   K 3.5 08/18/2022   CO2 26 08/18/2022   GLUCOSE 77 08/18/2022   BUN 15 08/18/2022   CREATININE 0.84 08/18/2022   CALCIUM  8.9 08/18/2022   GFRNONAA >60 08/18/2022   GFRAA >60 04/05/2016   Lipid Panel:  Lab Results  Component Value Date   LDLCALC 55 08/18/2022   HgbA1c:  Lab Results  Component Value Date   HGBA1C 5.8 (H) 08/18/2022   Urine Drug Screen:     Component Value Date/Time   LABOPIA NONE DETECTED 08/17/2022 1735   COCAINSCRNUR NONE DETECTED 08/17/2022 1735   LABBENZ NONE DETECTED 08/17/2022 1735   AMPHETMU NONE DETECTED 08/17/2022 1735   THCU NONE DETECTED 08/17/2022 1735   LABBARB NONE DETECTED 08/17/2022 1735    Alcohol  Level     Component Value  Date/Time   ETH <10 08/17/2022 1558   INR  Lab Results  Component Value Date   INR 1.0 08/17/2022   APTT  Lab Results  Component Value Date   APTT 32 08/17/2022   AED levels: No results found for: PHENYTOIN, ZONISAMIDE, LAMOTRIGINE, LEVETIRACETA  CT Head without contrast(Personally reviewed): No acute process   ASSESSMENT   Eric Case is a 78 y.o. male hx of TIA, CAA, memory loss GERD, hypothyroidism, migraines, who presents to [facility] via EMS for acute onset decreased sensation on face and aphasia. EMS activated a code stroke. Spoke with wife, Eric Case via phone and she states LKW 0730. She states they were walking down the driveway when he suddenly kept leaning forward and stumbling a little. They turned around and he continued to lean forward while walking then started to sit on the ground  RECOMMENDATIONS  -Will check routine EEG to evaluate for seizures  - orthostatic vitals. - Evaluate for infection  ______________________________________________________________________    Signed, Karna DELENA Geralds, NP [facility]   NEUROHOSPITALIST ADDENDUM Performed a face to face diagnostic evaluation.   I have reviewed the contents of history and physical exam as documented by PA/ARNP/Resident and agree with above documentation.  I have discussed and formulated the above plan as documented. Edits to the note have been made as needed.  Impression/Key exam findings/Plan: Noted aphasia today is baseline for him per discussion with wife Eric Case. With his known hx of CAA, tnkase is contraindicated and not offered. Low suspicion for stroke and therefore CTA head and neck not obtained. I dont think he is going to benefit from MRI  brain. Even if he does have a new stroke, he is not going to be a candidate for antiplatelet or anticoagulation with his known hx of CAA. The episode today with him leaning forward and sitting down on the driveway also is not very concerning for  stroke.  Will get rEEG, check orthostatics. Rule out infections.  Keven Soucy, MD [facility] [phone]   If 7pm to 7am, please call on call as listed on AMION.

## 2024-01-11 NOTE — Discharge Instructions (Signed)
Please follow-up with outpatient neurology

## 2024-01-11 NOTE — Progress Notes (Signed)
 EEG complete - results pending

## 2024-01-11 NOTE — ED Notes (Signed)
 EEG at bedside.

## 2024-01-11 NOTE — Code Documentation (Addendum)
 Stroke Response Nurse Documentation Code Documentation  Eric Case is a 78 y.o. male arriving to [facility]  via [facility] on 01/11/2024 with past medical hx of cerebral amyloid angiopathy, memory loss, migraines. On No antithrombotic. Code stroke was activated by EMS.   Patient from home where he was LKW at 0740 and now complaining of aphasia and loss of balance.   Stroke team at the bedside on patient arrival. Labs drawn and patient cleared for CT by Dr. Jerrol. Patient to CT with team. NIHSS 3, see documentation for details and code stroke times. Patient with disoriented and Expressive aphasia  on exam. The following imaging was completed:  CT Head. Patient is not a candidate for IV Thrombolytic due to history of CAA. Patient is not a candidate for IR due to LVO not suspected, aphasia chronic finding.   Care Plan:   VS and NIHSS q 2 for 12 hours, then q 4.  Bedside handoff with ED RN Hannie.    Gracelin Weisberg Livengood  Stroke Response RN

## 2024-01-16 DIAGNOSIS — R2689 Other abnormalities of gait and mobility: Secondary | ICD-10-CM | POA: Diagnosis not present

## 2024-01-16 DIAGNOSIS — K080 Exfoliation of teeth due to systemic causes: Secondary | ICD-10-CM | POA: Diagnosis not present

## 2024-02-06 DIAGNOSIS — K080 Exfoliation of teeth due to systemic causes: Secondary | ICD-10-CM | POA: Diagnosis not present

## 2024-02-20 DIAGNOSIS — I10 Essential (primary) hypertension: Secondary | ICD-10-CM | POA: Diagnosis not present

## 2024-02-23 DIAGNOSIS — K080 Exfoliation of teeth due to systemic causes: Secondary | ICD-10-CM | POA: Diagnosis not present

## 2024-03-28 ENCOUNTER — Other Ambulatory Visit: Payer: Self-pay | Admitting: Urology

## 2024-03-28 ENCOUNTER — Encounter: Payer: Self-pay | Admitting: Urology

## 2024-03-28 DIAGNOSIS — N401 Enlarged prostate with lower urinary tract symptoms: Secondary | ICD-10-CM

## 2024-03-28 DIAGNOSIS — Z8042 Family history of malignant neoplasm of prostate: Secondary | ICD-10-CM

## 2024-03-28 DIAGNOSIS — R350 Frequency of micturition: Secondary | ICD-10-CM

## 2024-03-28 DIAGNOSIS — R351 Nocturia: Secondary | ICD-10-CM

## 2024-03-28 DIAGNOSIS — R972 Elevated prostate specific antigen [PSA]: Secondary | ICD-10-CM

## 2024-04-11 ENCOUNTER — Ambulatory Visit: Admitting: Neurology

## 2024-04-11 ENCOUNTER — Encounter: Payer: Self-pay | Admitting: Neurology

## 2024-04-11 VITALS — BP 122/74 | HR 88 | Ht 68.0 in | Wt 208.6 lb

## 2024-04-11 DIAGNOSIS — R2990 Unspecified symptoms and signs involving the nervous system: Secondary | ICD-10-CM | POA: Diagnosis not present

## 2024-04-11 DIAGNOSIS — I680 Cerebral amyloid angiopathy: Secondary | ICD-10-CM | POA: Insufficient documentation

## 2024-04-11 DIAGNOSIS — F0150 Vascular dementia without behavioral disturbance: Secondary | ICD-10-CM

## 2024-04-11 DIAGNOSIS — R7303 Prediabetes: Secondary | ICD-10-CM | POA: Insufficient documentation

## 2024-04-11 MED ORDER — MEMANTINE HCL 10 MG PO TABS: 10.0000 mg | ORAL_TABLET | Freq: Two times a day (BID) | ORAL | 3 refills | Status: AC

## 2024-04-11 NOTE — Progress Notes (Signed)
 " Eric Case [address] Eric Case. Eric Case [id number]       OFFICE FOLLOW-UP NOTE  Eric Case Date of Birth:  Aug 29, 1945 Medical Record Number:  [id number]   HPI: Eric Case is a 79 year old pleasant Caucasian male seen today for office follow-up visit after last visit with Eric Case nurse practitioner in June 2025.  He is accompanied by his wife.  History is obtained from them and review of electronic medical records and I personally reviewed pertinent available imaging in PACS.  He has past medical  hx of TIA, CAA, memory loss GERD, hypothyroidism, migraines, who presents to [facility] via EMS on 01/11/2024 for acute onset decreased sensation on face and aphasia. EMS activated a code stroke. Spoke with wife, Eric Case via phone and she states [id number]. She states they were walking down the driveway when he suddenly kept leaning forward and stumbling a little. They turned around and he continued to lean forward while walking then started to sit on the ground. She called EMS because he seemed to not be acting himself. She states that he has word finding issues at baseline and has trouble communicating. She did not see a facial droop, no slurred speech or weakness. NIHSS 3 CT head with no acute process Patient was not a candidate for IV Thrombolytic due to history of CAA. Patient is not a candidate for IR due to LVO not suspected, aphasia chronic finding. .  Unclear whether this episode constituted a TIA or not.  The patient however has a diagnosis of cerebral amyloid angiopathy and has previously had transient focal neurological episodes, transient speech and word finding difficulties in December 2023, May 2024 and November 2024.  These episodes-has likely been amyloid spells.  The patient continues to have significant cognitive impairment and memory difficulties.  He has frequent word finding difficulties and at times struggles and cannot assist.  He gets confused quite easily.   He is still mostly in self-care.  He does not exhibit any sedation, violent behavior or wandering.  His wife provides 24-hour care and keeps a close watch on him.  He has not tried medications like Aricept or Namenda .  Testing today scored 12/30 he was unable to draw clock or copy intersecting pentagons.  He was able to name only 1 animals which can walk on 4 legs.   Prior Visit  6/11/25/2023 Eric [name], NP ) : Returns for follow up visit accompanied by his wife.  No recurrent episodes of aphasia or any other new stroke/TIA symptoms. Does have occasional difficulty finding the correct word but denies any worsening. Reports cognition has been over stable since prior visit. Wife mentions some increased difficulty at times with certain tasks such as getting a bowl of cereal (remembering to get all the items needed such as bowl, spoon, cereal and milk) or brushing his teeth (will grab shaving creams instead of the toothpaste) but eventually is able to figure it out.  Continues to maintain ADLs independently. No longer driving due to concern of recurrent transient event while driving. No behavioral concerns, no changes in personality. MOCA toady 6/30 (prior 12/30) - wife believes this test is not accurate at his actual cognitive impairment due to difficulty coming up with the right word (naming animals), he knows what it is but has difficulty getting it out.  He recently took a trip to [facility] and remember the directions on how to get there (wife was driving).  Reports he sleeps well and  has a good appetite.  Blood pressure well-controlled.  Routinely follows with PCP. Prior visit: 02/09/2023   Brief HPI:   Eric Case is a 79 y.o. male who is being followed by Dr. Rush for memory loss since 01/2021 after COVID likely in setting of dementia.  MRI brain 11/2021 showed moderate to severe atrophy, chronic small vessel disease and cerebral microhemorrhages felt to be associated with chronic small vessel ischemic  disease.  He also noted imbalance with exam showing decreased pinprick and vibration to bilateral feet likely contributing, A1c 6.0, B12 WNL. Had another MRI 02/2022 which showed an 11 mm focus of hemosiderin which was new from MRI in September, concerning for cerebral amyloid angiopathy.  Possible TIA vs amyloid spell 07/2022 after episode of transient expressive aphasia.  72-hour EEG no evidence of seizures.   At prior visit, reported recurrent transient aphasia 1 week prior to visit lasted about 30 minutes, no other associated symptoms.  Discussed trial of ASM but declined interest as episodes infrequent. ROS:   14 system review of systems is positive for memory loss, speech difficulties, word finding difficulties, gait difficulty, confusion, disorientation and all other systems negative.  PMH:  Past Medical History:  Diagnosis Date   Anemia    on Iron- comes and goes per patient    Arthritis    Cataract    starting but very small- RIGHT REMOVED   Cerebral amyloid angiopathy (HCC)    Complication of anesthesia    severe headache after esophagus stretched years ago   Diverticulitis    Diverticulosis    GERD (gastroesophageal reflux disease)    Hemorrhoids    Hiatal hernia    Hx of adenomatous colonic polyps    Hypothyroidism    Migraine, unspecified, not intractable, without status migrainosus    Peptic stricture of esophagus     Social History:  Social History   Socioeconomic History   Marital status: Married    Spouse name: Eric Case   Number of children: 1   Years of education: Not on file   Highest education level: Associate degree: occupational, scientist, product/process development, or vocational program  Occupational History   Occupation: carpenter-retired  Tobacco Use   Smoking status: Never   Smokeless tobacco: Never  Vaping Use   Vaping status: Never Used  Substance and Sexual Activity   Alcohol  use: No    Alcohol /week: 0.0 standard drinks of alcohol    Drug use: No   Sexual activity: Not  on file  Other Topics Concern   Not on file  Social History Narrative   11/09/21 Live with wife   Daily caffeine   Social Drivers of Health   Tobacco Use: Low Risk (04/11/2024)   Patient History    Smoking Tobacco Use: Never    Smokeless Tobacco Use: Never    Passive Exposure: Not on file  Financial Resource Strain: Not on file  Food Insecurity: Patient Declined (08/17/2022)   Hunger Vital Sign    Worried About Running Out of Food in the Last Year: Patient declined    Ran Out of Food in the Last Year: Patient declined  Transportation Needs: No Transportation Needs (08/17/2022)   PRAPARE - Administrator, Civil Service (Medical): No    Lack of Transportation (Non-Medical): No  Physical Activity: Not on file  Stress: Not on file  Social Connections: Not on file  Intimate Partner Violence: Not At Risk (08/17/2022)   Humiliation, Afraid, Rape, and Kick questionnaire    Fear of Current  or Ex-Partner: No    Emotionally Abused: No    Physically Abused: No    Sexually Abused: No  Depression (PHQ2-9): Not on file  Alcohol  Screen: Not on file  Housing: Low Risk (08/17/2022)   Housing    Last Housing Risk Score: 0  Utilities: Not At Risk (08/17/2022)   AHC Utilities    Threatened with loss of utilities: No  Health Literacy: Not on file    Medications:  Medications Ordered Prior to Encounter[1]  Allergies:  Allergies[2]  Physical Exam General: well developed, well nourished pleasant elderly Caucasian male, seated, in no evident distress Head: head normocephalic and atraumatic.  Neck: supple with no carotid or supraclavicular bruits Cardiovascular: regular rate and rhythm, no murmurs Musculoskeletal: no deformity Skin:  no rash/petichiae Vascular:  Normal pulses all extremities Vitals:   04/11/24 0939  BP: 122/74  Pulse: 88  SpO2: 99%   Neurologic Exam Mental Status: Awake and fully alert. Oriented to person only. Recent and remote memory poor attention span,  concentration and fund of knowledge diminished. Mood and affect appropriate.  MMSE score 12/30.  Unable to copy pentagons or draw clock.  Able to name only 1 animal Cranial Nerves: Fundoscopic exam reveals sharp disc margins. Pupils equal, briskly reactive to light. Extraocular movements full without nystagmus. Visual fields full to confrontation. Hearing intact. Facial sensation intact. Face, tongue, palate moves normally and symmetrically.  Motor: Normal bulk and tone. Normal strength in all tested extremity muscles. Sensory.: intact to touch ,pinprick .position and vibratory sensation.  Coordination: Rapid alternating movements normal in all extremities. Finger-to-nose and heel-to-shin performed accurately bilaterally. Gait and Station: Arises from chair with slight difficulty. Stance is slightly stooped.  Mild apraxia with turns.  Gait is slightly broad-based but steady.  Unable to tandem walking Reflexes: 1+ and symmetric. Toes downgoing.        04/11/2024   10:13 AM  MMSE - Mini Mental State Exam  Orientation to time 3  Orientation to Place 2  Registration 3  Attention/ Calculation 0  Recall 0  Language- name 2 objects 0  Language- repeat 0  Language- follow 3 step command 3  Language- read & follow direction 1  Write a sentence 0  Copy design 0  Total score 12       08/29/2023   11:19 AM 02/09/2023    2:15 PM 06/16/2022    3:55 PM 03/16/2022    8:12 AM 11/09/2021    1:26 PM  Montreal Cognitive Assessment   Visuospatial/ Executive (0/5) 0 0 0 0 2  Naming (0/3) 0 1 1 2 1   Attention: Read list of digits (0/2) 1 2 2 1 1   Attention: Read list of letters (0/1) 0 0 1 1 1   Attention: Serial 7 subtraction starting at 100 (0/3) 0 0 1 1 2   Language: Repeat phrase (0/2) 1 1 2 2 1   Language : Fluency (0/1) 0 0 1 1 0  Abstraction (0/2) 1 2 1 2 2   Delayed Recall (0/5) 0 1 0 0 0  Orientation (0/6) 3 5 4 4 4   Total 6 12 13 14 14   Adjusted Score (based on education)   13 14        ASSESSMENT: 79 year old Caucasian male with cerebral amyloid angiopathy and vascular dementia and history of amyloid spells with transie12/2023, 07/2022 and 01/2023 nt speech difficulties in   Recent episode of transient weakness and October 2025 of unclear etiology.     PLAN:I had a long discussion  with the patient and his wife regarding his diagnosis of vascular dementia from cerebral amyloid angiopathy and episodes of transient neurological deficits which are likely amyloid spells.  I recommend a trial of Namenda  starting with 5 mg at night for 2 weeks and then 5 mg twice daily for 2 weeks and then 5 mg in the morning and 10 mg at night for 2 weeks milligrams twice daily as tolerated.  I discussed possible side effects with them and answered questions.  Recommend strict control of hypertension with blood pressure goal below 140/90.  Keep upcoming scheduled follow-up appointment with [facility] nurse practitioner in March 2026 or call earlier if necessary.    I personally spent a total of 50 minutes in the care of the patient today including getting/reviewing separately obtained history, performing a medically appropriate exam/evaluation, counseling and educating, placing orders, referring and communicating with other health care professionals, documenting clinical information in the EHR, independently interpreting results, and coordinating care.       Eather Popp, MD  Note: This document was prepared with digital dictation and possible smart phrase technology. Any transcriptional errors that result from this process are unintentional     [1]  Current Outpatient Medications on File Prior to Visit  Medication Sig Dispense Refill   co-enzyme Q-10 30 MG capsule Take 30 mg by mouth daily.      gentamicin  ointment (GARAMYCIN ) 0.1 % Apply 1 Application topically 3 (three) times daily. (Patient not taking: Reported on 04/11/2024) 15 g 0   lansoprazole (PREVACID) 15 MG capsule Take 15 mg by mouth  daily.     levothyroxine  (SYNTHROID , LEVOTHROID) 112 MCG tablet Take 112 mcg by mouth daily.       Multiple Vitamin (MULTIVITAMIN WITH MINERALS) TABS tablet Take 1 tablet by mouth daily.     Nutritional Supplements (SALMON OIL) CAPS Take 1 capsule by mouth daily.     Saw Palmetto, Serenoa repens, (SAW PALMETTO PO) Take 250 mg by mouth 2 (two) times daily.      Grape Seed OIL by Does not apply route. 1 pill daily (Patient not taking: Reported on 04/11/2024)     No current facility-administered medications on file prior to visit.  [2]  Allergies Allergen Reactions   Ciprofloxacin Other (See Comments)    chest pain   Diclofenac Sodium Other (See Comments)    made me feel bad all over, Flu like symptoms/fever   Doxycycline  Other (See Comments)   Hydromorphone Itching   Lithium Benzoate [Lithium]     Dizziness and Passed Out   Nsaids     Other reaction(s): Unknown   Oxycodone -Acetaminophen  Itching    REACTION: itching   Pennsaid [Diclofenac Sodium] Other (See Comments)    made me feel bad all over Flu like symptoms/fever    Percocet [Oxycodone -Acetaminophen ]     unknown   Sulfamethoxazole-Trimethoprim Rash    septra   "

## 2024-04-11 NOTE — Patient Instructions (Signed)
 I had a long discussion with the patient and his wife regarding his diagnosis of vascular dementia from cerebral amyloid angiopathy and episodes of transient neurological deficits which are likely amyloid spells.  I recommend a trial of Namenda  starting with 5 mg at night for 2 weeks and then 5 mg twice daily for 2 weeks and then 5 mg in the morning and 10 mg at night for 2 weeks milligrams twice daily as tolerated.  I discussed possible side effects with them and answered questions.  Recommend strict control of hypertension with blood pressure goal below 140/90.  Keep upcoming scheduled follow-up appointment with [facility] nurse practitioner in March 2026 or call earlier if necessary.  NAMENDA  (Memantine  )Tablets What is this medication? MEMANTINE  (MEM an teen) treats memory loss and confusion (dementia) in people who have Alzheimer disease. It works by improving attention, memory, and the ability to engage in daily activities. It is not a cure for dementia or Alzheimer disease. This medicine may be used for other purposes; ask your health care provider or pharmacist if you have questions. COMMON BRAND NAME(S): Namenda  What should I tell my care team before I take this medication? They need to know if you have any of these conditions: Kidney disease Liver disease Seizures Trouble passing urine An unusual or allergic reaction to memantine , other medications, foods, dyes, or preservatives Pregnant or trying to get pregnant Breast-feeding How should I use this medication? Take this medication by mouth with water . Follow the directions on the prescription label. You may take this medication with or without food. Take your doses at regular intervals. Do not take your medication more often than directed. Continue to take your medication even if you feel better. Do not stop taking except on the advice of your care team. Talk to your care team about the use of this medication in children. Special care may  be needed Overdosage: If you think you have taken too much of this medicine contact a poison control center or emergency room at once. NOTE: This medicine is only for you. Do not share this medicine with others. What if I miss a dose? If you miss a dose, take it as soon as you can. If it is almost time for your next dose, take only that dose. Do not take double or extra doses. If you do not take your medication for several days, contact your care team. Your dose may need to be changed. What may interact with this medication? Acetazolamide Amantadine Cimetidine Dextromethorphan  Dofetilide Hydrochlorothiazide Ketamine Metformin Methazolamide Quinidine Ranitidine Sodium bicarbonate Triamterene This list may not describe all possible interactions. Give your health care provider a list of all the medicines, herbs, non-prescription drugs, or dietary supplements you use. Also tell them if you smoke, drink alcohol , or use illegal drugs. Some items may interact with your medicine. What should I watch for while using this medication? Visit your care team for regular checks on your progress. Check with your care team if there is no improvement in your symptoms or if they get worse. This medication may affect your coordination, reaction time, or judgment. Do not drive or operate machinery until you know how this medication affects you. Sit up or stand slowly to reduce the risk of dizzy or fainting spells. Drinking alcohol  with this medication can increase the risk of these side effects. What side effects may I notice from receiving this medication? Side effects that you should report to your care team as soon as possible: Allergic reactions--skin  rash, itching, hives, swelling of the face, lips, tongue, or throat Side effects that usually do not require medical attention (report to your care team if they continue or are bothersome): Confusion Constipation Diarrhea Dizziness Headache This list may  not describe all possible side effects. Call your doctor for medical advice about side effects. You may report side effects to FDA at 1-800-FDA-1088. Where should I keep my medication? Keep out of the reach of children. Store at room temperature between 15 degrees and 30 degrees C (59 degrees and 86 degrees F). Throw away any unused medication after the expiration date. NOTE: This sheet is a summary. It may not cover all possible information. If you have questions about this medicine, talk to your doctor, pharmacist, or health care provider.  2024 Elsevier/Gold Standard (2021-03-31 00:00:00)

## 2024-04-12 ENCOUNTER — Inpatient Hospital Stay: Admitting: Neurology

## 2024-04-27 ENCOUNTER — Encounter: Payer: Self-pay | Admitting: Urology

## 2024-05-04 ENCOUNTER — Other Ambulatory Visit

## 2024-06-19 ENCOUNTER — Ambulatory Visit: Admitting: Neurology

## 2024-07-02 ENCOUNTER — Ambulatory Visit: Admitting: Adult Health
